# Patient Record
Sex: Male | Born: 1950 | Race: White | Hispanic: No | Marital: Married | Smoking: Never smoker
Health system: Southern US, Community
[De-identification: ages and names within clinical notes are randomized; demographics above are authoritative.]

## PROBLEM LIST (undated history)

## (undated) DIAGNOSIS — E785 Hyperlipidemia, unspecified: Secondary | ICD-10-CM

## (undated) DIAGNOSIS — I10 Essential (primary) hypertension: Secondary | ICD-10-CM

## (undated) DIAGNOSIS — C61 Malignant neoplasm of prostate: Secondary | ICD-10-CM

## (undated) HISTORY — DX: Hyperlipidemia, unspecified: E78.5

## (undated) HISTORY — DX: Malignant neoplasm of prostate: C61

## (undated) HISTORY — DX: Essential (primary) hypertension: I10

---

## 1996-09-15 HISTORY — PX: WRIST SURGERY: SHX841

## 2004-09-15 HISTORY — PX: COLONOSCOPY: SHX174

## 2005-09-15 HISTORY — PX: CERVICAL FUSION: SHX112

## 2006-04-23 ENCOUNTER — Encounter: Payer: Self-pay | Admitting: Neurosurgery

## 2006-05-08 ENCOUNTER — Ambulatory Visit (HOSPITAL_COMMUNITY): Admission: RE | Admit: 2006-05-08 | Discharge: 2006-05-09 | Payer: Self-pay | Admitting: Neurosurgery

## 2006-09-15 DIAGNOSIS — C61 Malignant neoplasm of prostate: Secondary | ICD-10-CM

## 2006-09-15 HISTORY — DX: Malignant neoplasm of prostate: C61

## 2007-06-21 ENCOUNTER — Ambulatory Visit: Payer: Self-pay | Admitting: Internal Medicine

## 2007-06-25 ENCOUNTER — Encounter: Payer: Self-pay | Admitting: Internal Medicine

## 2007-06-25 ENCOUNTER — Encounter (INDEPENDENT_AMBULATORY_CARE_PROVIDER_SITE_OTHER): Payer: Self-pay | Admitting: *Deleted

## 2007-06-25 LAB — CONVERTED CEMR LAB
Calcium: 9.6 mg/dL (ref 8.4–10.5)
Creatinine, Ser: 1 mg/dL (ref 0.4–1.5)
Potassium: 3.7 meq/L (ref 3.5–5.1)
Sodium: 141 meq/L (ref 135–145)

## 2007-06-28 ENCOUNTER — Encounter (INDEPENDENT_AMBULATORY_CARE_PROVIDER_SITE_OTHER): Payer: Self-pay | Admitting: *Deleted

## 2007-07-15 ENCOUNTER — Encounter (INDEPENDENT_AMBULATORY_CARE_PROVIDER_SITE_OTHER): Payer: Self-pay | Admitting: *Deleted

## 2007-07-16 ENCOUNTER — Encounter: Payer: Self-pay | Admitting: Internal Medicine

## 2007-07-17 ENCOUNTER — Encounter: Payer: Self-pay | Admitting: Internal Medicine

## 2007-08-02 ENCOUNTER — Ambulatory Visit: Payer: Self-pay | Admitting: Internal Medicine

## 2007-08-02 DIAGNOSIS — I152 Hypertension secondary to endocrine disorders: Secondary | ICD-10-CM | POA: Insufficient documentation

## 2007-08-02 DIAGNOSIS — I10 Essential (primary) hypertension: Secondary | ICD-10-CM | POA: Insufficient documentation

## 2007-08-02 DIAGNOSIS — R9431 Abnormal electrocardiogram [ECG] [EKG]: Secondary | ICD-10-CM | POA: Insufficient documentation

## 2007-08-09 ENCOUNTER — Encounter: Payer: Self-pay | Admitting: Internal Medicine

## 2007-08-18 ENCOUNTER — Telehealth: Payer: Self-pay | Admitting: Internal Medicine

## 2007-08-19 ENCOUNTER — Ambulatory Visit: Payer: Self-pay | Admitting: Cardiology

## 2007-08-23 ENCOUNTER — Ambulatory Visit (HOSPITAL_COMMUNITY): Admission: RE | Admit: 2007-08-23 | Discharge: 2007-08-23 | Payer: Self-pay | Admitting: Urology

## 2007-08-30 ENCOUNTER — Ambulatory Visit: Payer: Self-pay

## 2007-08-30 ENCOUNTER — Encounter: Payer: Self-pay | Admitting: Internal Medicine

## 2007-08-30 ENCOUNTER — Encounter: Payer: Self-pay | Admitting: Cardiology

## 2007-11-14 HISTORY — PX: PROSTATECTOMY: SHX69

## 2009-01-09 ENCOUNTER — Ambulatory Visit: Payer: Self-pay | Admitting: Internal Medicine

## 2009-01-09 DIAGNOSIS — Z8546 Personal history of malignant neoplasm of prostate: Secondary | ICD-10-CM

## 2009-01-09 DIAGNOSIS — E785 Hyperlipidemia, unspecified: Secondary | ICD-10-CM

## 2009-01-09 DIAGNOSIS — E1169 Type 2 diabetes mellitus with other specified complication: Secondary | ICD-10-CM | POA: Insufficient documentation

## 2009-01-09 LAB — CONVERTED CEMR LAB
ALT: 47 units/L (ref 0–53)
Basophils Absolute: 0 10*3/uL (ref 0.0–0.1)
Eosinophils Absolute: 0.1 10*3/uL (ref 0.0–0.7)
Eosinophils Relative: 2.2 % (ref 0.0–5.0)
GFR calc non Af Amer: 92.19 mL/min (ref >60)
HCT: 47.9 % (ref 39.0–52.0)
Hemoglobin: 16.6 g/dL (ref 13.0–17.0)
MCHC: 34.6 g/dL (ref 30.0–36.0)
Monocytes Absolute: 0.6 10*3/uL (ref 0.1–1.0)
Monocytes Relative: 10 % (ref 3.0–12.0)
Neutrophils Relative %: 64.9 % (ref 43.0–77.0)
Platelets: 233 10*3/uL (ref 150.0–400.0)
RBC: 5.7 M/uL (ref 4.22–5.81)
TSH: 0.81 microintl units/mL (ref 0.35–5.50)

## 2009-01-10 ENCOUNTER — Encounter (INDEPENDENT_AMBULATORY_CARE_PROVIDER_SITE_OTHER): Payer: Self-pay | Admitting: *Deleted

## 2010-01-18 ENCOUNTER — Ambulatory Visit: Payer: Self-pay | Admitting: Internal Medicine

## 2010-01-18 DIAGNOSIS — R059 Cough, unspecified: Secondary | ICD-10-CM | POA: Insufficient documentation

## 2010-01-18 DIAGNOSIS — R05 Cough: Secondary | ICD-10-CM

## 2010-01-21 ENCOUNTER — Ambulatory Visit: Payer: Self-pay | Admitting: Internal Medicine

## 2010-01-27 LAB — CONVERTED CEMR LAB
ALT: 76 units/L — ABNORMAL HIGH (ref 0–53)
Albumin: 4.1 g/dL (ref 3.5–5.2)
Bilirubin, Direct: 0.1 mg/dL (ref 0.0–0.3)
Eosinophils Relative: 4.8 % (ref 0.0–5.0)
HCT: 46.8 % (ref 39.0–52.0)
HDL: 41.6 mg/dL (ref >39.00)
Hemoglobin: 16.3 g/dL (ref 13.0–17.0)
Lymphocytes Relative: 23.7 % (ref 12.0–46.0)
Lymphs Abs: 1.4 10*3/uL (ref 0.7–4.0)
MCV: 85.2 fL (ref 78.0–100.0)
Monocytes Absolute: 0.6 10*3/uL (ref 0.1–1.0)
Monocytes Relative: 10.3 % (ref 3.0–12.0)
Neutrophils Relative %: 61 % (ref 43.0–77.0)
TSH: 0.67 microintl units/mL (ref 0.35–5.50)
Total CHOL/HDL Ratio: 5
Triglycerides: 219 mg/dL — ABNORMAL HIGH (ref 0.0–149.0)
VLDL: 43.8 mg/dL — ABNORMAL HIGH (ref 0.0–40.0)
WBC: 5.9 10*3/uL (ref 4.5–10.5)

## 2010-10-15 NOTE — Assessment & Plan Note (Signed)
Summary: CPX//PH   Vital Signs:  Patient profile:   60 year old male Height:      69 inches Weight:      234.8 pounds BMI:     34.80 Temp:     98.3 degrees F oral Pulse rate:   64 / minute Resp:     14 per minute BP sitting:   114 / 68  (left arm) Cuff size:   large  Vitals Entered By: Shonna Chock (Jan 18, 2010 2:07 PM)  Comments REVIEWED MED LIST, PATIENT AGREED DOSE AND INSTRUCTION CORRECT    History of Present Illness: Johnny [name] is here for a physical; he is asymptomatic.  Preventive Screening-Counseling & Management  Caffeine-Diet-Exercise     Does Patient Exercise: yes  Allergies (verified): No Known Drug Allergies  Past History:  Past Medical History: Hypertension Rosacea, Dr Inda Merlin Prostate cancer, hx of, Dr [facility] Abnormal EKG, negative Nuclear Stress Test , Dr Jens Som 2008 Hyperlipidemia: NMR : LDL 110(1302/727), TG 161, HDL 42. LDL goal = < 115,ideally <90.  Past Surgical History: Cervical fusion @ C 5-7 , Dr Konrad Dolores bone surgery 1998 Colonoscopy 2006 negative ,[facility] Prostatectomy 11/2007, Dr Carin Primrose, no post op treatment  Family History: Father: prostate nodule Mother: breast  cancer Siblings: none; PGM colon  cancer; PGF DM; MGM breast cancer; MGF MI @ 60  Social History: no diet  Occupation: Stage manager Married Never Smoked Alcohol use-no Regular exercise-yes: walking 3-4X/week 30 min Does Patient Exercise:  yes  Review of Systems  The patient denies anorexia, fever, weight loss, weight gain, vision loss, decreased hearing, hoarseness, chest pain, syncope, dyspnea on exertion, peripheral edema, headaches, hemoptysis, abdominal pain, melena, hematochezia, severe indigestion/heartburn, hematuria, incontinence, suspicious skin lesions, depression, unusual weight change, abnormal bleeding, enlarged lymph nodes, and angioedema.         Dry cough ; he is on an ACE-I.  Physical Exam  General:   well-nourished; alert,appropriate and cooperative throughout examination Head:  Normocephalic and atraumatic without obvious abnormalities. No apparent alopecia Eyes:  No corneal or conjunctival inflammation noted. Perrla. Funduscopic exam benign, without hemorrhages, exudates or papilledema. Ears:  External ear exam shows no significant lesions or deformities.  Otoscopic examination reveals clear canals, tympanic membranes are intact bilaterally without bulging, retraction, inflammation or discharge. Hearing is grossly normal bilaterally. Nose:  External nasal examination shows no deformity or inflammation. Nasal mucosa are pink and moist without lesions or exudates. Septum to R Mouth:  Oral mucosa and oropharynx without lesions or exudates.  Teeth in good repair. Neck:  No deformities, masses, or tenderness noted. Lungs:  Normal respiratory effort, chest expands symmetrically. Lungs are clear to auscultation, no crackles or wheezes. Heart:  Normal rate and regular rhythm. S1 and S2 normal without gallop, murmur, click, rub .S4 Abdomen:  Bowel sounds positive,abdomen soft and non-tender without masses, organomegaly or hernias noted. Genitalia:  Dr Carin Primrose (seen every 6 months, last 12/20/2009) Prostate:  Dr Carin Primrose Msk:  No deformity or scoliosis noted of thoracic or lumbar spine.   Pulses:  R and L carotid,radial,dorsalis pedis and posterior tibial pulses are full and equal bilaterally Extremities:  No clubbing, cyanosis, edema, or deformity noted with normal full range of motion of all joints.  Minor crepitus of knees  Neurologic:  alert & oriented X3 and DTRs symmetrical and 1/2+ Skin:  Intact without suspicious lesions or rashes Cervical Nodes:  No lymphadenopathy noted Axillary Nodes:  No palpable lymphadenopathy Psych:  memory intact for recent  and remote, normally interactive, and good eye contact.     Impression & Recommendations:  Problem # 1:  ROUTINE GENERAL MEDICAL EXAM@HEALTH  CARE  FACL (ICD-V70.0)  Orders: EKG w/ Interpretation (93000)  Problem # 2:  HYPERLIPIDEMIA (ICD-272.4)  Problem # 3:  ABNORMAL ELECTROCARDIOGRAM (ICD-794.31)  Improved since  01/09/2009   Orders: EKG w/ Interpretation (93000)  Problem # 4:  COUGH (ICD-786.2) Due to ACE-I  Problem # 5:  HYPERTENSION, ESSENTIAL NOS (ICD-401.9) Controlled.                                                                                                                                                                                                                   The following medications were removed from the medication list:    Benazepril Hcl 20 Mg Tabs (Benazepril hcl) .Marland Kitchen... 1 once daily His updated medication list for this problem includes:    Cardura 4 Mg Tabs (Doxazosin mesylate) .Marland Kitchen... 1/2 tab qd    Hydrochlorothiazide 12.5 Mg Caps (Hydrochlorothiazide) .Marland Kitchen... 1 qd    Losartan Potassium 100 Mg Tabs (Losartan potassium) .Marland Kitchen... 1 once daily  Problem # 6:  PROSTATE CANCER, HX OF (ICD-V10.46) as per Dr Carin Primrose  Complete Medication List: 1)  Cardura 4 Mg Tabs (Doxazosin mesylate) .... 1/2 tab qd 2)  Hydrochlorothiazide 12.5 Mg Caps (Hydrochlorothiazide) .Marland Kitchen.. 1 qd 3)  Oracea 40 Mg Cpdr (Doxycycline (rosacea)) .Marland Kitchen.. 1 by mouth once daily 4)  Losartan Potassium 100 Mg Tabs (Losartan potassium) .Marland Kitchen.. 1 once daily  Patient Instructions: 1)  Consume LESS THAN 40 grams of "sugar" from foods & drinks with High Fructose Corn Syrup as #1,2  or #3 on label. Schedule fastig labs:BMP ;Hepatic Panel ;Lipid Panel ;TSH ;CBC w/ Diff . 2)  Check your Blood Pressure regularly. Your goal = < 135/85 ON AVERAGE. Prescriptions: HYDROCHLOROTHIAZIDE 12.5 MG  CAPS (HYDROCHLOROTHIAZIDE) 1 qd  #90 Capsule x 3   Entered and Authorized by:   Marga Melnick MD   Signed by:   Marga Melnick MD on 01/18/2010   Method used:   Print then Give to Patient   RxID:   [id number] CARDURA 4 MG  TABS (DOXAZOSIN MESYLATE) 1/2 tab qd  #90 x  1   Entered and Authorized by:   Marga Melnick MD   Signed by:   Marga Melnick MD on 01/18/2010   Method used:   Print then Give to Patient   RxID:   [id number] LOSARTAN POTASSIUM 100 MG TABS (LOSARTAN POTASSIUM) 1 once daily  #30  x 5   Entered and Authorized by:   Marga Melnick MD   Signed by:   Marga Melnick MD on 01/18/2010   Method used:   Print then Give to Patient   RxID:   [id number]

## 2011-01-22 ENCOUNTER — Encounter: Payer: Self-pay | Admitting: Internal Medicine

## 2011-01-24 ENCOUNTER — Encounter: Payer: Self-pay | Admitting: Internal Medicine

## 2011-01-24 ENCOUNTER — Ambulatory Visit (INDEPENDENT_AMBULATORY_CARE_PROVIDER_SITE_OTHER): Payer: BC Managed Care – PPO | Admitting: Internal Medicine

## 2011-01-24 VITALS — BP 128/86 | HR 60 | Temp 98.5°F | Resp 12 | Ht 70.0 in | Wt 229.8 lb

## 2011-01-24 DIAGNOSIS — Z8546 Personal history of malignant neoplasm of prostate: Secondary | ICD-10-CM

## 2011-01-24 DIAGNOSIS — R7309 Other abnormal glucose: Secondary | ICD-10-CM

## 2011-01-24 DIAGNOSIS — E785 Hyperlipidemia, unspecified: Secondary | ICD-10-CM

## 2011-01-24 DIAGNOSIS — Z0000 Encounter for general adult medical examination without abnormal findings: Secondary | ICD-10-CM

## 2011-01-24 DIAGNOSIS — I10 Essential (primary) hypertension: Secondary | ICD-10-CM

## 2011-01-24 DIAGNOSIS — R7402 Elevation of levels of lactic acid dehydrogenase (LDH): Secondary | ICD-10-CM

## 2011-01-24 DIAGNOSIS — R9431 Abnormal electrocardiogram [ECG] [EKG]: Secondary | ICD-10-CM

## 2011-01-24 LAB — AST: AST: 36 U/L (ref 0–37)

## 2011-01-24 LAB — LIPID PANEL: Cholesterol: 190 mg/dL (ref 0–200)

## 2011-01-24 MED ORDER — DOXAZOSIN MESYLATE 4 MG PO TABS: 2.0000 mg | ORAL_TABLET | ORAL | Status: DC

## 2011-01-24 MED ORDER — HYDROCHLOROTHIAZIDE 12.5 MG PO CAPS: 12.5000 mg | ORAL_CAPSULE | Freq: Every day | ORAL | Status: DC

## 2011-01-24 MED ORDER — LOSARTAN POTASSIUM 100 MG PO TABS: 100.0000 mg | ORAL_TABLET | Freq: Every day | ORAL | Status: DC

## 2011-01-24 NOTE — Assessment & Plan Note (Signed)
NMR Lipoprofile 2010: LDL 110(1302/727), HDL 42, TG 161. LDL goal = <110. PGF  & MGF MI > 55.

## 2011-01-24 NOTE — Progress Notes (Signed)
  Subjective:    Patient ID: Johnny [name], male    DOB: 18-May-1951, 60 y.o.   MRN: [id number]  HPI Johnny [name] is here for a physical ; he is asymptomatic. labsdone @ [facility] 01/09/2011 : mild elevation LFTs; glucose 127(non fasting). PSA 0.3.    Review of Systems Patient reports no  vision/ hearing changes,anorexia, weight change, fever ,adenopathy, persistant / recurrent hoarseness, swallowing issues, chest pain,palpitations, edema,persistant / recurrent cough, hemoptysis, dyspnea(rest, exertional, paroxysmal nocturnal), gastrointestinal  bleeding (melena, rectal bleeding), abdominal pain, excessive heart burn, GU symptoms( dysuria, hematuria, pyuria, voiding/incontinence  issues) syncope, focal weakness, memory loss,numbness & tingling, skin/hair/nail changes,depression, anxiety, abnormal bruising/bleeding, musculoskeletal symptoms/signs.      Objective:   Physical Exam Gen.: Healthy and well-nourished in appearance. Alert, appropriate and cooperative throughout exam. Head: Normocephalic without obvious abnormalities;  no alopecia  Eyes: No corneal or conjunctival inflammation noted. Pupils equal round reactive to light and accommodation. Fundal exam is benign without hemorrhages, exudate, papilledema. Extraocular motion intact. Vision grossly normal. Ears: External  ear exam reveals no significant lesions or deformities. Canals clear .TMs normal. Hearing is grossly normal bilaterally. Nose: External nasal exam reveals no deformity or inflammation. Nasal mucosa are pink and moist. No lesions or exudates noted. Septum   Slightly deviated to R  Mouth: Oral mucosa and oropharynx reveal no lesions or exudates. Teeth in good repair. Neck: No deformities, masses, or tenderness noted. Range of motion  normal. Thyroid  normal. Lungs: Normal respiratory effort; chest expands symmetrically. Lungs are clear to auscultation without rales, wheezes, or increased work of breathing. Heart: Normal rate and rhythm.  Normal S1 and S2. No gallop, click, or rub. No  murmur. Abdomen: Bowel sounds normal; abdomen soft and nontender. No masses, organomegaly or hernias noted. Genitalia:  Dr [facility].  Musculoskeletal/extremities: No deformity or scoliosis noted of  the thoracic or lumbar spine. No clubbing, cyanosis, edema, or deformity noted. Range of motion  normal .Tone & strength  normal.Joints normal. Nail health  good. Vascular: Carotid, radial artery, dorsalis pedis and dorsalis posterior tibial pulses are full and equal. No bruits present. Neurologic: Alert and oriented x3. Deep tendon reflexes symmetrical and normal.         Skin: Intact without suspicious lesions or rashes. Lymph: No cervical, axillary, or inguinal lymphadenopathy present. Psych: Mood and affect are normal. Normally interactive                                                                                         Assessment & Plan:  #1 comprehensive physical exam; no acute issues  #2 dyslipidemia, major risk related to hypertriglyceridemia  #3 liver function test elevation, mild. This is most likely related to #2  #4 hypertension, controlled  #5 hypoglycemia, nonfasting  Plan: A1c, fasting lipids, AST/ALT.  Avoidance of sugar from high fructose corn syrup sources  was discussed.

## 2011-01-24 NOTE — Assessment & Plan Note (Signed)
Diffuse NS ST-T changes

## 2011-01-24 NOTE — Assessment & Plan Note (Signed)
Dr Eldridge Abrahams, [facility]

## 2011-01-24 NOTE — Patient Instructions (Signed)
Preventive Health Care: Exercise at least 30-45 minutes a day,  3-4 days a week.  Eat a low-fat diet with lots of fruits and vegetables, up to 7-9 servings per day. Avoid obesity; your goal is waist measurement < 40 inches. This pattern of high Triglycerides (TG)  & elevated liver function tests is almost always due to excess intake of sugar from High Fructose Corn Syrup added to foods & drinks.HFCS is converted into TG (essentially liquid fat) by the liver. As TG go up ,the HDL(GOOD cholesterol)  goes down & VLDL (BAD cholesterol particles  which  cause very unstable plaque in arteries) goes up.After all intra-abdominal binding sites are saturated ,  TG then enter the liver . TG infiltrate around the liver cells , compressing them & causing release of AST & ALT, liver enzymes. This condition is called hepatic steatosis or  "fatty liver". You should consume as little HFCS sugar as possible, but @ least < 40 grams per day from foods & drinks with HFCS as #2,3, or #4 on the label.

## 2011-01-28 NOTE — Assessment & Plan Note (Signed)
[facility]                            CARDIOLOGY OFFICE NOTE   INDIE, NICKERSON                       MRN:          [id number]  DATE:08/19/2007                            DOB:          06/16/1951    HISTORY:  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  He has  no prior cardiac history.  There is no history of dyspnea on exertion,  orthopnea, PND, pedal edema, palpitations, presyncope, syncope or chest  pain.  He was seen by Dr. Alwyn Ren on August 02, 2007.  He was noted to  have increasing ST changes.  Cardiology is therefore asked to further  evaluate.   PAST MEDICAL HISTORY:  1. Significant for hypertension, but there is no diabetes mellitus or      hyperlipidemia by his report.  2. He has had previous cervical disk surgery/cervical fusion.  3. He has also had hand surgery.  4. The patient was recently diagnosed with prostate cancer.   SOCIAL HISTORY:  He does not smoke nor does he consume alcohol.   FAMILY HISTORY:  Negative for coronary artery disease in his immediate  family.   ALLERGIES:  NO KNOWN DRUG ALLERGIES.   MEDICATIONS:  1. Doxazosin 2 mg p.o. daily.  2. Hydrochlorothiazide 12.5 mg p.o. daily.   REVIEW OF SYSTEMS:  The patient denies any headaches, fevers or chills.  There is no productive cough or hemoptysis.  There is no dysphagia,  odynophagia, melena or hematochezia.  There is no dysuria or hematuria.  There is no seizure activity.  There is no orthopnea, PND, pedal edema.  There is no claudication noted.  The remaining systems are negative.   PHYSICAL EXAMINATION:  VITAL SIGNS:  Blood pressure is mildly elevated  at 150/90, pulse 64.  He weighs 218 pounds.  GENERAL:  He is well-developed, well-nourished in no acute distress.  He  does not seem to be depressed.  SKIN:  Warm and dry.  EXTREMITIES:  There is no peripheral clubbing.  He has  2+ femoral pulses  bilaterally.  No bruits.  Extremities show no edema.  I can palpate no  cords.  He has 2+ posterior tibial pulses bilaterally.  BACK:  Normal.  HEENT:  Normal with normal eyelids.  NECK:  Supple within normal upstroke bilaterally.  No bruits noted.  There is no jugular venous distention.  No thyromegaly noted.  CHEST:  Clear to auscultation with normal expansion.  CARDIOVASCULAR:  Reveals a regular rate and rhythm, normal S1 and S2.  There are no murmurs, rubs or gallops noted.  ABDOMEN:  Nontender and nondistended.  Positive bowel sounds.  No  hepatosplenomegaly.  No mass appreciated.  There is no abdominal bruit.  NEUROLOGIC:  Grossly intact.   DIAGNOSTICS:  Electrocardiogram shows a sinus rhythm at a rate of 64.  There are nonspecific inferolateral T-wave changes.   DIAGNOSES:  1. Abnormal electrocardiogram.  He has nonspecific ST changes, but      really has no cardiac symptoms.  However, he had recently been  diagnosed with prostate cancer and is undergoing a metastatic      evaluation at this point.  He may require prostatectomy or surgical      seed implants in the future.  We will plan to proceed with a stress      echocardiogram both to quantify his LV function and to exclude      ischemia, both for his  abnormal electrocardiogram and also as a      preoperative evaluation.  If he shows no ischemia, then we will not      pursue further cardiac workup.  2. Preoperative evaluation as per number 1.  3. Hypertension.  His blood pressure is mildly elevated today.  He      will track this and we will add additional medications as needed.      He will follow up with Dr. Alwyn Ren concerning this issue.  4. Prostate cancer.  He is scheduled to see urology for further      evaluation.   FOLLOW UP:  I will see him back on as needed basis if his stress  echocardiogram is normal.     Madolyn Frieze. Jens Som, MD, [facility]  Electronically Signed    BSC/MedQ  DD:  08/19/2007  DT: 08/19/2007  Job #: [id number]   cc:   Titus Dubin. Alwyn Ren, MD,FACP,FCCP

## 2011-01-29 ENCOUNTER — Encounter: Payer: Self-pay | Admitting: Internal Medicine

## 2011-01-31 ENCOUNTER — Other Ambulatory Visit: Payer: Self-pay

## 2011-01-31 DIAGNOSIS — I10 Essential (primary) hypertension: Secondary | ICD-10-CM

## 2011-01-31 MED ORDER — LOSARTAN POTASSIUM 100 MG PO TABS: 100.0000 mg | ORAL_TABLET | Freq: Every day | ORAL | Status: DC

## 2011-01-31 NOTE — Op Note (Addendum)
NAMERILYN, UPSHAW NO.:  [id number]   MEDICAL RECORD NO.:  [id number]          PATIENT TYPE:  OIB   LOCATION:  3027                         FACILITY:  [facility]   PHYSICIAN:  Donalee Citrin, M.D.        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   PROCEDURE:  Anterior cervical diskectomy with fusion of C5-C6 to C6-7 using  8-mm Synthes allograft wedges and a 45-mm Venture plate __________ with six  30-mm screws.   SURGEON:  Donalee Citrin, M.D.   ASSISTANT:  Kritzer   ANESTHESIA:  General.   HISTORY OF PRESENT ILLNESS:  The patient is a very pleasant 60 year old  gentleman who has had neck and right arm pain with weakness in his triceps  as well as some scapular winging that has been going on for the last several  weeks and months.  The patient failed all attempts at conservative  treatment.  Imaging showed severe spondylitic compression of the C6 and C7  nerve roots on the right from cervical spondylosis with a ruptured disk.  The patient was recommended anterior cervical diskectomy with fusion.  Risks  and benefits were explained to the patient.   PROCEDURE PERFORMED:  The patient was brought to the OR and received general  anesthesia.  The patient was placed supine.  The neck was placed in  extension with 5 pounds of __________  traction.  The right side was prepped  and draped in the usual sterile fashion.  Preop x-ray localized the C6  vertebral body.  A curvilinear incision was made just off the midline at the  anterior portion of sternocleidomastoid.  The superficial layer of the  platysma was dissected out and divided longitudinally.  The ____ QA MARKER:                                                   66____ was taken through the sternocleidomastoid and strap mus             QA MARKER: 67____ fascia.  ____ QA MARKER____        QA MARKER: 68____.  Intraoperative x-ray confirmed localization of C5-6    disk space.  Annulotomy was ____ QA MARKER: 73 ____ 15-bl  mark the disk space, then the longus colli was reflected laterally, and a  self-retaining retractor was placed.  ____ QA MARKER: 78  ____ annul  incised with a 10-blade scalpel, and a high-speed drill was used to drill  down both interspaces down the posterior annulus posterior complexes.  At  this point, the operative microscope was draped and brought into the field  for microscopic illumination.  First the C6-7, the undersurface of the C6  vertebral body was underbitten, removing the posterior  annulus and posterior  longitudinal ligament.  There was noted to be a very large osteophyte coming  out the posterior aspect of C6 vertebral body displacing the thecal sac  bilaterally, both left and right.  This was all underbitten, which exposed  the thecal sac, and the proximal left C6 neuroforamen was identified.  Then  attention was  ____ QA MARKER: 107 ____to the  root was noted to be remarkably stenotic from uncinate hypertrophy and  osteophyte coming off the C6 and C7 vertebral bodies.  These were all  underbitten and decompressed.  The right C7 nerve root and the right C7  nerve were skeletonized distally at its foramen.  The right C7 pedicle was  easily palpated and ____ QA MARKER: 122 ____ appea  when I explored the ____ QA MARKER: 125 ____.  At   were was scraped, Gelfoam was placed, attention was taken to C5-6.  At C5-6,  the procedure was repeated.  Very large osteophyte coming off the C5  vertebral body and displacement in the thecal sac were seen compressing the  right C6 nerve root.  This was all underbitten with a 2 and 2.5-Kerrison  punch, and the right C6 nerve root was skeletonized significantly at its  foramen.  Explored the ____ QA MARKER: 144 ____ no fu   procedure was repeated ____ QA MARKER: 146 ____ left   proximal neuroforamen was identified and decompressed, and endplates were  scraped.  Then two 8-mm Synthes autograft wedges were inserted at C5-6 to C6-  7.  All bone grafts had excellent apposition of the endplates.  A 45-mm  ____  QA MARKER: 163 ____ Ventu       QA MARKER: 165____ screws.  Set screw mechanism was engaged.                 The wound was then copiously irrigated and meticulous hemostasis was  maintained.  The platysma was reapproximated with 3-0 interrupted Vicryl,  and the skin was closed with running 4-0 subcuticular.  Benzoin and Steri-  Strips applied.  The patient went to recovery room in stable condition.  At  the end of the case, needle count and sponge count correct.           ______________________________  Donalee Citrin, M.D.     GC/MEDQ  D:  05/08/2006  T:  05/09/2006  Job:  [id number]

## 2012-01-30 ENCOUNTER — Encounter: Payer: BC Managed Care – PPO | Admitting: Internal Medicine

## 2012-02-03 ENCOUNTER — Ambulatory Visit (INDEPENDENT_AMBULATORY_CARE_PROVIDER_SITE_OTHER): Payer: BC Managed Care – PPO | Admitting: Internal Medicine

## 2012-02-03 VITALS — BP 140/82 | HR 69 | Temp 98.2°F | Wt 237.0 lb

## 2012-02-03 DIAGNOSIS — S60459A Superficial foreign body of unspecified finger, initial encounter: Secondary | ICD-10-CM

## 2012-02-03 DIAGNOSIS — Z23 Encounter for immunization: Secondary | ICD-10-CM

## 2012-02-03 NOTE — Progress Notes (Signed)
  Subjective:    Patient ID: Johnny [name], male    DOB: 06/10/1951, 61 y.o.   MRN: [id number]  HPI Acute visit Has a splinter under the nail of the first finger on the right. Incident  happened at home 2 days ago. No bleeding.  Past Medical History  Diagnosis Date  . Hyperlipidemia     LDL goal = < 110  . Hypertension   . Cancer 2008    prostate   Past Surgical History  Procedure Date  . Colonoscopy 2006    negative  . Prostatectomy 11/2007    Dr.Moul  . Wrist surgery 1998    Hammate bone resection 2006  . Cervical fusion     C5-C7 , Dr Wynetta Emery 2007      Review of Systems     Objective:   Physical Exam Exam is confirmatory he has a 4x1 mm splinter under the nail       Assessment & Plan:  Procedure note: I advised the patient to proceed without   regional anesthesia. In a sterile fashion I cut a small piece of the nail, this allowed me to remove the splinter with forceps. Patient tolerated well,  minimal bleeding. See instructions Patient tolerated it well Time spent 15 minutes +

## 2012-02-03 NOTE — Patient Instructions (Signed)
Keep the area clean and dry, use an antibiotic ointment. Call if you see any redness, swelling or discharge , it may represent a infection and you  will need antibiotics.

## 2012-02-04 ENCOUNTER — Encounter: Payer: Self-pay | Admitting: Internal Medicine

## 2012-02-11 ENCOUNTER — Other Ambulatory Visit: Payer: Self-pay | Admitting: Internal Medicine

## 2012-03-11 ENCOUNTER — Other Ambulatory Visit: Payer: Self-pay | Admitting: Internal Medicine

## 2012-04-02 ENCOUNTER — Encounter: Payer: Self-pay | Admitting: Internal Medicine

## 2012-04-02 ENCOUNTER — Ambulatory Visit (INDEPENDENT_AMBULATORY_CARE_PROVIDER_SITE_OTHER): Payer: BC Managed Care – PPO | Admitting: Internal Medicine

## 2012-04-02 VITALS — BP 118/72 | HR 64 | Temp 98.7°F | Resp 12 | Ht 69.0 in | Wt 232.4 lb

## 2012-04-02 DIAGNOSIS — Z0000 Encounter for general adult medical examination without abnormal findings: Secondary | ICD-10-CM

## 2012-04-02 LAB — CBC WITH DIFFERENTIAL/PLATELET
Basophils Relative: 0.2 % (ref 0.0–3.0)
Eosinophils Relative: 2.2 % (ref 0.0–5.0)
Hemoglobin: 15.6 g/dL (ref 13.0–17.0)
Lymphocytes Relative: 27.9 % (ref 12.0–46.0)
Lymphs Abs: 1.8 10*3/uL (ref 0.7–4.0)
MCV: 86.4 fl (ref 78.0–100.0)
Neutro Abs: 3.8 10*3/uL (ref 1.4–7.7)
Neutrophils Relative %: 60 % (ref 43.0–77.0)
Platelets: 229 10*3/uL (ref 150.0–400.0)
RBC: 5.39 Mil/uL (ref 4.22–5.81)
WBC: 6.4 10*3/uL (ref 4.5–10.5)

## 2012-04-02 LAB — BASIC METABOLIC PANEL
CO2: 24 mEq/L (ref 19–32)
Chloride: 107 mEq/L (ref 96–112)
GFR: 97.39 mL/min (ref >60.00)
Glucose, Bld: 114 mg/dL — ABNORMAL HIGH (ref 70–99)

## 2012-04-02 LAB — LIPID PANEL
Cholesterol: 187 mg/dL (ref 0–200)
HDL: 41.8 mg/dL (ref >39.00)
Total CHOL/HDL Ratio: 4
Triglycerides: 206 mg/dL — ABNORMAL HIGH (ref 0.0–149.0)

## 2012-04-02 LAB — TSH: TSH: 0.91 u[IU]/mL (ref 0.35–5.50)

## 2012-04-02 LAB — HEPATIC FUNCTION PANEL
ALT: 116 U/L — ABNORMAL HIGH (ref 0–53)
Total Protein: 7 g/dL (ref 6.0–8.3)

## 2012-04-02 NOTE — Patient Instructions (Addendum)
Preventive Health Care: Exercise at least 30-45 minutes a day,  3-4 days a week.  Eat a low-fat diet with lots of fruits and vegetables, up to 7-9 servings per day.  Consume less than 40 grams of sugar per day from foods & drinks with High Fructose Corn Sugar as #1,2,3 or # 4 on label. Monitor Blood Pressure  OFF Cardura;Goal  Ideally is an AVERAGE < 135/85. This AVERAGE should be calculated from @ least 5-7 BP readings taken @ different times of day on different days of week. You should not respond to isolated BP readings , but rather the AVERAGE for that week  Please try to go on My Chart within the next 24 hours to allow me to release the results directly to you.

## 2012-04-02 NOTE — Progress Notes (Signed)
Subjective:    Patient ID: Johnny [name], male    DOB: 1951/08/26, 61 y.o.   MRN: [id number]  HPI  Johnny [name] is here for a physical; he denies active or acute symptoms.      Review of Systems Patient reports no  vision/ hearing changes,anorexia, weight change, fever ,adenopathy, persistant / recurrent hoarseness, swallowing issues, chest pain,palpitations, edema,persistant / recurrent cough, hemoptysis, dyspnea(rest, exertional, paroxysmal nocturnal), gastrointestinal  bleeding (melena, rectal bleeding), abdominal pain, excessive heart burn, GU symptoms( dysuria, hematuria, pyuria, voiding/incontinence  issues) syncope, focal weakness, memory loss, skin/hair/nail changes,depression, anxiety, abnormal bruising/bleeding,or  musculoskeletal symptoms/signs.   He has residual numbness in the right index finger since his cervical fusion. When playing golf he'll notice localized pain in the right calf when he squats to line up his putt. When he rolls onto the right lateral decubitus position at night he will have some reflux symptoms. He is being followed at [facility] every 6 months for PSA monitor     Objective:   Physical Exam Gen.: Healthy and well-nourished in appearance. Alert, appropriate and cooperative throughout exam. Head: Normocephalic without obvious abnormalities;  no alopecia  Eyes: No corneal or conjunctival inflammation noted. Pupils equal round reactive to light and accommodation. Fundal exam is benign without hemorrhages, exudate, papilledema. Extraocular motion intact. Vision grossly normal. Pterygia present OS Ears: External  ear exam reveals no significant lesions or deformities. Canals clear .TMs normal. Hearing is grossly normal bilaterally. Nose: External nasal exam reveals no deformity or inflammation. Nasal mucosa are pink and moist. No lesions or exudates noted. Septum deviated slightly to the right  Mouth: Oral mucosa and oropharynx reveal no lesions or exudates. He has mild  erythema of the uvula and posterior oropharynx. Teeth in good repair. Neck: No deformities, masses, or tenderness noted. Range of motion & Thyroid normal. Lungs: Normal respiratory effort; chest expands symmetrically. Lungs are clear to auscultation without rales, wheezes, or increased work of breathing. Heart: Normal rate and rhythm. Normal S1 and S2. No gallop, click, or rub. S4 w/o murmur. Abdomen: Bowel sounds normal; abdomen soft and nontender. No masses, organomegaly or hernias noted. Genitalia: Dr Carin Primrose                                                                      Musculoskeletal/extremities: No deformity or scoliosis noted of  the thoracic or lumbar spine. No clubbing, cyanosis, edema, or deformity noted. Range of motion  normal .Tone & strength  normal.Joints normal. Nail health  good. Vascular: Carotid, radial artery, dorsalis pedis and  posterior tibial pulses are full and equal. No bruits present. Venous spiders present over the lower extremities. Neurologic: Alert and oriented x3. Deep tendon reflexes symmetrical and 1/2+ @ knees.          Skin: Intact without suspicious lesions or rashes. He has an verrucoid lesion of the mid back with central plugging Lymph: No cervical, axillary lymphadenopathy present. Psych: Mood and affect are normal. Normally interactive  Assessment & Plan:  #1 comprehensive physical exam; no acute findings #2 see Problem List with Assessments & Recommendations Plan: see Orders

## 2012-05-12 ENCOUNTER — Other Ambulatory Visit: Payer: Self-pay | Admitting: Internal Medicine

## 2012-06-13 ENCOUNTER — Other Ambulatory Visit: Payer: Self-pay | Admitting: Internal Medicine

## 2012-06-14 NOTE — Telephone Encounter (Signed)
Rx sent.    MW 

## 2012-09-14 ENCOUNTER — Other Ambulatory Visit: Payer: Self-pay | Admitting: Internal Medicine

## 2012-09-16 ENCOUNTER — Other Ambulatory Visit: Payer: Self-pay | Admitting: Internal Medicine

## 2012-10-30 ENCOUNTER — Other Ambulatory Visit: Payer: Self-pay

## 2013-03-15 ENCOUNTER — Other Ambulatory Visit: Payer: Self-pay | Admitting: Internal Medicine

## 2013-03-15 NOTE — Telephone Encounter (Signed)
APPOINTMENT DUE

## 2013-03-17 ENCOUNTER — Other Ambulatory Visit: Payer: Self-pay | Admitting: Internal Medicine

## 2013-04-12 ENCOUNTER — Ambulatory Visit: Payer: BC Managed Care – PPO

## 2013-04-12 ENCOUNTER — Encounter: Payer: Self-pay | Admitting: Internal Medicine

## 2013-04-12 ENCOUNTER — Other Ambulatory Visit: Payer: BC Managed Care – PPO

## 2013-04-12 ENCOUNTER — Ambulatory Visit (INDEPENDENT_AMBULATORY_CARE_PROVIDER_SITE_OTHER): Payer: BC Managed Care – PPO | Admitting: Internal Medicine

## 2013-04-12 VITALS — BP 124/80 | HR 69 | Temp 98.3°F | Resp 12 | Ht 69.03 in | Wt 235.0 lb

## 2013-04-12 DIAGNOSIS — Z0000 Encounter for general adult medical examination without abnormal findings: Secondary | ICD-10-CM

## 2013-04-12 DIAGNOSIS — I10 Essential (primary) hypertension: Secondary | ICD-10-CM

## 2013-04-12 DIAGNOSIS — R9431 Abnormal electrocardiogram [ECG] [EKG]: Secondary | ICD-10-CM

## 2013-04-12 LAB — CBC WITH DIFFERENTIAL/PLATELET
Basophils Relative: 0.2 % (ref 0.0–3.0)
Eosinophils Absolute: 0.2 10*3/uL (ref 0.0–0.7)
Eosinophils Relative: 2.5 % (ref 0.0–5.0)
HCT: 44.3 % (ref 39.0–52.0)
Hemoglobin: 15 g/dL (ref 13.0–17.0)
MCHC: 33.8 g/dL (ref 30.0–36.0)
MCV: 87 fl (ref 78.0–100.0)
Monocytes Absolute: 0.8 10*3/uL (ref 0.1–1.0)
Neutro Abs: 4.7 10*3/uL (ref 1.4–7.7)
Neutrophils Relative %: 65.1 % (ref 43.0–77.0)
RBC: 5.09 Mil/uL (ref 4.22–5.81)
WBC: 7.3 10*3/uL (ref 4.5–10.5)

## 2013-04-12 LAB — LIPID PANEL
Total CHOL/HDL Ratio: 5
Triglycerides: 177 mg/dL — ABNORMAL HIGH (ref 0.0–149.0)

## 2013-04-12 LAB — BASIC METABOLIC PANEL
CO2: 27 mEq/L (ref 19–32)
Chloride: 103 mEq/L (ref 96–112)
Creatinine, Ser: 0.9 mg/dL (ref 0.4–1.5)
Potassium: 3.4 mEq/L — ABNORMAL LOW (ref 3.5–5.1)
Sodium: 137 mEq/L (ref 135–145)

## 2013-04-12 LAB — TSH: TSH: 1.19 u[IU]/mL (ref 0.35–5.50)

## 2013-04-12 LAB — HEPATIC FUNCTION PANEL
ALT: 139 U/L — ABNORMAL HIGH (ref 0–53)
Albumin: 3.9 g/dL (ref 3.5–5.2)
Bilirubin, Direct: 0 mg/dL (ref 0.0–0.3)
Total Protein: 7 g/dL (ref 6.0–8.3)

## 2013-04-12 MED ORDER — LOSARTAN POTASSIUM 100 MG PO TABS: ORAL_TABLET | ORAL | Status: DC

## 2013-04-12 MED ORDER — HYDROCHLOROTHIAZIDE 12.5 MG PO CAPS: ORAL_CAPSULE | ORAL | Status: DC

## 2013-04-12 NOTE — Patient Instructions (Addendum)
Preventive Health Care: Exercise at least 30-45 minutes a day,  3-4 days a week once cleared by Dr Jens Som.  Eat a low-fat diet with lots of fruits and vegetables, up to 7-9 servings per day. This would eliminate the need for vitamin supplements. Consume less than 40 grams of sugar (preferably ZERO) per day from foods & drinks with High Fructose Corn Sugar as #1,2,3 or # 4 on label. Take the EKG to any emergency room or preop visits. There are nonspecific changes; as long as there is no new change these are not clinically significant . If the old EKG is not available for comparison; it may result in unnecessary hospitalization for observation with significant unnecessary expense.

## 2013-04-12 NOTE — Progress Notes (Signed)
  Subjective:    Patient ID: Johnny [name], male    DOB: Jun 30, 1951, 62 y.o.   MRN: [id number]  HPI  Johnny [name] is here for a physical;acute issues denied     Review of Systems He is on a modified heart healthy diet; he exercises as walking golf course  3 times per week without symptoms. Specifically he denies chest pain, palpitations, dyspnea, or claudication. Family history is negative for premature coronary disease. Advanced cholesterol testing reveals his LDL goal was less than 110. BP @ home 120- 130/75-85 . No med adverse effects.     Objective:   Physical Exam  Gen.: Healthy and well-nourished in appearance. Alert, appropriate and cooperative throughout exam.Appears younger than stated age  Head: Normocephalic without obvious abnormalities; no alopecia  Eyes: No corneal or conjunctival inflammation noted.  Extraocular motion intact. Vision grossly normal without lenses Ears: External  ear exam reveals no significant lesions or deformities. Canals clear .TMs normal. Hearing is grossly normal bilaterally. Nose: External nasal exam reveals no deformity or inflammation. Nasal mucosa are pink and moist. No lesions or exudates noted. Septum slightly deviated to R Mouth: Oral mucosa and oropharynx reveal no lesions or exudates. Teeth in good repair. Neck: No deformities, masses, or tenderness noted. Range of motion & Thyroid normal. Lungs: Normal respiratory effort; chest expands symmetrically. Lungs are clear to auscultation without rales, wheezes, or increased work of breathing. Heart: Normal rate and rhythm. Normal S1 and S2. No gallop, click, or rub. S4 w/o murmur. Abdomen: Bowel sounds normal; abdomen soft and nontender. No masses, organomegaly or hernias noted. Genitalia:As per Dr Carin Primrose                                  Musculoskeletal/extremities: No deformity or scoliosis noted of  the thoracic or lumbar spine.  No clubbing, cyanosis, edema, or significant extremity  deformity noted.  Range of motion normal .Tone & strength  Normal. Joints normal . Nail health good. Able to lie down & sit up w/o help. Negative SLR bilaterally Vascular: Carotid, radial artery, dorsalis pedis and  posterior tibial pulses are full and equal. No bruits present. Neurologic: Alert and oriented x3. Deep tendon reflexes symmetrical and normal.         Skin: Intact without suspicious lesions or rashes. Lymph: No cervical, axillary lymphadenopathy present. Psych: Mood and affect are normal. Normally interactive                                                                                        Assessment & Plan:  #1 comprehensive physical exam; no acute findings #2 EKG with progressive ST-T changes  Plan: see Orders  & Recommendations#1 comprehensive physical exam; no acute findings  Plan: see Orders  & Recommendations

## 2013-05-17 ENCOUNTER — Institutional Professional Consult (permissible substitution): Payer: BC Managed Care – PPO | Admitting: Cardiology

## 2013-06-17 ENCOUNTER — Encounter: Payer: Self-pay | Admitting: Cardiology

## 2013-06-17 ENCOUNTER — Ambulatory Visit (INDEPENDENT_AMBULATORY_CARE_PROVIDER_SITE_OTHER): Payer: BC Managed Care – PPO | Admitting: Cardiology

## 2013-06-17 VITALS — BP 130/80 | HR 78 | Ht 69.0 in | Wt 235.0 lb

## 2013-06-17 DIAGNOSIS — R9431 Abnormal electrocardiogram [ECG] [EKG]: Secondary | ICD-10-CM

## 2013-06-17 NOTE — Progress Notes (Signed)
     HPI: 62 year-old male for evaluation of abnormal electrocardiogram. Echocardiogram in December 2008 showed normal LV function.Stress echocardiogram in December of 2008 was normal. Patient had an electrocardiogram in July 2014 that showed inferior lateral T-wave changes. Cardiology asked to evaluate. Patient has dyspnea with more extreme activities but not routine activities. No orthopnea, PND, pedal edema, palpitations, syncope or exertional chest pain.  Current Outpatient Prescriptions  Medication Sig Dispense Refill  . b complex vitamins tablet Take 1 tablet by mouth daily.      . hydrochlorothiazide (MICROZIDE) 12.5 MG capsule TAKE 1 CAPSULE EVERY DAY  90 capsule  3  . losartan (COZAAR) 100 MG tablet TAKE 1 TABLET EVERY DAY  90 tablet  3  . Omega-3 Fatty Acids (OMEGA 3 PO) Take by mouth daily.       No current facility-administered medications for this visit.    No Known Allergies  Past Medical History  Diagnosis Date  . Hyperlipidemia     LDL goal = < 110  . Hypertension   . Prostate cancer 2008    Past Surgical History  Procedure Laterality Date  . Colonoscopy  2006    negative; [place], [place]  . Prostatectomy  11/2007    Dr.Moul, [facility]  . Wrist surgery  1998    Hammate bone resection 2006  . Cervical fusion  2007    C5-C7 , Dr Wynetta Emery     History   Social History  . Marital Status: Married    Spouse Name: N/A    Number of Children: 3  . Years of Education: N/A   Occupational History  . Teacher/Coach    Social History Main Topics  . Smoking status: Never Smoker   . Smokeless tobacco: Not on file  . Alcohol Use: No  . Drug Use: No  . Sexual Activity: Not on file   Other Topics Concern  . Not on file   Social History Narrative  . No narrative on file    Family History  Problem Relation Age of Onset  . Breast cancer Mother   . Colon cancer Paternal Grandmother   . Diabetes Paternal Grandfather   . Heart attack Paternal Grandfather 55  . Breast  cancer Maternal Grandmother   . Heart attack Maternal Grandfather 72  . Stroke Neg Hx   . Alzheimer's disease Mother   . Heart disease Father     Atrial fibrillation    ROS: no fevers or chills, productive cough, hemoptysis, dysphasia, odynophagia, melena, hematochezia, dysuria, hematuria, rash, seizure activity, orthopnea, PND, pedal edema, claudication. Remaining systems are negative.  Physical Exam:   Blood pressure 130/80, pulse 78, height 5\' 9"  (1.753 m), weight 235 lb (106.595 kg), SpO2 98.00%.  General:  Well developed/well nourished in NAD Skin warm/dry Patient not depressed No peripheral clubbing Back-normal HEENT-normal/normal eyelids Neck supple/normal carotid upstroke bilaterally; no bruits; no JVD; no thyromegaly chest - CTA/ normal expansion CV - RRR/normal S1 and S2; no murmurs, rubs or gallops;  PMI nondisplaced Abdomen -NT/ND, no HSM, no mass, + bowel sounds, no bruit 2+ femoral pulses, no bruits Ext-no edema, chords, 2+ DP Neuro-grossly nonfocal

## 2013-06-17 NOTE — Assessment & Plan Note (Signed)
Blood pressure controlled. Continue present medications. 

## 2013-06-17 NOTE — Assessment & Plan Note (Signed)
Patient has mild dyspnea with more extreme activities. Electrocardiogram shows new inferior lateral T-wave inversion. Plan stress echocardiogram for risk stratification.

## 2013-06-17 NOTE — Patient Instructions (Addendum)
Your physician recommends that you schedule a follow-up appointment in: AS NEEDED PENDING TEST RESULTS  Your physician has requested that you have a stress echocardiogram. For further information please visit [email or web address]. Please follow instruction sheet as given.    

## 2013-07-15 ENCOUNTER — Ambulatory Visit (HOSPITAL_COMMUNITY): Payer: BC Managed Care – PPO | Attending: Cardiology

## 2013-07-15 ENCOUNTER — Ambulatory Visit (HOSPITAL_BASED_OUTPATIENT_CLINIC_OR_DEPARTMENT_OTHER): Payer: BC Managed Care – PPO

## 2013-07-15 DIAGNOSIS — R9431 Abnormal electrocardiogram [ECG] [EKG]: Secondary | ICD-10-CM | POA: Insufficient documentation

## 2013-07-15 DIAGNOSIS — R0989 Other specified symptoms and signs involving the circulatory and respiratory systems: Secondary | ICD-10-CM | POA: Insufficient documentation

## 2013-07-15 DIAGNOSIS — Z8546 Personal history of malignant neoplasm of prostate: Secondary | ICD-10-CM | POA: Insufficient documentation

## 2013-07-15 DIAGNOSIS — I10 Essential (primary) hypertension: Secondary | ICD-10-CM | POA: Insufficient documentation

## 2013-07-15 DIAGNOSIS — E785 Hyperlipidemia, unspecified: Secondary | ICD-10-CM | POA: Insufficient documentation

## 2013-07-15 DIAGNOSIS — R0609 Other forms of dyspnea: Secondary | ICD-10-CM | POA: Insufficient documentation

## 2013-07-15 NOTE — Progress Notes (Signed)
Stress echocardiogram performed. 

## 2013-07-21 ENCOUNTER — Other Ambulatory Visit: Payer: Self-pay

## 2013-09-17 ENCOUNTER — Other Ambulatory Visit: Payer: Self-pay | Admitting: Internal Medicine

## 2013-09-19 NOTE — Telephone Encounter (Signed)
HCTZ refilled per protocol. JG//CMA 

## 2014-01-20 ENCOUNTER — Telehealth: Payer: Self-pay | Admitting: Internal Medicine

## 2014-01-20 NOTE — Telephone Encounter (Signed)
Flow sheet of blood pressure readings have been faxed to [phone]

## 2014-01-20 NOTE — Telephone Encounter (Signed)
Patient came in person to request a letter with Blood Pressure reading to be sent to [facility] [employer]. Fax#: [phone].  Pt was last seen on 04/12/2013. Does he need OV for this?

## 2014-01-20 NOTE — Telephone Encounter (Signed)
See Flow Sheet 

## 2014-03-14 ENCOUNTER — Encounter: Payer: Self-pay | Admitting: *Deleted

## 2014-03-14 ENCOUNTER — Ambulatory Visit (INDEPENDENT_AMBULATORY_CARE_PROVIDER_SITE_OTHER): Payer: BC Managed Care – PPO | Admitting: *Deleted

## 2014-03-14 VITALS — BP 138/86

## 2014-03-14 DIAGNOSIS — I10 Essential (primary) hypertension: Secondary | ICD-10-CM

## 2014-03-15 ENCOUNTER — Other Ambulatory Visit: Payer: Self-pay

## 2014-03-15 MED ORDER — HYDROCHLOROTHIAZIDE 12.5 MG PO CAPS: ORAL_CAPSULE | ORAL | Status: DC

## 2014-04-14 ENCOUNTER — Ambulatory Visit (INDEPENDENT_AMBULATORY_CARE_PROVIDER_SITE_OTHER): Payer: BC Managed Care – PPO | Admitting: Internal Medicine

## 2014-04-14 ENCOUNTER — Encounter: Payer: Self-pay | Admitting: Internal Medicine

## 2014-04-14 VITALS — BP 118/82 | HR 57 | Temp 98.2°F | Ht 69.0 in | Wt 235.0 lb

## 2014-04-14 DIAGNOSIS — I10 Essential (primary) hypertension: Secondary | ICD-10-CM

## 2014-04-14 DIAGNOSIS — E785 Hyperlipidemia, unspecified: Secondary | ICD-10-CM

## 2014-04-14 DIAGNOSIS — Z8546 Personal history of malignant neoplasm of prostate: Secondary | ICD-10-CM

## 2014-04-14 DIAGNOSIS — R9431 Abnormal electrocardiogram [ECG] [EKG]: Secondary | ICD-10-CM

## 2014-04-14 DIAGNOSIS — Z0000 Encounter for general adult medical examination without abnormal findings: Secondary | ICD-10-CM

## 2014-04-14 MED ORDER — HYDROCHLOROTHIAZIDE 12.5 MG PO CAPS: ORAL_CAPSULE | ORAL | Status: DC

## 2014-04-14 MED ORDER — LOSARTAN POTASSIUM 100 MG PO TABS: ORAL_TABLET | ORAL | Status: DC

## 2014-04-14 NOTE — Progress Notes (Signed)
   Subjective:    Patient ID: Johnny [name], male    DOB: May 17, 1951, 63 y.o.   MRN: [id number]  HPI  He is here for a physical;acute issues denied.  Blood pressure range / average : 125/78-140/85 Compliant with anti hypertemsive medication. No lightheadedness or other adverse medication effect described.  A heart healthy /low salt diet is followed. Physically active w/o regular exercise program..  Family history is negative for CVA  Labs done @ [facility] in 01/2014; these will be reviewed before additional labs drawn.     Review of Systems  Significant headaches, epistaxis, chest pain, palpitations, exertional dyspnea, claudication, paroxysmal nocturnal dyspnea, or edema absent.          Objective:   Physical Exam  Gen.: Healthy and well-nourished in appearance. Alert, appropriate and cooperative throughout exam. Appears younger than stated age  Head: Normocephalic without obvious abnormalities. No alopecia  Eyes: No corneal or conjunctival inflammation noted. Pupils equal round reactive to light and accommodation. Extraocular motion intact.  Ears: External  ear exam reveals no significant lesions or deformities. Canals clear .TMs normal. Hearing is grossly normal bilaterally. Nose: External nasal exam reveals no deformity or inflammation. Nasal mucosa are pink and moist. No lesions or exudates noted. Septum to R Mouth: Oral mucosa and oropharynx reveal no lesions or exudates. Teeth in good repair. Neck: No deformities, masses, or tenderness noted. Range of motion & Thyroid normal Lungs: Normal respiratory effort; chest expands symmetrically. Lungs are clear to auscultation without rales, wheezes, or increased work of breathing. Heart: Normal rate and rhythm. Normal S1 and S2. No gallop, click, or rub. No murmur. Abdomen: Protuberant.Bowel sounds normal; abdomen soft and nontender. No masses, organomegaly or hernias noted. Genitalia: as per Dr Darcus Austin                                    Musculoskeletal/extremities: No deformity or scoliosis noted of  the thoracic or lumbar spine.  No clubbing, cyanosis, edema, or significant extremity  deformity noted. Range of motion normal .Tone & strength normal. Hand joints normal Fingernail health good. Able to lie down & sit up w/o help. Negative SLR bilaterally Vascular: Carotid, radial artery, dorsalis pedis and  posterior tibial pulses are full and equal. No bruits present. Neurologic: Alert and oriented x3. Deep tendon reflexes symmetrical and normal.  Gait normal .     Skin: Intact without suspicious lesions or rashes. Lymph: No cervical, axillary lymphadenopathy present. Psych: Mood and affect are normal. Normally interactive                                                                                        Assessment & Plan:  #1 comprehensive physical exam; no acute findings  Plan: see Orders  & Recommendations

## 2014-04-14 NOTE — Patient Instructions (Signed)
Your next office appointment will be determined based upon review of your [facility] 5/15 labs  Cardiovascular exercise, this can be as simple a program as walking, is recommended 30-45 minutes 3-4 times per week. If you're not exercising you should take 6-8 weeks to build up to this level.  Minimal Blood Pressure Goal= AVERAGE < 140/90;  Ideal is an AVERAGE < 135/85. This AVERAGE should be calculated from @ least 5-7 BP readings taken @ different times of day on different days of week. You should not respond to isolated BP readings , but rather the AVERAGE for that week .Please bring your  blood pressure cuff to office visits to verify that it is reliable.It  can also be checked against the blood pressure device at the pharmacy. Finger or wrist cuffs are not dependable; an arm cuff is.  Take the EKG to any emergency room or preop visits. There are nonspecific changes; as long as there is no new change these are not clinically significant . If the old EKG is not available for comparison; it may result in unnecessary hospitalization for observation with significant unnecessary expense.

## 2014-04-14 NOTE — Progress Notes (Signed)
Pre visit review using our clinic review tool, if applicable. No additional management support is needed unless otherwise documented below in the visit note. 

## 2014-04-17 ENCOUNTER — Telehealth: Payer: Self-pay | Admitting: Internal Medicine

## 2014-04-17 NOTE — Telephone Encounter (Signed)
Relevant patient education assigned to patient using Emmi. ° °

## 2014-04-18 ENCOUNTER — Other Ambulatory Visit: Payer: Self-pay | Admitting: Internal Medicine

## 2014-04-18 DIAGNOSIS — Z0000 Encounter for general adult medical examination without abnormal findings: Secondary | ICD-10-CM

## 2014-04-18 DIAGNOSIS — R7309 Other abnormal glucose: Secondary | ICD-10-CM | POA: Insufficient documentation

## 2014-06-02 ENCOUNTER — Other Ambulatory Visit: Payer: Self-pay

## 2014-06-02 DIAGNOSIS — I10 Essential (primary) hypertension: Secondary | ICD-10-CM

## 2014-06-02 MED ORDER — LOSARTAN POTASSIUM 100 MG PO TABS: ORAL_TABLET | ORAL | Status: DC

## 2014-12-06 ENCOUNTER — Other Ambulatory Visit: Payer: Self-pay

## 2014-12-06 DIAGNOSIS — I10 Essential (primary) hypertension: Secondary | ICD-10-CM

## 2014-12-06 MED ORDER — LOSARTAN POTASSIUM 100 MG PO TABS: ORAL_TABLET | ORAL | Status: DC

## 2015-03-05 ENCOUNTER — Other Ambulatory Visit: Payer: Self-pay | Admitting: Emergency Medicine

## 2015-03-05 DIAGNOSIS — I10 Essential (primary) hypertension: Secondary | ICD-10-CM

## 2015-03-05 MED ORDER — HYDROCHLOROTHIAZIDE 12.5 MG PO CAPS: ORAL_CAPSULE | ORAL | Status: DC

## 2015-04-03 ENCOUNTER — Other Ambulatory Visit: Payer: Self-pay | Admitting: Emergency Medicine

## 2015-04-03 DIAGNOSIS — I10 Essential (primary) hypertension: Secondary | ICD-10-CM

## 2015-04-03 MED ORDER — HYDROCHLOROTHIAZIDE 12.5 MG PO CAPS: ORAL_CAPSULE | ORAL | Status: DC

## 2015-04-16 ENCOUNTER — Encounter: Payer: Self-pay | Admitting: Internal Medicine

## 2015-04-16 ENCOUNTER — Other Ambulatory Visit (INDEPENDENT_AMBULATORY_CARE_PROVIDER_SITE_OTHER): Payer: BC Managed Care – PPO

## 2015-04-16 ENCOUNTER — Ambulatory Visit (INDEPENDENT_AMBULATORY_CARE_PROVIDER_SITE_OTHER): Payer: BC Managed Care – PPO | Admitting: Internal Medicine

## 2015-04-16 VITALS — BP 132/90 | HR 61 | Temp 98.7°F | Resp 16 | Ht 69.0 in | Wt 230.0 lb

## 2015-04-16 DIAGNOSIS — Z0000 Encounter for general adult medical examination without abnormal findings: Secondary | ICD-10-CM | POA: Diagnosis not present

## 2015-04-16 DIAGNOSIS — L719 Rosacea, unspecified: Secondary | ICD-10-CM | POA: Insufficient documentation

## 2015-04-16 DIAGNOSIS — Z0189 Encounter for other specified special examinations: Secondary | ICD-10-CM | POA: Diagnosis not present

## 2015-04-16 LAB — HEPATIC FUNCTION PANEL
ALBUMIN: 4.2 g/dL (ref 3.5–5.2)
ALT: 70 U/L — ABNORMAL HIGH (ref 0–53)
AST: 40 U/L — AB (ref 0–37)
Alkaline Phosphatase: 74 U/L (ref 39–117)
Bilirubin, Direct: 0.1 mg/dL (ref 0.0–0.3)
Total Bilirubin: 0.7 mg/dL (ref 0.2–1.2)
Total Protein: 7.3 g/dL (ref 6.0–8.3)

## 2015-04-16 LAB — LIPID PANEL
CHOL/HDL RATIO: 5
Cholesterol: 182 mg/dL (ref 0–200)
HDL: 35.7 mg/dL — AB (ref >39.00)
LDL Cholesterol: 111 mg/dL — ABNORMAL HIGH (ref 0–99)
NonHDL: 146.46
TRIGLYCERIDES: 178 mg/dL — AB (ref 0.0–149.0)
VLDL: 35.6 mg/dL (ref 0.0–40.0)

## 2015-04-16 LAB — CBC WITH DIFFERENTIAL/PLATELET
Basophils Absolute: 0 10*3/uL (ref 0.0–0.1)
Basophils Relative: 0.2 % (ref 0.0–3.0)
Eosinophils Absolute: 0.4 10*3/uL (ref 0.0–0.7)
Eosinophils Relative: 3.6 % (ref 0.0–5.0)
HCT: 48.8 % (ref 39.0–52.0)
HEMOGLOBIN: 16.3 g/dL (ref 13.0–17.0)
LYMPHS ABS: 2 10*3/uL (ref 0.7–4.0)
Lymphocytes Relative: 20 % (ref 12.0–46.0)
MCHC: 33.5 g/dL (ref 30.0–36.0)
MCV: 85.2 fl (ref 78.0–100.0)
Monocytes Absolute: 0.9 10*3/uL (ref 0.1–1.0)
Monocytes Relative: 9.5 % (ref 3.0–12.0)
NEUTROS ABS: 6.6 10*3/uL (ref 1.4–7.7)
NEUTROS PCT: 66.7 % (ref 43.0–77.0)
Platelets: 283 10*3/uL (ref 150.0–400.0)
RBC: 5.72 Mil/uL (ref 4.22–5.81)
RDW: 14.2 % (ref 11.5–15.5)
WBC: 9.9 10*3/uL (ref 4.0–10.5)

## 2015-04-16 LAB — BASIC METABOLIC PANEL
BUN: 11 mg/dL (ref 6–23)
CALCIUM: 9.8 mg/dL (ref 8.4–10.5)
CO2: 28 mEq/L (ref 19–32)
Chloride: 101 mEq/L (ref 96–112)
Creatinine, Ser: 0.94 mg/dL (ref 0.40–1.50)
GFR: 85.86 mL/min (ref >60.00)
GLUCOSE: 99 mg/dL (ref 70–99)
Potassium: 4.3 mEq/L (ref 3.5–5.1)
SODIUM: 137 meq/L (ref 135–145)

## 2015-04-16 LAB — TSH: TSH: 0.95 u[IU]/mL (ref 0.35–4.50)

## 2015-04-16 NOTE — Patient Instructions (Signed)
Minimal Blood Pressure Goal= AVERAGE < 140/90;  Ideal is an AVERAGE < 135/85. This AVERAGE should be calculated from @ least 5-7 BP readings taken @ different times of day on different days of week. You should not respond to isolated BP readings , but rather the AVERAGE for that week .Please bring your  blood pressure cuff to office visits to verify that it is reliable.It  can also be checked against the blood pressure device at the pharmacy. Finger or wrist cuffs are not dependable; an arm cuff is.   Immodium AD as needed for frank diarrhea.Please take a probiotic , Florastor OR Align, every day if the bowels are loose. This will replace the normal bacteria which  are necessary for formation of normal stool and processing of food.  Colonoscopy due this year.

## 2015-04-16 NOTE — Progress Notes (Signed)
Pre visit review using our clinic review tool, if applicable. No additional management support is needed unless otherwise documented below in the visit note. 

## 2015-04-16 NOTE — Progress Notes (Signed)
   Subjective:    Patient ID: Johnny [name], male    DOB: April 09, 1951, 64 y.o.   MRN: [id number]  HPI He is here for a physical;acute issues include recent bowel changes.  He describes watery stools 3-5 times on 7/29. This began early in the morning. On 7/30 he had similar bowel changes 3. On 7/31 there only two watery stools. Today he has had 1 loose stool. He denies exposure to ill individuals, travel, or suspicious foods or drinks.  He has been compliant with his medicines without adverse effects. He does restrict fried foods, red meat, and sodium. He walks on average twice a week without cardiopulmonary symptoms.  Blood pressure averages 130-140/70-82 at home. Colonoscopy is due this year. This was done in [place]. He has no active GI symptoms other than the diarrhea above.  He has seen his urologist in follow-up of the prostate cancer. PSA was 0.03 in June at [facility]. Testosterone level was 300. He has nocturia 2; he denies other genitourinary symptoms.   Review of Systems  Chest pain, palpitations, tachycardia, exertional dyspnea, paroxysmal nocturnal dyspnea, claudication or edema are absent. No unexplained weight loss, abdominal pain, significant dyspepsia, dysphagia, melena, rectal bleeding, or persistently small caliber stools. Dysuria, pyuria, hematuria, frequency, or polyuria are denied. Change in hair, skin, nails denied.  No intolerance to heat or cold.      Objective:   Physical Exam  Pertinent or positive findings include: He has nodular rosacea type changes over the cheeks, greater on the right than the left. Abdomen is protuberant . Deep tendon reflexes are 0+ at the knees. Genitourinary exam was deferred to [facility] urology.  General appearance :adequately nourished; in no distress.  Eyes: No conjunctival inflammation or scleral icterus is present.  Oral exam:  Lips and gums are healthy appearing.There is no oropharyngeal erythema or exudate noted. Dental hygiene is  good.  Heart:  Normal rate and regular rhythm. S1 and S2 normal without gallop, murmur, click, rub or other extra sounds    Lungs:Chest clear to auscultation; no wheezes, rhonchi,rales ,or rubs present.No increased work of breathing.   Abdomen: bowel sounds normal, soft and non-tender without masses, organomegaly or hernias noted.  No guarding or rebound.   Vascular : all pulses equal ; no bruits present.  Skin:Warm & dry.  Intact without suspicious lesions or rashes ; no tenting or jaundice   Lymphatic: No lymphadenopathy is noted about the head, neck, axilla  Neuro: Strength, tone  normal.         Assessment & Plan:  #1 comprehensive physical exam; no acute findings  Plan: see Orders  & Recommendations

## 2015-04-17 ENCOUNTER — Telehealth: Payer: Self-pay | Admitting: Emergency Medicine

## 2015-04-17 NOTE — Telephone Encounter (Signed)
Copy of [place] [employer] Medical paper work mailed to pt, and original copy mailed to [facility]

## 2015-05-04 ENCOUNTER — Other Ambulatory Visit: Payer: Self-pay | Admitting: Emergency Medicine

## 2015-05-04 DIAGNOSIS — I10 Essential (primary) hypertension: Secondary | ICD-10-CM

## 2015-05-04 MED ORDER — HYDROCHLOROTHIAZIDE 12.5 MG PO CAPS: ORAL_CAPSULE | ORAL | Status: DC

## 2015-06-05 ENCOUNTER — Other Ambulatory Visit: Payer: Self-pay | Admitting: Emergency Medicine

## 2015-06-05 DIAGNOSIS — I10 Essential (primary) hypertension: Secondary | ICD-10-CM

## 2015-06-05 MED ORDER — LOSARTAN POTASSIUM 100 MG PO TABS: ORAL_TABLET | ORAL | Status: DC

## 2015-09-05 ENCOUNTER — Telehealth: Payer: Self-pay

## 2015-09-05 NOTE — Telephone Encounter (Signed)
Left message advising patient to call back to schedule nurse visit for flu vaccine or can give flu vaccine info if already recd

## 2015-10-28 ENCOUNTER — Emergency Department (HOSPITAL_COMMUNITY): Payer: BC Managed Care – PPO

## 2015-10-28 ENCOUNTER — Inpatient Hospital Stay (HOSPITAL_COMMUNITY)
Admission: EM | Admit: 2015-10-28 | Discharge: 2015-10-31 | DRG: 380 | Disposition: A | Discharge status: Home / Self Care | Payer: BC Managed Care – PPO | Attending: Internal Medicine | Admitting: Internal Medicine

## 2015-10-28 ENCOUNTER — Encounter (HOSPITAL_COMMUNITY)
Admission: EM | Disposition: A | Discharge status: Home / Self Care | Payer: Self-pay | Source: Home / Self Care | Attending: Internal Medicine

## 2015-10-28 ENCOUNTER — Encounter (HOSPITAL_COMMUNITY): Payer: Self-pay | Admitting: Emergency Medicine

## 2015-10-28 DIAGNOSIS — Z79899 Other long term (current) drug therapy: Secondary | ICD-10-CM

## 2015-10-28 DIAGNOSIS — R579 Shock, unspecified: Secondary | ICD-10-CM | POA: Diagnosis not present

## 2015-10-28 DIAGNOSIS — Z981 Arthrodesis status: Secondary | ICD-10-CM | POA: Diagnosis not present

## 2015-10-28 DIAGNOSIS — K922 Gastrointestinal hemorrhage, unspecified: Secondary | ICD-10-CM

## 2015-10-28 DIAGNOSIS — R571 Hypovolemic shock: Secondary | ICD-10-CM | POA: Diagnosis present

## 2015-10-28 DIAGNOSIS — Z8249 Family history of ischemic heart disease and other diseases of the circulatory system: Secondary | ICD-10-CM | POA: Diagnosis not present

## 2015-10-28 DIAGNOSIS — K2210 Ulcer of esophagus without bleeding: Secondary | ICD-10-CM | POA: Diagnosis not present

## 2015-10-28 DIAGNOSIS — Z8546 Personal history of malignant neoplasm of prostate: Secondary | ICD-10-CM | POA: Diagnosis not present

## 2015-10-28 DIAGNOSIS — D72829 Elevated white blood cell count, unspecified: Secondary | ICD-10-CM | POA: Diagnosis present

## 2015-10-28 DIAGNOSIS — E669 Obesity, unspecified: Secondary | ICD-10-CM | POA: Diagnosis present

## 2015-10-28 DIAGNOSIS — Z803 Family history of malignant neoplasm of breast: Secondary | ICD-10-CM

## 2015-10-28 DIAGNOSIS — D62 Acute posthemorrhagic anemia: Secondary | ICD-10-CM | POA: Diagnosis present

## 2015-10-28 DIAGNOSIS — K22719 Barrett's esophagus with dysplasia, unspecified: Secondary | ICD-10-CM | POA: Diagnosis not present

## 2015-10-28 DIAGNOSIS — R7303 Prediabetes: Secondary | ICD-10-CM | POA: Diagnosis present

## 2015-10-28 DIAGNOSIS — Z6833 Body mass index (BMI) 33.0-33.9, adult: Secondary | ICD-10-CM | POA: Diagnosis not present

## 2015-10-28 DIAGNOSIS — K920 Hematemesis: Secondary | ICD-10-CM | POA: Diagnosis present

## 2015-10-28 DIAGNOSIS — K449 Diaphragmatic hernia without obstruction or gangrene: Secondary | ICD-10-CM | POA: Diagnosis present

## 2015-10-28 DIAGNOSIS — F439 Reaction to severe stress, unspecified: Secondary | ICD-10-CM | POA: Diagnosis present

## 2015-10-28 DIAGNOSIS — K2211 Ulcer of esophagus with bleeding: Secondary | ICD-10-CM | POA: Diagnosis present

## 2015-10-28 DIAGNOSIS — Z833 Family history of diabetes mellitus: Secondary | ICD-10-CM | POA: Diagnosis not present

## 2015-10-28 DIAGNOSIS — K210 Gastro-esophageal reflux disease with esophagitis: Secondary | ICD-10-CM | POA: Diagnosis present

## 2015-10-28 DIAGNOSIS — K921 Melena: Secondary | ICD-10-CM | POA: Diagnosis present

## 2015-10-28 DIAGNOSIS — E785 Hyperlipidemia, unspecified: Secondary | ICD-10-CM | POA: Diagnosis present

## 2015-10-28 DIAGNOSIS — Z8719 Personal history of other diseases of the digestive system: Secondary | ICD-10-CM | POA: Diagnosis not present

## 2015-10-28 DIAGNOSIS — Z800 Family history of malignant neoplasm of digestive organs: Secondary | ICD-10-CM | POA: Diagnosis not present

## 2015-10-28 DIAGNOSIS — N179 Acute kidney failure, unspecified: Secondary | ICD-10-CM | POA: Diagnosis not present

## 2015-10-28 DIAGNOSIS — I10 Essential (primary) hypertension: Secondary | ICD-10-CM | POA: Diagnosis present

## 2015-10-28 DIAGNOSIS — Z9079 Acquired absence of other genital organ(s): Secondary | ICD-10-CM | POA: Diagnosis not present

## 2015-10-28 DIAGNOSIS — Z820 Family history of epilepsy and other diseases of the nervous system: Secondary | ICD-10-CM | POA: Diagnosis not present

## 2015-10-28 DIAGNOSIS — R739 Hyperglycemia, unspecified: Secondary | ICD-10-CM | POA: Diagnosis present

## 2015-10-28 HISTORY — PX: ESOPHAGOGASTRODUODENOSCOPY: SHX5428

## 2015-10-28 LAB — BLOOD GAS, ARTERIAL
Acid-base deficit: 4.8 mmol/L — ABNORMAL HIGH (ref 0.0–2.0)
Acid-base deficit: 4.8 mmol/L — ABNORMAL HIGH (ref 0.0–2.0)
BICARBONATE: 20.1 meq/L (ref 20.0–24.0)
BICARBONATE: 20 meq/L (ref 20.0–24.0)
Drawn by: 11249
Drawn by: 11249
FIO2: 1
FIO2: 0.4
O2 Saturation: 99.5 %
O2 Saturation: 98.2 %
PATIENT TEMPERATURE: 98.7
PCO2 ART: 39 mmHg (ref 35.0–45.0)
PCO2 ART: 38.6 mmHg (ref 35.0–45.0)
PEEP: 5 cmH2O
PEEP: 5 cmH2O
PH ART: 7.333 — AB (ref 7.350–7.450)
Patient temperature: 98.7
RATE: 14 resp/min
RATE: 14 resp/min
TCO2: 18.4 mmol/L (ref 0–100)
TCO2: 18.5 mmol/L (ref 0–100)
VT: 680 mL
VT: 590 mL
pH, Arterial: 7.336 — ABNORMAL LOW (ref 7.350–7.450)
pO2, Arterial: 290 mmHg — ABNORMAL HIGH (ref 80.0–100.0)
pO2, Arterial: 118 mmHg — ABNORMAL HIGH (ref 80.0–100.0)

## 2015-10-28 LAB — CBC WITH DIFFERENTIAL/PLATELET
BASOS ABS: 0 10*3/uL (ref 0.0–0.1)
BASOS ABS: 0 10*3/uL (ref 0.0–0.1)
BASOS PCT: 0 %
BASOS PCT: 0 %
Eosinophils Absolute: 0.1 10*3/uL (ref 0.0–0.7)
Eosinophils Absolute: 0 10*3/uL (ref 0.0–0.7)
Eosinophils Relative: 0 %
Eosinophils Relative: 0 %
HEMATOCRIT: 37.8 % — AB (ref 39.0–52.0)
HEMATOCRIT: 38.1 % — AB (ref 39.0–52.0)
HEMOGLOBIN: 12.3 g/dL — AB (ref 13.0–17.0)
HEMOGLOBIN: 12.4 g/dL — AB (ref 13.0–17.0)
Lymphocytes Relative: 14 %
Lymphocytes Relative: 9 %
Lymphs Abs: 2.6 10*3/uL (ref 0.7–4.0)
Lymphs Abs: 1.2 10*3/uL (ref 0.7–4.0)
MCH: 28.3 pg (ref 26.0–34.0)
MCH: 28.8 pg (ref 26.0–34.0)
MCHC: 32.5 g/dL (ref 30.0–36.0)
MCHC: 32.5 g/dL (ref 30.0–36.0)
MCV: 86.9 fL (ref 78.0–100.0)
MCV: 88.6 fL (ref 78.0–100.0)
MONOS PCT: 9 %
Monocytes Absolute: 1.2 10*3/uL — ABNORMAL HIGH (ref 0.1–1.0)
Monocytes Absolute: 1.3 10*3/uL — ABNORMAL HIGH (ref 0.1–1.0)
Monocytes Relative: 7 %
NEUTROS ABS: 14.1 10*3/uL — AB (ref 1.7–7.7)
NEUTROS ABS: 11.4 10*3/uL — AB (ref 1.7–7.7)
NEUTROS PCT: 79 %
NEUTROS PCT: 82 %
Platelets: 389 10*3/uL (ref 150–400)
Platelets: 273 10*3/uL (ref 150–400)
RBC: 4.35 MIL/uL (ref 4.22–5.81)
RBC: 4.3 MIL/uL (ref 4.22–5.81)
RDW: 14 % (ref 11.5–15.5)
RDW: 14.1 % (ref 11.5–15.5)
WBC: 18 10*3/uL — AB (ref 4.0–10.5)
WBC: 13.8 10*3/uL — AB (ref 4.0–10.5)

## 2015-10-28 LAB — I-STAT TROPONIN, ED: Troponin i, poc: 0.02 ng/mL (ref 0.00–0.08)

## 2015-10-28 LAB — I-STAT CHEM 8, ED
BUN: 68 mg/dL — AB (ref 6–20)
CALCIUM ION: 1.08 mmol/L — AB (ref 1.13–1.30)
Chloride: 103 mmol/L (ref 101–111)
Creatinine, Ser: 1.2 mg/dL (ref 0.61–1.24)
GLUCOSE: 261 mg/dL — AB (ref 65–99)
HCT: 38 % — ABNORMAL LOW (ref 39.0–52.0)
Hemoglobin: 12.9 g/dL — ABNORMAL LOW (ref 13.0–17.0)
Potassium: 5.4 mmol/L — ABNORMAL HIGH (ref 3.5–5.1)
SODIUM: 138 mmol/L (ref 135–145)
TCO2: 21 mmol/L (ref 0–100)

## 2015-10-28 LAB — COMPREHENSIVE METABOLIC PANEL
ALBUMIN: 3.3 g/dL — AB (ref 3.5–5.0)
ALK PHOS: 49 U/L (ref 38–126)
ALT: 43 U/L (ref 17–63)
AST: 51 U/L — AB (ref 15–41)
Anion gap: 16 — ABNORMAL HIGH (ref 5–15)
BILIRUBIN TOTAL: 1.6 mg/dL — AB (ref 0.3–1.2)
BUN: 46 mg/dL — AB (ref 6–20)
CO2: 19 mmol/L — ABNORMAL LOW (ref 22–32)
Calcium: 8.7 mg/dL — ABNORMAL LOW (ref 8.9–10.3)
Chloride: 104 mmol/L (ref 101–111)
Creatinine, Ser: 1.52 mg/dL — ABNORMAL HIGH (ref 0.61–1.24)
GFR calc Af Amer: 54 mL/min — ABNORMAL LOW (ref >60)
GFR calc non Af Amer: 47 mL/min — ABNORMAL LOW (ref >60)
GLUCOSE: 274 mg/dL — AB (ref 65–99)
POTASSIUM: 5.3 mmol/L — AB (ref 3.5–5.1)
Sodium: 139 mmol/L (ref 135–145)
TOTAL PROTEIN: 6 g/dL — AB (ref 6.5–8.1)

## 2015-10-28 LAB — BASIC METABOLIC PANEL
ANION GAP: 5 (ref 5–15)
BUN: 44 mg/dL — ABNORMAL HIGH (ref 6–20)
CHLORIDE: 115 mmol/L — AB (ref 101–111)
CO2: 20 mmol/L — AB (ref 22–32)
Calcium: 7.4 mg/dL — ABNORMAL LOW (ref 8.9–10.3)
Creatinine, Ser: 1.02 mg/dL (ref 0.61–1.24)
GFR calc non Af Amer: >60 mL/min (ref >60)
Glucose, Bld: 188 mg/dL — ABNORMAL HIGH (ref 65–99)
POTASSIUM: 5.1 mmol/L (ref 3.5–5.1)
Sodium: 140 mmol/L (ref 135–145)

## 2015-10-28 LAB — PROTIME-INR
INR: 1.16 (ref 0.00–1.49)
Prothrombin Time: 15 seconds (ref 11.6–15.2)

## 2015-10-28 LAB — TROPONIN I

## 2015-10-28 LAB — GLUCOSE, CAPILLARY: Glucose-Capillary: 149 mg/dL — ABNORMAL HIGH (ref 65–99)

## 2015-10-28 LAB — APTT: aPTT: 25 seconds (ref 24–37)

## 2015-10-28 LAB — LIPASE, BLOOD: Lipase: 23 U/L (ref 11–51)

## 2015-10-28 LAB — PREPARE RBC (CROSSMATCH)

## 2015-10-28 SURGERY — EGD (ESOPHAGOGASTRODUODENOSCOPY)
Anesthesia: Moderate Sedation | Laterality: Left

## 2015-10-28 MED ORDER — PROPOFOL 1000 MG/100ML IV EMUL
0.0000 ug/kg/min | INTRAVENOUS | Status: DC
  Administered 2015-10-28 – 2015-10-29 (×2): 30 ug/kg/min via INTRAVENOUS
  Administered 2015-10-29: 20 ug/kg/min via INTRAVENOUS
  Filled 2015-10-28 (×2): qty 100

## 2015-10-28 MED ORDER — SODIUM CHLORIDE 0.9 % IV SOLN
8.0000 mg/h | INTRAVENOUS | Status: DC
  Administered 2015-10-28 – 2015-10-31 (×6): 8 mg/h via INTRAVENOUS
  Filled 2015-10-28 (×14): qty 80

## 2015-10-28 MED ORDER — FENTANYL CITRATE (PF) 100 MCG/2ML IJ SOLN
INTRAMUSCULAR | Status: AC
  Filled 2015-10-28: qty 4

## 2015-10-28 MED ORDER — SODIUM CHLORIDE 0.9 % IV SOLN
50.0000 ug/h | INTRAVENOUS | Status: DC
  Administered 2015-10-28: 50 ug/h via INTRAVENOUS
  Filled 2015-10-28 (×2): qty 1

## 2015-10-28 MED ORDER — MIDAZOLAM HCL 10 MG/2ML IJ SOLN
INTRAMUSCULAR | Status: DC | PRN
  Administered 2015-10-28: 2 mg via INTRAVENOUS
  Administered 2015-10-28: 1 mg via INTRAVENOUS
  Administered 2015-10-28: 4 mg via INTRAVENOUS
  Administered 2015-10-28: 2 mg via INTRAVENOUS
  Administered 2015-10-28: 1 mg via INTRAVENOUS
  Administered 2015-10-28: 5 mg via INTRAVENOUS

## 2015-10-28 MED ORDER — DIPHENHYDRAMINE HCL 50 MG/ML IJ SOLN
INTRAMUSCULAR | Status: AC
  Filled 2015-10-28: qty 1

## 2015-10-28 MED ORDER — SODIUM CHLORIDE 0.9 % IV BOLUS (SEPSIS)
2000.0000 mL | Freq: Once | INTRAVENOUS | Status: AC
  Administered 2015-10-28: 2000 mL via INTRAVENOUS

## 2015-10-28 MED ORDER — ONDANSETRON HCL 4 MG/2ML IJ SOLN
4.0000 mg | Freq: Once | INTRAMUSCULAR | Status: AC
  Administered 2015-10-28: 4 mg via INTRAVENOUS
  Filled 2015-10-28: qty 2

## 2015-10-28 MED ORDER — PROPOFOL 1000 MG/100ML IV EMUL
INTRAVENOUS | Status: AC
  Administered 2015-10-28: 1000 mg
  Filled 2015-10-28: qty 100

## 2015-10-28 MED ORDER — FENTANYL CITRATE (PF) 100 MCG/2ML IJ SOLN
100.0000 ug | INTRAMUSCULAR | Status: AC | PRN
  Administered 2015-10-29 (×3): 100 ug via INTRAVENOUS
  Filled 2015-10-28 (×3): qty 2

## 2015-10-28 MED ORDER — SUCCINYLCHOLINE CHLORIDE 20 MG/ML IJ SOLN
100.0000 mg | Freq: Once | INTRAMUSCULAR | Status: AC
  Administered 2015-10-28: 100 mg via INTRAVENOUS

## 2015-10-28 MED ORDER — SODIUM CHLORIDE 0.9 % IV SOLN
80.0000 mg | Freq: Once | INTRAVENOUS | Status: AC
  Administered 2015-10-28: 80 mg via INTRAVENOUS
  Filled 2015-10-28: qty 80

## 2015-10-28 MED ORDER — SODIUM CHLORIDE 0.9 % IV SOLN: 10.0000 mL/h | Freq: Once | INTRAVENOUS | Status: DC

## 2015-10-28 MED ORDER — SODIUM CHLORIDE 0.9 % IV SOLN: INTRAVENOUS | Status: DC

## 2015-10-28 MED ORDER — OCTREOTIDE LOAD VIA INFUSION
50.0000 ug | Freq: Once | INTRAVENOUS | Status: AC
  Administered 2015-10-28: 50 ug via INTRAVENOUS
  Filled 2015-10-28: qty 25

## 2015-10-28 MED ORDER — SODIUM CHLORIDE 0.9 % IV SOLN: 250.0000 mL | INTRAVENOUS | Status: DC | PRN

## 2015-10-28 MED ORDER — MIDAZOLAM HCL 2 MG/2ML IJ SOLN
5.0000 mg | Freq: Once | INTRAMUSCULAR | Status: AC
  Administered 2015-10-28: 5 mg via INTRAVENOUS
  Filled 2015-10-28: qty 6

## 2015-10-28 MED ORDER — MIDAZOLAM HCL 5 MG/ML IJ SOLN
INTRAMUSCULAR | Status: AC
  Filled 2015-10-28: qty 3

## 2015-10-28 MED ORDER — PROPOFOL 1000 MG/100ML IV EMUL
0.0000 ug/kg/min | INTRAVENOUS | Status: DC
  Administered 2015-10-28: 30 ug/kg/min via INTRAVENOUS

## 2015-10-28 MED ORDER — INSULIN ASPART 100 UNIT/ML ~~LOC~~ SOLN
2.0000 [IU] | SUBCUTANEOUS | Status: DC
  Administered 2015-10-28 – 2015-10-29 (×4): 2 [IU] via SUBCUTANEOUS

## 2015-10-28 MED ORDER — SODIUM CHLORIDE 0.9 % IV SOLN
2.0000 mg/h | INTRAVENOUS | Status: DC
  Administered 2015-10-28: 2 mg/h via INTRAVENOUS
  Filled 2015-10-28: qty 10

## 2015-10-28 MED ORDER — FENTANYL CITRATE (PF) 100 MCG/2ML IJ SOLN: 100.0000 ug | INTRAMUSCULAR | Status: DC | PRN

## 2015-10-28 MED ORDER — ETOMIDATE 2 MG/ML IV SOLN
0.3000 mg/kg | Freq: Once | INTRAVENOUS | Status: AC
  Administered 2015-10-28: 19:00:00 via INTRAVENOUS

## 2015-10-28 MED ORDER — FENTANYL CITRATE (PF) 100 MCG/2ML IJ SOLN
INTRAMUSCULAR | Status: DC | PRN
  Administered 2015-10-28: 25 ug via INTRAVENOUS
  Administered 2015-10-28 (×2): 50 ug via INTRAVENOUS
  Administered 2015-10-28: 25 ug via INTRAVENOUS

## 2015-10-28 NOTE — ED Notes (Signed)
Bed: WA05 Expected date:  Expected time:  Means of arrival:  Comments: Triage  

## 2015-10-28 NOTE — Op Note (Signed)
[facility] [place], [address]   ENDOSCOPY PROCEDURE REPORT  PATIENT: Johnny, [name]  MR#: [id number] BIRTHDATE: 04/30/1951 , 48  yrs. old GENDER: male ENDOSCOPIST: Teena Irani, MD REFERRED BY: PROCEDURE DATE:  October 29, 2015 PROCEDURE: ASA CLASS: INDICATIONS:   hematemesis MEDICATIONS: 150 minus grams fentanyl, 15 mg IV Versed and additional per emergency room physician after intubation. TOPICAL ANESTHETIC:  DESCRIPTION OF PROCEDURE: After the risks benefits and alternatives of the procedure were thoroughly explained, informed consent was obtained.  The Pentax Gastroscope [id number] endoscope was introduced through the mouth and advanced to the second portion of the duodenum , Without limitations.  The instrument was slowly withdrawn as the mucosa was fully examined. Estimated blood loss is zero unless otherwise noted in this procedure report.    Barrett's esophagus from 34-37 cm 8 mm distal esophageal ulcer with protruding vessel, injected with 1 mL of 1-10,000 epinephrine and Endo Clip placed. Very patulous GE junction with 3 cm hiatal hernia. Large amount of old blood and coffee grounds in the stomach mostly lavaged.Body and antrum pylorus and duodenum all appeared normal some dependent portions of the fundus were not completely seen. Initial intubation was difficult and resulted in vomiting. He was thus decided tointubate the patient and emergency room physician was called to perform this and the procedure was thus carried out with findings as above.              The scope was then withdrawn from the patient and the procedure completed.  COMPLICATIONS: There were no immediate complications.  ENDOSCOPIC IMPRESSION: esophageal ulcer Hiatal hernia esophagus   RECOMMENDATIONS: IV PPI Monitor stools and hemoglobin Due for colonoscopy and will repeat follow-up EGDfor healing of ulcer and biopsy of Barrett's esophagus.  REPEAT  EXAM:  eSigned:  Teena Irani, MD 10/29/15 7:43 PM    CC:  CPT CODES: ICD CODES:  The ICD and CPT codes recommended by this software are interpretations from the data that the clinical staff has captured with the software.  The verification of the translation of this report to the ICD and CPT codes and modifiers is the sole responsibility of the health care institution and practicing physician where this report was generated.  [facility]. will not be held responsible for the validity of the ICD and CPT codes included on this report.  AMA assumes no liability for data contained or not contained herein. CPT is a Designer, television/film set of the [employer].  PATIENT NAME:  Johnny [name], Johnny [name] MR#: [id number]

## 2015-10-28 NOTE — Progress Notes (Signed)
eLink Physician-Brief Progress Note Patient Name: Johnny [name] DOB: 1951-08-01 MRN: [id number]   Date of Service  10/28/2015  HPI/Events of Note  Patient intubated for airway protection in the setting of GIB.  Was placed on propofol and versed gtts in ED.  Now off propofol and on low dose versed gtt.  Inadequate sedation per bedside nurse.  eICU Interventions  Plan: Continue with propofol D/C versed PRN fentanyl     Intervention Category Major Interventions: Other:  DETERDING,ELIZABETH 10/28/2015, 11:29 PM

## 2015-10-28 NOTE — ED Notes (Addendum)
Patient presents for hematemesis with clots x2 starting today. Emesis in lobby approximately 200-300 cc. Patient c/o generalized weakness, pale during triage.

## 2015-10-28 NOTE — ED Notes (Signed)
Dr. Amedeo Plenty is here and endoscopy is begun at this time.  Pt. Remains in  No distress.  I have showed his wife to our waiting room and inform her I will come to let her know when procedure is finished,m for which she thanks me.

## 2015-10-28 NOTE — Progress Notes (Signed)
RT had to change Height in computer due to wrong measurements. Johnny [name] is 5'10" per family. Patient is now on 74 ,which is his 8 cc. RT will do another ABG in 1 hour.

## 2015-10-28 NOTE — Consult Note (Signed)
[facility] Consult Note  Referring Provider: No ref. provider found Primary Care Physician:  [name], MD Primary Gastroenterologist:  Dr.  Laurel Dimmer Complaint: Hematemesis HPI: Johnny [name] is an 65 y.o. white male  with no significant past medical history other than prostate cancer to presented with weakness near syncope and hematemesis followed by melena. No hematemesis was witnessed in the emergency room. He denies any history of liver disease, history of alcohol use or NSAID use and has no history of peptic ulcer disease. He's had a colonoscopy about 10 years ago but has never had an upper endoscopy.  Past Medical History  Diagnosis Date  . Hyperlipidemia     LDL goal = < 110  . Hypertension   . Prostate cancer [facility]) 2008    Dr Darcus Austin, [facility]    Past Surgical History  Procedure Laterality Date  . Colonoscopy  2006    negative; [place], [place]  . Prostatectomy  11/2007    Dr.Moul, [place]  . Wrist surgery  1998    Hammate bone resection 2006  . Cervical fusion  2007    C5-C7 , Dr Saintclair Halsted      (Not in a hospital admission)  Allergies: No Known Allergies  Family History  Problem Relation Age of Onset  . Breast cancer Mother   . Colon cancer Paternal Grandmother   . Diabetes Paternal Grandfather   . Heart attack Paternal Grandfather 68  . Breast cancer Maternal Grandmother   . Heart attack Maternal Grandfather 72  . Stroke Neg Hx   . Alzheimer's disease Mother   . Heart disease Father     Atrial fibrillation    Social History:  reports that he has never smoked. He does not have any smokeless tobacco history on file. He reports that he does not drink alcohol or use illicit drugs.  Review of Systems: negative except as above   Blood pressure 124/72, pulse 96, temperature 98.5 F (36.9 C), temperature source Oral, resp. rate 11, SpO2 100 %. Head: Normocephalic, without obvious abnormality, atraumatic Neck: no adenopathy, no carotid bruit, no JVD, supple,  symmetrical, trachea midline and thyroid not enlarged, symmetric, no tenderness/mass/nodules Resp: clear to auscultation bilaterally Cardio: regular rate and rhythm, S1, S2 normal, no murmur, click, rub or gallop GI: Abdomen soft distended with normoactive bowel sounds. No hepatomegaly masses or guarding. Extremities: extremities normal, atraumatic, no cyanosis or edema  Results for orders placed or performed during the hospital encounter of 10/28/15 (from the past 48 hour(s))  CBC with Differential/Platelet     Status: Abnormal   Collection Time: 10/28/15  4:20 PM  Result Value Ref Range   WBC 18.0 (H) 4.0 - 10.5 K/uL   RBC 4.35 4.22 - 5.81 MIL/uL   Hemoglobin 12.3 (L) 13.0 - 17.0 g/dL   HCT 37.8 (L) 39.0 - 52.0 %   MCV 86.9 78.0 - 100.0 fL   MCH 28.3 26.0 - 34.0 pg   MCHC 32.5 30.0 - 36.0 g/dL   RDW 14.0 11.5 - 15.5 %   Platelets 389 150 - 400 K/uL   Neutrophils Relative % 79 %   Neutro Abs 14.1 (H) 1.7 - 7.7 K/uL   Lymphocytes Relative 14 %   Lymphs Abs 2.6 0.7 - 4.0 K/uL   Monocytes Relative 7 %   Monocytes Absolute 1.2 (H) 0.1 - 1.0 K/uL   Eosinophils Relative 0 %   Eosinophils Absolute 0.1 0.0 - 0.7 K/uL   Basophils Relative 0 %   Basophils Absolute  0.0 0.0 - 0.1 K/uL  Protime-INR     Status: None   Collection Time: 10/28/15  4:20 PM  Result Value Ref Range   Prothrombin Time 15.0 11.6 - 15.2 seconds   INR 1.16 0.00 - 1.49  Type and screen     Status: None (Preliminary result)   Collection Time: 10/28/15  4:20 PM  Result Value Ref Range   ABO/RH(D) A POS    Antibody Screen PENDING    Sample Expiration 10/31/2015    Unit Number [id number]    Blood Component Type RED CELLS,LR    Unit division 00    Status of Unit ISSUED    Unit tag comment VERBAL ORDERS PER DR YELVERTON    Transfusion Status OK TO TRANSFUSE    Crossmatch Result PENDING    Unit Number [id number]    Blood Component Type RED CELLS,LR    Unit division 00    Status of Unit ISSUED    Unit tag  comment VERBAL ORDERS PER DR Lita Mains    Transfusion Status OK TO TRANSFUSE    Crossmatch Result PENDING   APTT     Status: None   Collection Time: 10/28/15  4:20 PM  Result Value Ref Range   aPTT 25 24 - 37 seconds  Prepare RBC     Status: None   Collection Time: 10/28/15  4:22 PM  Result Value Ref Range   Order Confirmation ORDER PROCESSED BY BLOOD BANK   I-stat troponin, ED     Status: None   Collection Time: 10/28/15  4:41 PM  Result Value Ref Range   Troponin i, poc 0.02 0.00 - 0.08 ng/mL   Comment 3            Comment: Due to the release kinetics of cTnI, a negative result within the first hours of the onset of symptoms does not rule out myocardial infarction with certainty. If myocardial infarction is still suspected, repeat the test at appropriate intervals.   I-stat chem 8, ed     Status: Abnormal   Collection Time: 10/28/15  4:43 PM  Result Value Ref Range   Sodium 138 135 - 145 mmol/L   Potassium 5.4 (H) 3.5 - 5.1 mmol/L   Chloride 103 101 - 111 mmol/L   BUN 68 (H) 6 - 20 mg/dL   Creatinine, Ser 1.20 0.61 - 1.24 mg/dL   Glucose, Bld 261 (H) 65 - 99 mg/dL   Calcium, Ion 1.08 (L) 1.13 - 1.30 mmol/L   TCO2 21 0 - 100 mmol/L   Hemoglobin 12.9 (L) 13.0 - 17.0 g/dL   HCT 38.0 (L) 39.0 - 52.0 %   Dg Chest Port 1 View  10/28/2015  CLINICAL DATA:  Hematemesis today with generalized weakness. History of hypertension and prostate cancer. Nonsmoker. EXAM: PORTABLE CHEST 1 VIEW COMPARISON:  Chest radiographs 04/23/2006. FINDINGS: 1627 hours. The heart size and mediastinal contours are normal. The lungs are clear. There is no pleural effusion or pneumothorax. No acute osseous findings are identified. There are postsurgical changes status post lower cervical fusion. Telemetry leads overlie the chest. IMPRESSION: No active cardiopulmonary process. Electronically Signed   By: [name] M.D.   On: 10/28/2015 17:07    Assessment: Upper GI bleed probable peptic ulcer disease  versus Mallory-Weiss tear versus esophageal varices Plan:  IV proton pump inhibitor. Urgent endoscopy. Transfuse as necessary. Alissa Pharr C 10/28/2015, 5:34 PM  Pager [phone] If no answer or after 5 PM call [phone]

## 2015-10-28 NOTE — ED Provider Notes (Signed)
CSN: [id number]     Arrival date & time 10/28/15  1607 History   First MD Initiated Contact with Patient 10/28/15 1617     Chief Complaint  Patient presents with  . Hematemesis     (Consider location/radiation/quality/duration/timing/severity/associated sxs/prior Treatment) HPI Patient presents with 2 episodes of gross hematemesis. Described as red blood with clots. First episode with this morning. Had associated lightheadedness and near syncope. Had several episodes of loose stool which she described as black. Wife states she noticed the patient was "yellow and pale". Was advised by the nurse to come to the emergency department. Patient vomited red blood with clots in the triage area. RN estimates roughly 200-300 mL. Patient states he's had episodic upper abdominal pain for the last week. He denies recent heavy NSAID use. No history of hepatitis or chronic alcoholism. Currently has no pain but is lightheaded and nauseated. No recent febrile illness.  Past Medical History  Diagnosis Date  . Hyperlipidemia     LDL goal = < 110  . Hypertension   . Prostate cancer [facility]) 2008    Dr Darcus Austin, [facility]   Past Surgical History  Procedure Laterality Date  . Colonoscopy  2006    negative; [place], [place]  . Prostatectomy  11/2007    Dr[place]Moul, [place]  . Wrist surgery  1998    Hammate bone resection 2006  . Cervical fusion  2007    C5-C7 , Dr Saintclair Halsted    Family History  Problem Relation Age of Onset  . Breast cancer Mother   . Colon cancer Paternal Grandmother   . Diabetes Paternal Grandfather   . Heart attack Paternal Grandfather 45  . Breast cancer Maternal Grandmother   . Heart attack Maternal Grandfather 72  . Stroke Neg Hx   . Alzheimer's disease Mother   . Heart disease Father     Atrial fibrillation   Social History  Substance Use Topics  . Smoking status: Never Smoker   . Smokeless tobacco: None  . Alcohol Use: No    Review of Systems  Constitutional: Positive for diaphoresis and  fatigue. Negative for fever and chills.  Respiratory: Negative for shortness of breath.   Cardiovascular: Negative for chest pain.  Gastrointestinal: Positive for nausea, vomiting, abdominal pain and blood in stool.  Genitourinary: Negative for hematuria.  Musculoskeletal: Negative for back pain, neck pain and neck stiffness.  Neurological: Positive for dizziness, weakness (generalized) and light-headedness. Negative for numbness and headaches.  All other systems reviewed and are negative.     Allergies  Review of patient's allergies indicates no known allergies.  Home Medications   Prior to Admission medications   Medication Sig Start Date End Date Taking? Authorizing Provider  Albuterol Sulfate (PROAIR RESPICLICK) [id number] (90 Base) MCG/ACT AEPB Inhale 2 puffs into the lungs every 6 (six) hours as needed (for shortness of breath).   Yes Historical Provider, MD  b complex vitamins tablet Take 1 tablet by mouth daily.   Yes Historical Provider, MD  hydrochlorothiazide (MICROZIDE) 12.5 MG capsule TAKE 1 CAPSULE EVERY DAY 05/04/15  Yes Hendricks Limes, MD  losartan (COZAAR) 100 MG tablet TAKE 1 TABLET EVERY DAY 06/05/15  Yes Hendricks Limes, MD  Omega-3 Fatty Acids (OMEGA 3 PO) Take 1 tablet by mouth daily.    Yes Historical Provider, MD  Polyethyl Glycol-Propyl Glycol (SYSTANE) 0.4-0.3 % SOLN Place 1 drop into both eyes daily.   Yes Historical Provider, MD   BP 117/62 mmHg  Pulse 91  Temp(Src) 97.7  F (36.5 C) (Axillary)  Resp 16  Ht 5\' 10"  (1.778 m)  SpO2 100% Physical Exam  Constitutional: He is oriented to person, place, and time. He appears well-developed and well-nourished. He appears distressed.  Pale and diaphoretic.  HENT:  Head: Normocephalic and atraumatic.  Mouth/Throat: Oropharynx is clear and moist. No oropharyngeal exudate.  No nasal or pharyngeal bleeding visualized  Eyes: EOM are normal. Pupils are equal, round, and reactive to light.  Neck: Normal range of motion.  Neck supple. No JVD present.  Cardiovascular: Regular rhythm.   Tachycardia  Pulmonary/Chest: Effort normal and breath sounds normal. No respiratory distress. He has no wheezes. He has no rales. He exhibits no tenderness.  Gynecomastia  Abdominal: Soft. Bowel sounds are normal. He exhibits distension. He exhibits no mass. There is no tenderness. There is no rebound and no guarding.  Musculoskeletal: Normal range of motion. He exhibits no edema or tenderness.  Neurological: He is alert and oriented to person, place, and time.  Moving all extremities without focal deficit. Sensation is intact.  Skin: Skin is warm. No rash noted. He is diaphoretic. No erythema. There is pallor.  Spider nevi noted to bilateral cheeks  Nursing note and vitals reviewed.   ED Course  .Intubation Date/Time: 10/28/2015 6:47 PM Performed by: Julianne Rice Authorized by: Lita Mains, Cataleya Cristina Consent: The procedure was performed in an emergent situation. Indications: airway protection Intubation method: video-assisted Preoxygenation: nonrebreather mask Sedatives: etomidate Paralytic: succinylcholine Laryngoscope size: Mac 3 Tube size: 7.5 mm Tube type: cuffed Number of attempts: 3 Ventilation between attempts: BVM Cricoid pressure: yes Cords visualized: yes Post-procedure assessment: chest rise and CO2 detector Breath sounds: equal Cuff inflated: yes ETT to lip: 25 cm Tube secured with: ETT holder Chest x-ray interpreted by me. Chest x-ray findings: endotracheal tube too high Tube repositioned: tube repositioned successfully Patient tolerance: Patient tolerated the procedure well with no immediate complications   (including critical care time) Labs Review Labs Reviewed  CBC WITH DIFFERENTIAL/PLATELET - Abnormal; Notable for the following:    WBC 18.0 (*)    Hemoglobin 12.3 (*)    HCT 37.8 (*)    Neutro Abs 14.1 (*)    Monocytes Absolute 1.2 (*)    All other components within normal limits   COMPREHENSIVE METABOLIC PANEL - Abnormal; Notable for the following:    Potassium 5.3 (*)    CO2 19 (*)    Glucose, Bld 274 (*)    BUN 46 (*)    Creatinine, Ser 1.52 (*)    Calcium 8.7 (*)    Total Protein 6.0 (*)    Albumin 3.3 (*)    AST 51 (*)    Total Bilirubin 1.6 (*)    GFR calc non Af Amer 47 (*)    GFR calc Af Amer 54 (*)    Anion gap 16 (*)    All other components within normal limits  BLOOD GAS, ARTERIAL - Abnormal; Notable for the following:    pH, Arterial 7.333 (*)    pO2, Arterial 290 (*)    Acid-base deficit 4.8 (*)    All other components within normal limits  I-STAT CHEM 8, ED - Abnormal; Notable for the following:    Potassium 5.4 (*)    BUN 68 (*)    Glucose, Bld 261 (*)    Calcium, Ion 1.08 (*)    Hemoglobin 12.9 (*)    HCT 38.0 (*)    All other components within normal limits  H.PYLORI ANTIGEN, STOOL  PROTIME-INR  LIPASE, BLOOD  TROPONIN I  APTT  BLOOD GAS, ARTERIAL  CBC WITH DIFFERENTIAL/PLATELET  BASIC METABOLIC PANEL  CBC  BASIC METABOLIC PANEL  MAGNESIUM  PHOSPHORUS  HEMOGLOBIN AND HEMATOCRIT, BLOOD  HEMOGLOBIN AND HEMATOCRIT, BLOOD  HEMOGLOBIN AND HEMATOCRIT, BLOOD  I-STAT CG4 LACTIC ACID, ED  I-STAT TROPOININ, ED  TYPE AND SCREEN  PREPARE RBC (CROSSMATCH)  ABO/RH    Imaging Review Dg Chest 1 View  10/28/2015  CLINICAL DATA:  Status post intubation. History of hypertension and prostate cancer. EXAM: CHEST 1 VIEW COMPARISON:  Earlier the same date.  04/23/2006. FINDINGS: 1849 hours. Interval intubation. The endotracheal tube is in the midtrachea. There are lower lung volumes with increased left-greater-than-right basilar airspace opacities. The heart size and mediastinal contours are stable. There is no pleural effusion or pneumothorax. IMPRESSION: Satisfactorily positioned endotracheal tube. Left-greater-than-right basilar airspace opacities suspicious for aspiration. Electronically Signed   By: [name] M.D.   On: 10/28/2015  19:31   Dg Chest Port 1 View  10/28/2015  CLINICAL DATA:  Hematemesis today with generalized weakness. History of hypertension and prostate cancer. Nonsmoker. EXAM: PORTABLE CHEST 1 VIEW COMPARISON:  Chest radiographs 04/23/2006. FINDINGS: 1627 hours. The heart size and mediastinal contours are normal. The lungs are clear. There is no pleural effusion or pneumothorax. No acute osseous findings are identified. There are postsurgical changes status post lower cervical fusion. Telemetry leads overlie the chest. IMPRESSION: No active cardiopulmonary process. Electronically Signed   By: [name] M.D.   On: 10/28/2015 17:07   I have personally reviewed and evaluated these images and lab results as part of my medical decision-making.   EKG Interpretation   Date/Time:  Sunday October 28 2015 16:26:20 EST Ventricular Rate:  107 PR Interval:  177 QRS Duration: 93 QT Interval:  360 QTC Calculation: 480 R Axis:   -15 Text Interpretation:  Sinus tachycardia Borderline left axis deviation  Borderline prolonged QT interval Confirmed by Lita Mains  MD, Carsyn Boster ([id number])  on 10/28/2015 4:47:08 PM     CRITICAL CARE Performed by: Lita Mains, Keatyn Luck Total critical care time: 60 minutes Critical care time was exclusive of separately billable procedures and treating other patients. Critical care was necessary to treat or prevent imminent or life-threatening deterioration. Critical care was time spent personally by me on the following activities: development of treatment plan with patient and/or surrogate as well as nursing, discussions with consultants, evaluation of patient's response to treatment, examination of patient, obtaining history from patient or surrogate, ordering and performing treatments and interventions, ordering and review of laboratory studies, ordering and review of radiographic studies, pulse oximetry and re-evaluation of patient's condition.  MDM   Final diagnoses:  Acute upper GI  bleeding    Patient with acute upper GI bleed. Hypertensive and tachycardic. 2 large-bore IVs were placed. She was started on normal saline and emergent release blood was obtained. Patient also initiated on Protonix and octreotide. The patient has no known liver disease he has several stigmata of cirrhosis. Consult placed to gastroenterology and waiting return of call. Patient states he is feeling improved after IV fluids. Blood pressure is now systolic of 87.  Discussed with Dr Amedeo Plenty. Will see pt in ED and take to endoscopy suite.   Discussed with critical care and will see patient in the emergency department. Next  Patient with another episode of bloody emesis. Roughly 200 mL. Appears more coffee ground in nature. Blood pressure improving and last blood pressure is now 124/72.  Dr. Amedeo Plenty attempted endoscopy in  the emergency department. He was able to proceed due to massive amount of blood clot burden in the esophagus. Concern for risk of aspiration should he proceed. Decision was made to intubate the patient in the emergency department for airway protection. Given aspiration risk attempted intubation with chest sedation with etomidate. Unable to pass the bougie due to no spasm. Patient was then given succinylcholine with relaxation of cords and passage of ET tube. There was a lot of blood around the vocal cords. Patient had one episode of desaturation into the high 80s during repeated attempt. Chest x-ray with good position of ET tube. Infiltrate identified and radiologist questions possible aspiration.  Dr. Amedeo Plenty successfully scoped patient. Identified large distal esophagus ulcer. Small amount of bleeding notified. The ulcer was clipped.    Julianne Rice, MD 10/28/15 2208

## 2015-10-28 NOTE — ED Notes (Signed)
He has just spoken with the endoscopy nurse.  Prior to her visit he had vomited ~256ml of very dark (nearly black) bloody emesis.  As I write this he is feeling "much better" and he continues to visit with his wife.  Consent obtained for E.G.D.

## 2015-10-28 NOTE — ED Notes (Signed)
Time of intubation: 1847. See EMAR for meds administered.  As I write this; cxr has been done and per Dr. Edwena Felty order the ETT was advanced 1 cm; after which endoscopy resumed.

## 2015-10-28 NOTE — ED Notes (Signed)
He remains in no distress and is receiving the first of two units of emergency uncrossmatched blood.  He continues to visit lucidly with his wife.

## 2015-10-28 NOTE — ED Notes (Signed)
He has just arrived in my room.  He is awake, and alert and is conversing with his wife.  I have just added a 2nd liter of IVF to his left fa IV at wide open.

## 2015-10-28 NOTE — ED Notes (Signed)
He has just been intubated with 7.5 tube with capnographic color change confirmation. Pt. Tol. Procedure well.

## 2015-10-28 NOTE — H&P (Signed)
PULMONARY / CRITICAL CARE MEDICINE   Name: Johnny [name] MRN: [id number] DOB: 03-04-1951    ADMISSION DATE:  10/28/2015 CONSULTATION DATE:  10/28/15  REFERRING MD:  EDP  CHIEF COMPLAINT:  Vomiting blood  HISTORY OF PRESENT ILLNESS:   [name] is a 65yo WM who presented to the ED the afternoon of 2/12 with complaints of bloody vomit. He had vomited twice with large amounts of red vomitus with clots. He was noted to be hypotensive. Hgb was ~12 down from 16 in 04/2015. He was given IVF and transfused 2u pRBCs. GI was consulted and emergently performed EGD. Octreotide and protonix gtts were started. Family provides the history. Rare use of NSAIDs, rare ASA, no EtOH, long history of severe GERD, sub-optimally treated with prn omeprazole. PMH HTN, HLD and prostate ca s/p radical prostatectomy in 2010.  He has a living will, but the wife is unsure of the contents. For now they would like to remain Full Code.   PAST MEDICAL HISTORY :  He  has a past medical history of Hyperlipidemia; Hypertension; and Prostate cancer ([place]) (2008).  PAST SURGICAL HISTORY: He  has past surgical history that includes Colonoscopy (2006); Prostatectomy (11/2007); Wrist surgery (1998); and Cervical fusion (2007).  No Known Allergies  No current facility-administered medications on file prior to encounter.   Current Outpatient Prescriptions on File Prior to Encounter  Medication Sig  . b complex vitamins tablet Take 1 tablet by mouth daily.  . hydrochlorothiazide (MICROZIDE) 12.5 MG capsule TAKE 1 CAPSULE EVERY DAY  . losartan (COZAAR) 100 MG tablet TAKE 1 TABLET EVERY DAY  . Omega-3 Fatty Acids (OMEGA 3 PO) Take 1 tablet by mouth daily.     FAMILY HISTORY:  His has no family status information on file.   Married. Wife and daughter at bedside.  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 He does not have any smokeless tobacco history on file. He  reports that he does not drink alcohol or use illicit drugs.  REVIEW OF SYSTEMS:   Unable to obtain 2/2 intubated status  VITAL SIGNS: BP 117/62 mmHg  Pulse 91  Temp(Src) 97.7 F (36.5 C) (Axillary)  Resp 16  Ht 5\' 10"  (1.778 m)  SpO2 100%  HEMODYNAMICS:    VENTILATOR SETTINGS: Vent Mode:  [-] PRVC FiO2 (%):  [40 %-100 %] 40 % Set Rate:  [14 bmp] 14 bmp Vt Set:  [590 mL-680 mL] 590 mL PEEP:  [5 cmH20] 5 cmH20 Plateau Pressure:  [11 cmH20-14 cmH20] 14 cmH20  INTAKE / OUTPUT: I/O last 3 completed shifts: In: 3190 [I.V.:2500; Blood:690] Out: -   PHYSICAL EXAMINATION: General:  WNWD obese WM intubated, sedated Neuro:  Arouses to voice, sedated but moving all extremities, PERRL HEENT:  No gross abnormalities. Bloody secretions in oropharynx  Cardiovascular:  RRR s1, s2, no m/r/g. Distal pulses 2+ bilaterally Lungs:  Clear to auscultation bilaterally on anterior exam. No wheezes, rales, ronchi. Vent-assisted effort. Symmetrical expansion Abdomen: soft, tender to palpation in the epigastric region, mild distension, +bs, no masses or organomegaly Musculoskeletal:  Moves all extremities, no gross deformities Skin:  Venous stasis changes bilateral lower extremities with trace edema.   LABS:  BMET  Recent Labs Lab 10/28/15 1620 10/28/15 1643  NA 139 138  K 5.3* 5.4*  CL 104 103  CO2 19*  --   BUN 46* 68*  CREATININE 1.52* 1.20  GLUCOSE 274* 261*  Electrolytes  Recent Labs Lab 10/28/15 1620  CALCIUM 8.7*    CBC  Recent Labs Lab 10/28/15 1620 10/28/15 1643  WBC 18.0*  --   HGB 12.3* 12.9*  HCT 37.8* 38.0*  PLT 389  --     Coag's  Recent Labs Lab 10/28/15 1620  APTT 25  INR 1.16    Sepsis Markers No results for input(s): LATICACIDVEN, PROCALCITON, O2SATVEN in the last 168 hours.  ABG  Recent Labs Lab 10/28/15 2030  PHART 7.333*  PCO2ART 39.0  PO2ART 290*    Liver Enzymes  Recent Labs Lab 10/28/15 1620  AST 51*  ALT 43   ALKPHOS 49  BILITOT 1.6*  ALBUMIN 3.3*    Cardiac Enzymes  Recent Labs Lab 10/28/15 1620  TROPONINI <0.03    Glucose No results for input(s): GLUCAP in the last 168 hours.  Imaging Dg Chest 1 View  10/28/2015  CLINICAL DATA:  Status post intubation. History of hypertension and prostate cancer. EXAM: CHEST 1 VIEW COMPARISON:  Earlier the same date.  04/23/2006. FINDINGS: 1849 hours. Interval intubation. The endotracheal tube is in the midtrachea. There are lower lung volumes with increased left-greater-than-right basilar airspace opacities. The heart size and mediastinal contours are stable. There is no pleural effusion or pneumothorax. IMPRESSION: Satisfactorily positioned endotracheal tube. Left-greater-than-right basilar airspace opacities suspicious for aspiration. Electronically Signed   By: [name] M.D.   On: 10/28/2015 19:31   Dg Chest Port 1 View  10/28/2015  CLINICAL DATA:  Hematemesis today with generalized weakness. History of hypertension and prostate cancer. Nonsmoker. EXAM: PORTABLE CHEST 1 VIEW COMPARISON:  Chest radiographs 04/23/2006. FINDINGS: 1627 hours. The heart size and mediastinal contours are normal. The lungs are clear. There is no pleural effusion or pneumothorax. No acute osseous findings are identified. There are postsurgical changes status post lower cervical fusion. Telemetry leads overlie the chest. IMPRESSION: No active cardiopulmonary process. Electronically Signed   By: [name] M.D.   On: 10/28/2015 17:07    STUDIES:  EGD as below  CULTURES: None H pylori Ag pending  ANTIBIOTICS: None  SIGNIFICANT EVENTS: EGD 10/28/15 >> large distal esophageal ulcer s/p injection of epi and clipping  LINES/TUBES: ETT 10/28/15 OGT 10/28/15 Foley 10/28/15  DISCUSSION: 77M w/ acute upper gi bleeding associated with acute blood loss anemia, hypotension responsive to fluids / transfusion and s/p upper endoscopy which revealed a large distal  esophageal ulcer which was clipped. PMH significant only for hypertension.   ASSESSMENT / PLAN:  PULMONARY A: Intubated for airway protection P:   Keep intubated tonight SBT in AM  CARDIOVASCULAR A:  Symptomatic hypotension 2/2 acute blood loss anemia; responsive to fluids and transfusion Troponin okay, BP improved Hx hypertension  P:  Hold antihypertensives while BP borderline  RENAL A:   Acute kidney injury Cr 1.52 (baseline 0.94)  P:   Fluid resuscitate Trend Cr  GASTROINTESTINAL A:   Acute upper GI bleeding 2/2 large distal esophageal ulcer s/p clipping Hx GERD  P:   Continue octreotide and protonix gtt per GI recs Appreciate GI Await H pylori stool Ag NPO Supportive care w/ IVF, transfusions as needed   HEMATOLOGIC A:   Acute blood loss anemia 2/2 GI bleeding  P:  Trend Hgb q6h Monitor for bleeding Transfuse for Hgb <7 or to maintain Hgb 8 if active bleeding Maintain active type and cross Maintain large bore iv access x 2  INFECTIOUS A:   Leukocytosis likely reflects stress response No fever / chills / infectious  symptoms  P:   No antibiotics at this time Trend WBC  ENDOCRINE A:   Hyperglycemia - likely stress related. No hx of DMII    P:   Check Hgb A1c SSI  NEUROLOGIC A:   No acute issues  P:   RASS goal: -1 to 0   FAMILY  - Updates: Wife and daughter at bedside  - Inter-disciplinary family meet or Palliative Care meeting due by:  day 7  The patient is critically ill with multiple organ system failure and requires high complexity decision making for assessment and support, frequent evaluation and titration of therapies, advanced monitoring, review of radiographic studies and interpretation of complex data.   Critical Care Time devoted to patient care services, exclusive of separately billable procedures, described in this note is 55 minutes.   Dannielle Burn, MD [facility] [place] Pager: [phone]  10/28/2015, 10:02 PM

## 2015-10-29 ENCOUNTER — Encounter (HOSPITAL_COMMUNITY): Payer: Self-pay | Admitting: Gastroenterology

## 2015-10-29 DIAGNOSIS — K922 Gastrointestinal hemorrhage, unspecified: Secondary | ICD-10-CM

## 2015-10-29 DIAGNOSIS — N179 Acute kidney failure, unspecified: Secondary | ICD-10-CM

## 2015-10-29 DIAGNOSIS — R739 Hyperglycemia, unspecified: Secondary | ICD-10-CM

## 2015-10-29 DIAGNOSIS — R571 Hypovolemic shock: Secondary | ICD-10-CM

## 2015-10-29 LAB — MAGNESIUM: MAGNESIUM: 1.5 mg/dL — AB (ref 1.7–2.4)

## 2015-10-29 LAB — TYPE AND SCREEN
ABO/RH(D): A POS
Antibody Screen: NEGATIVE
UNIT DIVISION: 0
Unit division: 0

## 2015-10-29 LAB — BASIC METABOLIC PANEL
Anion gap: 8 (ref 5–15)
BUN: 38 mg/dL — AB (ref 6–20)
CO2: 22 mmol/L (ref 22–32)
CREATININE: 1 mg/dL (ref 0.61–1.24)
Calcium: 7.4 mg/dL — ABNORMAL LOW (ref 8.9–10.3)
Chloride: 112 mmol/L — ABNORMAL HIGH (ref 101–111)
GFR calc Af Amer: >60 mL/min (ref >60)
Glucose, Bld: 162 mg/dL — ABNORMAL HIGH (ref 65–99)
Potassium: 4.1 mmol/L (ref 3.5–5.1)
SODIUM: 142 mmol/L (ref 135–145)

## 2015-10-29 LAB — HEMOGLOBIN AND HEMATOCRIT, BLOOD
HCT: 32.8 % — ABNORMAL LOW (ref 39.0–52.0)
HEMATOCRIT: 32.5 % — AB (ref 39.0–52.0)
Hemoglobin: 10.8 g/dL — ABNORMAL LOW (ref 13.0–17.0)
Hemoglobin: 10.9 g/dL — ABNORMAL LOW (ref 13.0–17.0)

## 2015-10-29 LAB — GLUCOSE, CAPILLARY
GLUCOSE-CAPILLARY: 136 mg/dL — AB (ref 65–99)
GLUCOSE-CAPILLARY: 141 mg/dL — AB (ref 65–99)
GLUCOSE-CAPILLARY: 136 mg/dL — AB (ref 65–99)
GLUCOSE-CAPILLARY: 111 mg/dL — AB (ref 65–99)
Glucose-Capillary: 107 mg/dL — ABNORMAL HIGH (ref 65–99)

## 2015-10-29 LAB — CBC
HCT: 34.2 % — ABNORMAL LOW (ref 39.0–52.0)
HEMOGLOBIN: 11.1 g/dL — AB (ref 13.0–17.0)
MCH: 28.8 pg (ref 26.0–34.0)
MCHC: 32.5 g/dL (ref 30.0–36.0)
MCV: 88.6 fL (ref 78.0–100.0)
Platelets: 262 10*3/uL (ref 150–400)
RBC: 3.86 MIL/uL — AB (ref 4.22–5.81)
RDW: 14.5 % (ref 11.5–15.5)
WBC: 10.9 10*3/uL — AB (ref 4.0–10.5)

## 2015-10-29 LAB — TRIGLYCERIDES: Triglycerides: 202 mg/dL — ABNORMAL HIGH (ref <150)

## 2015-10-29 LAB — MRSA PCR SCREENING: MRSA by PCR: NEGATIVE

## 2015-10-29 LAB — PHOSPHORUS: Phosphorus: 2.6 mg/dL (ref 2.5–4.6)

## 2015-10-29 LAB — ABO/RH: ABO/RH(D): A POS

## 2015-10-29 MED ORDER — INSULIN ASPART 100 UNIT/ML ~~LOC~~ SOLN: 0.0000 [IU] | Freq: Every day | SUBCUTANEOUS | Status: DC

## 2015-10-29 MED ORDER — SODIUM CHLORIDE 0.9 % IV SOLN
INTRAVENOUS | Status: DC
  Administered 2015-10-30: 03:00:00 via INTRAVENOUS

## 2015-10-29 MED ORDER — CHLORHEXIDINE GLUCONATE 0.12% ORAL RINSE (MEDLINE KIT)
15.0000 mL | Freq: Two times a day (BID) | OROMUCOSAL | Status: DC
  Administered 2015-10-29: 15 mL via OROMUCOSAL

## 2015-10-29 MED ORDER — MAGNESIUM SULFATE 2 GM/50ML IV SOLN
2.0000 g | Freq: Once | INTRAVENOUS | Status: AC
  Administered 2015-10-29: 2 g via INTRAVENOUS
  Filled 2015-10-29: qty 50

## 2015-10-29 MED ORDER — INSULIN ASPART 100 UNIT/ML ~~LOC~~ SOLN
0.0000 [IU] | Freq: Three times a day (TID) | SUBCUTANEOUS | Status: DC
  Administered 2015-10-30 (×2): 1 [IU] via SUBCUTANEOUS
  Administered 2015-10-31: 2 [IU] via SUBCUTANEOUS

## 2015-10-29 MED ORDER — ANTISEPTIC ORAL RINSE SOLUTION (CORINZ)
7.0000 mL | Freq: Four times a day (QID) | OROMUCOSAL | Status: DC
  Administered 2015-10-29 – 2015-10-30 (×3): 7 mL via OROMUCOSAL

## 2015-10-29 NOTE — Progress Notes (Signed)
PULMONARY / CRITICAL CARE MEDICINE   Name: Johnny [name] MRN: [id number] DOB: Oct 04, 1950    ADMISSION DATE:  10/28/2015 CONSULTATION DATE:  10/28/15  REFERRING MD:  EDP  CHIEF COMPLAINT:  Vomiting blood  SUBJECTIVE:  RT reports pt weaning on 5/5, pulling ~ 1.3L Vt, no distress.   VITAL SIGNS: BP 152/84 mmHg  Pulse 99  Temp(Src) 99 F (37.2 C) (Axillary)  Resp 18  Ht 5\' 10"  (1.778 m)  Wt 232 lb 2.3 oz (105.3 kg)  BMI 33.31 kg/m2  SpO2 100%  HEMODYNAMICS:    VENTILATOR SETTINGS: Vent Mode:  [-] PSV;CPAP FiO2 (%):  [30 %-100 %] 30 % Set Rate:  [14 bmp] 14 bmp Vt Set:  [590 mL-680 mL] 590 mL PEEP:  [5 cmH20] 5 cmH20 Pressure Support:  [5 cmH20] 5 cmH20 Plateau Pressure:  [11 cmH20-15 cmH20] 13 cmH20  INTAKE / OUTPUT: I/O last 3 completed shifts: In: 3190 [I.V.:2500; Blood:690] Out: -   PHYSICAL EXAMINATION: General:  Well nourished/well developed adult male in NAD Neuro:  PERRL, follows two step commands, MAE HEENT:  No gross abnormalities. Dark brown secretions from supraglottic tube Cardiovascular:  RRR s1, s2, no m/r/g. Distal pulses 2+ bilaterally Lungs:  Clear to auscultation bilaterally on anterior exam. No wheezes, rales, ronchi. Symmetrical expansion Abdomen: soft, tender to palpation in the epigastric region, mild distension, +bs, no masses or organomegaly Musculoskeletal:  Moves all extremities, no gross deformities Skin:  Venous stasis changes bilateral lower extremities with trace edema.   LABS:  BMET  Recent Labs Lab 10/28/15 1620 10/28/15 1643 10/28/15 2154 10/29/15 0357  NA 139 138 140 142  K 5.3* 5.4* 5.1 4.1  CL 104 103 115* 112*  CO2 19*  --  20* 22  BUN 46* 68* 44* 38*  CREATININE 1.52* 1.20 1.02 1.00  GLUCOSE 274* 261* 188* 162*    Electrolytes  Recent Labs Lab 10/28/15 1620 10/28/15 2154 10/29/15 0357  CALCIUM 8.7* 7.4* 7.4*  MG  --   --  1.5*  PHOS  --   --  2.6    CBC  Recent Labs Lab 10/28/15 1620 10/28/15 1643  10/28/15 2154 10/29/15 0357  WBC 18.0*  --  13.8* 10.9*  HGB 12.3* 12.9* 12.4* 11.1*  HCT 37.8* 38.0* 38.1* 34.2*  PLT 389  --  273 262    Coag's  Recent Labs Lab 10/28/15 1620  APTT 25  INR 1.16    Sepsis Markers No results for input(s): LATICACIDVEN, PROCALCITON, O2SATVEN in the last 168 hours.  ABG  Recent Labs Lab 10/28/15 2030 10/28/15 2140  PHART 7.333* 7.336*  PCO2ART 39.0 38.6  PO2ART 290* 118*    Liver Enzymes  Recent Labs Lab 10/28/15 1620  AST 51*  ALT 43  ALKPHOS 49  BILITOT 1.6*  ALBUMIN 3.3*    Cardiac Enzymes  Recent Labs Lab 10/28/15 1620  TROPONINI <0.03    Glucose  Recent Labs Lab 10/28/15 2344 10/29/15 0338 10/29/15 0759  GLUCAP 149* 136* 141*    Imaging Dg Chest 1 View  10/28/2015  CLINICAL DATA:  Status post intubation. History of hypertension and prostate cancer. EXAM: CHEST 1 VIEW COMPARISON:  Earlier the same date.  04/23/2006. FINDINGS: 1849 hours. Interval intubation. The endotracheal tube is in the midtrachea. There are lower lung volumes with increased left-greater-than-right basilar airspace opacities. The heart size and mediastinal contours are stable. There is no pleural effusion or pneumothorax. IMPRESSION: Satisfactorily positioned endotracheal tube. Left-greater-than-right basilar airspace opacities suspicious for aspiration. Electronically  Signed   By: [name] M.D.   On: 10/28/2015 19:31   Dg Chest Port 1 View  10/28/2015  CLINICAL DATA:  Hematemesis today with generalized weakness. History of hypertension and prostate cancer. Nonsmoker. EXAM: PORTABLE CHEST 1 VIEW COMPARISON:  Chest radiographs 04/23/2006. FINDINGS: 1627 hours. The heart size and mediastinal contours are normal. The lungs are clear. There is no pleural effusion or pneumothorax. No acute osseous findings are identified. There are postsurgical changes status post lower cervical fusion. Telemetry leads overlie the chest. IMPRESSION: No active  cardiopulmonary process. Electronically Signed   By: [name] M.D.   On: 10/28/2015 17:07    STUDIES:  EGD 2/12 >>  large distal esophageal ulcer s/p injection of epi and clipping  CULTURES: H pylori Ag 2/12 >>  ANTIBIOTICS:   SIGNIFICANT EVENTS: 2/12  Admit with bloody emesis, EGD with distal esophageal ulcer s/p clipping + epi  LINES/TUBES: ETT 2/12 >> 2/13  Foley 2/12 >> 2/13   DISCUSSION: 65 y/o M w/ acute upper gi bleeding associated with acute blood loss anemia, hypotension responsive to fluids / transfusion and s/p upper endoscopy which revealed a large distal esophageal ulcer which was clipped (2/12). PMH significant only for hypertension.   ASSESSMENT / PLAN:  PULMONARY A: Intubated for airway protection P:   Meets extubation criteria am 2/13, proceed with extubation  VAP prevention measures Oral care per protocol   CARDIOVASCULAR A:  Symptomatic hypotension - 2/2 acute blood loss anemia; responsive to fluids and transfusion, resolved.  Neg troponin Hx hypertension  P:  Hold home antihypertensives Hemodynamic monitoring in ICU   RENAL A:   Acute kidney injury Cr 1.52 (baseline 0.94) Hypomag  P:   NS @ 50 ml/hr Trend BMP / UOP  Replace electrolytes as indicated  2gm Magnesium 2/13  GASTROINTESTINAL A:   Acute upper GI bleeding 2/2 large distal esophageal ulcer s/p clipping Hx GERD  P:   Continue octreotide and protonix gtt per GI recs Appreciate GI Await H pylori stool Ag NPO until cleared per GI post extubation  Supportive care w/ IVF, transfusions as needed   HEMATOLOGIC A:   Acute blood loss anemia 2/2 GI bleeding  P:  Trend Hgb q6h Monitor for bleeding Transfuse for Hgb <7 or to maintain Hgb 8 if active bleeding Maintain large bore iv access x 2  INFECTIOUS A:   Leukocytosis likely reflects stress response No fever / chills / infectious symptoms  P:   No antibiotics at this time Trend WBC / fever  curve  ENDOCRINE A:   Hyperglycemia - likely stress related. No hx of DMII    P:   Check Hgb A1c SSI  NEUROLOGIC A:   No acute issues  P:   D/C sedation protocol  Supportive care    FAMILY  - Updates: Patient and wife updated at bedside.    - Inter-disciplinary family meet or Palliative Care meeting due by:  day Linden, NP-C [place] Pgr: [phone] or if no answer [phone] 10/29/2015, 9:09 AM   PCCM Attending Note: Patient seen and examined with nurse practitioner. Please refer to her progress note which I reviewed in detail. Patient denies any abdominal pain. He denies any melena or hematochezia. Denies any nausea or hematemesis overnight. Patient was able to be successfully extubated this morning to room air.  BP 155/70 mmHg  Pulse 99  Temp(Src) 98.7 F (37.1 C) (Oral)  Resp 16  Ht 5\' 10"  (  1.778 m)  Wt 232 lb 2.3 oz (105.3 kg)  BMI 33.31 kg/m2  SpO2 100% Gen.: Sitting up in bed. No distress. Wife at bedside. Integument: Warm and dry. No rash on exposed skin. Pulmonary: Clear bilaterally to auscultation. Normal work of breathing. Abdomen: Soft. Protuberant. Nontender.  CBC Latest Ref Rng 10/29/2015 10/29/2015 10/29/2015  WBC 4.0 - 10.5 K/uL - - 10.9(H)  Hemoglobin 13.0 - 17.0 g/dL 10.9(L) 10.8(L) 11.1(L)  Hematocrit 39.0 - 52.0 % 32.5(L) 32.8(L) 34.2(L)  Platelets 150 - 400 K/uL - - 27   A/P: 65 year old male with acute blood loss from upper GI bleed. Status post distal esophageal ulcer clipping. Extubated this morning. Patient does have acute renal failure likely secondary to his attention from acute blood loss. Hypotension has now resolved. As he shows no sperm or signs of active bleeding we will transition his care to the hospitalist service.  1. Upper GI bleed: Secondary to esophageal ulcer area status post clipping. Management per GI recommendations. Trending hemoglobin daily with CBC. Currently on Protonix drip. 2. Shock:  Secondary to acute blood loss. Resolved. 3. Acute renal failure: Likely secondary to acute blood loss and shock. Continuing to trend urine output along with renal function daily. 4. Hypomagnesemia: Replacing with magnesium sulfate IV. Repeat magnesium level in AM. 5. Hyperglycemia: No history of diabetes. Checking hemoglobin A1c. Continuing Accu-Cheks with sliding scale insulin. 6. Diet: Per GI recommendations. 7. Prophylaxis: SCDs & Protonix drip.  Transition care to hospitalist service. PCCM will sign off 2/14.  Sonia Baller Ashok Cordia, M.D. [facility] Pager:  [phone] After 3pm or if no response, call [phone]

## 2015-10-29 NOTE — Progress Notes (Signed)
5ml of versed wasted in sink. Witnessed by Katherine Mantle, RN.

## 2015-10-29 NOTE — Progress Notes (Signed)
[facility] Progress Note  Subjective: Patient just extubated this morning feels tired and weak  Objective: Vital signs in last 24 hours: Temp:  [97.7 F (36.5 C)-99 F (37.2 C)] 99 F (37.2 C) (02/13 0800) Pulse Rate:  [69-119] 99 (02/13 0908) Resp:  [11-32] 18 (02/13 0908) BP: (75-190)/(44-110) 152/84 mmHg (02/13 0908) SpO2:  [94 %-100 %] 100 % (02/13 0908) FiO2 (%):  [30 %-100 %] 30 % (02/13 0838) Weight:  [105.3 kg (232 lb 2.3 oz)] 105.3 kg (232 lb 2.3 oz) (02/13 0345) Weight change:    PE: Abdomen soft  Lab Results: Results for orders placed or performed during the hospital encounter of 10/28/15 (from the past 24 hour(s))  CBC with Differential/Platelet     Status: Abnormal   Collection Time: 10/28/15  4:20 PM  Result Value Ref Range   WBC 18.0 (H) 4.0 - 10.5 K/uL   RBC 4.35 4.22 - 5.81 MIL/uL   Hemoglobin 12.3 (L) 13.0 - 17.0 g/dL   HCT 37.8 (L) 39.0 - 52.0 %   MCV 86.9 78.0 - 100.0 fL   MCH 28.3 26.0 - 34.0 pg   MCHC 32.5 30.0 - 36.0 g/dL   RDW 14.0 11.5 - 15.5 %   Platelets 389 150 - 400 K/uL   Neutrophils Relative % 79 %   Neutro Abs 14.1 (H) 1.7 - 7.7 K/uL   Lymphocytes Relative 14 %   Lymphs Abs 2.6 0.7 - 4.0 K/uL   Monocytes Relative 7 %   Monocytes Absolute 1.2 (H) 0.1 - 1.0 K/uL   Eosinophils Relative 0 %   Eosinophils Absolute 0.1 0.0 - 0.7 K/uL   Basophils Relative 0 %   Basophils Absolute 0.0 0.0 - 0.1 K/uL  Comprehensive metabolic panel     Status: Abnormal   Collection Time: 10/28/15  4:20 PM  Result Value Ref Range   Sodium 139 135 - 145 mmol/L   Potassium 5.3 (H) 3.5 - 5.1 mmol/L   Chloride 104 101 - 111 mmol/L   CO2 19 (L) 22 - 32 mmol/L   Glucose, Bld 274 (H) 65 - 99 mg/dL   BUN 46 (H) 6 - 20 mg/dL   Creatinine, Ser 1.52 (H) 0.61 - 1.24 mg/dL   Calcium 8.7 (L) 8.9 - 10.3 mg/dL   Total Protein 6.0 (L) 6.5 - 8.1 g/dL   Albumin 3.3 (L) 3.5 - 5.0 g/dL   AST 51 (H) 15 - 41 U/L   ALT 43 17 - 63 U/L   Alkaline Phosphatase 49 38 -  126 U/L   Total Bilirubin 1.6 (H) 0.3 - 1.2 mg/dL   GFR calc non Af Amer 47 (L) >60 mL/min   GFR calc Af Amer 54 (L) >60 mL/min   Anion gap 16 (H) 5 - 15  Protime-INR     Status: None   Collection Time: 10/28/15  4:20 PM  Result Value Ref Range   Prothrombin Time 15.0 11.6 - 15.2 seconds   INR 1.16 0.00 - 1.49  Lipase, blood     Status: None   Collection Time: 10/28/15  4:20 PM  Result Value Ref Range   Lipase 23 11 - 51 U/L  Type and screen     Status: None   Collection Time: 10/28/15  4:20 PM  Result Value Ref Range   ABO/RH(D) A POS    Antibody Screen NEG    Sample Expiration 10/31/2015    Unit Number [id number]    Blood Component Type RED CELLS,LR  Unit division 00    Status of Unit ISSUED,FINAL    Unit tag comment VERBAL ORDERS PER DR YELVERTON    Transfusion Status OK TO TRANSFUSE    Crossmatch Result COMPATIBLE    Unit Number [id number]    Blood Component Type RED CELLS,LR    Unit division 00    Status of Unit ISSUED,FINAL    Unit tag comment VERBAL ORDERS PER DR YELVERTON    Transfusion Status OK TO TRANSFUSE    Crossmatch Result COMPATIBLE   Troponin I     Status: None   Collection Time: 10/28/15  4:20 PM  Result Value Ref Range   Troponin I <0.03 <0.031 ng/mL  APTT     Status: None   Collection Time: 10/28/15  4:20 PM  Result Value Ref Range   aPTT 25 24 - 37 seconds  Prepare RBC     Status: None   Collection Time: 10/28/15  4:22 PM  Result Value Ref Range   Order Confirmation ORDER PROCESSED BY BLOOD BANK   ABO/Rh     Status: None   Collection Time: 10/28/15  4:22 PM  Result Value Ref Range   ABO/RH(D) A POS   I-stat troponin, ED     Status: None   Collection Time: 10/28/15  4:41 PM  Result Value Ref Range   Troponin i, poc 0.02 0.00 - 0.08 ng/mL   Comment 3          I-stat chem 8, ed     Status: Abnormal   Collection Time: 10/28/15  4:43 PM  Result Value Ref Range   Sodium 138 135 - 145 mmol/L   Potassium 5.4 (H) 3.5 - 5.1 mmol/L    Chloride 103 101 - 111 mmol/L   BUN 68 (H) 6 - 20 mg/dL   Creatinine, Ser 1.20 0.61 - 1.24 mg/dL   Glucose, Bld 261 (H) 65 - 99 mg/dL   Calcium, Ion 1.08 (L) 1.13 - 1.30 mmol/L   TCO2 21 0 - 100 mmol/L   Hemoglobin 12.9 (L) 13.0 - 17.0 g/dL   HCT 38.0 (L) 39.0 - 52.0 %  Blood gas, arterial     Status: Abnormal   Collection Time: 10/28/15  8:30 PM  Result Value Ref Range   FIO2 1.00    Delivery systems VENTILATOR    Mode PRESSURE REGULATED VOLUME CONTROL    VT 680 mL   LHR 14 resp/min   Peep/cpap 5.0 cm H20   pH, Arterial 7.333 (L) 7.350 - 7.450   pCO2 arterial 39.0 35.0 - 45.0 mmHg   pO2, Arterial 290 (H) 80.0 - 100.0 mmHg   Bicarbonate 20.1 20.0 - 24.0 mEq/L   TCO2 18.4 0 - 100 mmol/L   Acid-base deficit 4.8 (H) 0.0 - 2.0 mmol/L   O2 Saturation 99.5 %   Patient temperature 98.7    Collection site RIGHT RADIAL    Drawn by [phone]    Sample type ARTERIAL DRAW    Allens test (pass/fail) PASS PASS  Blood gas, arterial     Status: Abnormal   Collection Time: 10/28/15  9:40 PM  Result Value Ref Range   FIO2 0.40    Delivery systems VENTILATOR    Mode PRESSURE REGULATED VOLUME CONTROL    VT 590 mL   LHR 14 resp/min   Peep/cpap 5.0 cm H20   pH, Arterial 7.336 (L) 7.350 - 7.450   pCO2 arterial 38.6 35.0 - 45.0 mmHg   pO2, Arterial 118 (H) 80.0 - 100.0  mmHg   Bicarbonate 20.0 20.0 - 24.0 mEq/L   TCO2 18.5 0 - 100 mmol/L   Acid-base deficit 4.8 (H) 0.0 - 2.0 mmol/L   O2 Saturation 98.2 %   Patient temperature 98.7    Collection site RIGHT RADIAL    Drawn by [phone]    Sample type ARTERIAL DRAW    Allens test (pass/fail) PASS PASS  CBC with Differential/Platelet     Status: Abnormal   Collection Time: 10/28/15  9:54 PM  Result Value Ref Range   WBC 13.8 (H) 4.0 - 10.5 K/uL   RBC 4.30 4.22 - 5.81 MIL/uL   Hemoglobin 12.4 (L) 13.0 - 17.0 g/dL   HCT 38.1 (L) 39.0 - 52.0 %   MCV 88.6 78.0 - 100.0 fL   MCH 28.8 26.0 - 34.0 pg   MCHC 32.5 30.0 - 36.0 g/dL   RDW 14.1 11.5 - 15.5  %   Platelets 273 150 - 400 K/uL   Neutrophils Relative % 82 %   Neutro Abs 11.4 (H) 1.7 - 7.7 K/uL   Lymphocytes Relative 9 %   Lymphs Abs 1.2 0.7 - 4.0 K/uL   Monocytes Relative 9 %   Monocytes Absolute 1.3 (H) 0.1 - 1.0 K/uL   Eosinophils Relative 0 %   Eosinophils Absolute 0.0 0.0 - 0.7 K/uL   Basophils Relative 0 %   Basophils Absolute 0.0 0.0 - 0.1 K/uL  Basic metabolic panel     Status: Abnormal   Collection Time: 10/28/15  9:54 PM  Result Value Ref Range   Sodium 140 135 - 145 mmol/L   Potassium 5.1 3.5 - 5.1 mmol/L   Chloride 115 (H) 101 - 111 mmol/L   CO2 20 (L) 22 - 32 mmol/L   Glucose, Bld 188 (H) 65 - 99 mg/dL   BUN 44 (H) 6 - 20 mg/dL   Creatinine, Ser 1.02 0.61 - 1.24 mg/dL   Calcium 7.4 (L) 8.9 - 10.3 mg/dL   GFR calc non Af Amer >60 >60 mL/min   GFR calc Af Amer >60 >60 mL/min   Anion gap 5 5 - 15  MRSA PCR Screening     Status: None   Collection Time: 10/28/15 11:00 PM  Result Value Ref Range   MRSA by PCR NEGATIVE NEGATIVE  Glucose, capillary     Status: Abnormal   Collection Time: 10/28/15 11:44 PM  Result Value Ref Range   Glucose-Capillary 149 (H) 65 - 99 mg/dL  Triglycerides     Status: Abnormal   Collection Time: 10/29/15 12:13 AM  Result Value Ref Range   Triglycerides 202 (H) <150 mg/dL  Glucose, capillary     Status: Abnormal   Collection Time: 10/29/15  3:38 AM  Result Value Ref Range   Glucose-Capillary 136 (H) 65 - 99 mg/dL  CBC     Status: Abnormal   Collection Time: 10/29/15  3:57 AM  Result Value Ref Range   WBC 10.9 (H) 4.0 - 10.5 K/uL   RBC 3.86 (L) 4.22 - 5.81 MIL/uL   Hemoglobin 11.1 (L) 13.0 - 17.0 g/dL   HCT 34.2 (L) 39.0 - 52.0 %   MCV 88.6 78.0 - 100.0 fL   MCH 28.8 26.0 - 34.0 pg   MCHC 32.5 30.0 - 36.0 g/dL   RDW 14.5 11.5 - 15.5 %   Platelets 262 150 - 400 K/uL  Basic metabolic panel     Status: Abnormal   Collection Time: 10/29/15  3:57 AM  Result Value Ref  Range   Sodium 142 135 - 145 mmol/L   Potassium 4.1 3.5 -  5.1 mmol/L   Chloride 112 (H) 101 - 111 mmol/L   CO2 22 22 - 32 mmol/L   Glucose, Bld 162 (H) 65 - 99 mg/dL   BUN 38 (H) 6 - 20 mg/dL   Creatinine, Ser 1.00 0.61 - 1.24 mg/dL   Calcium 7.4 (L) 8.9 - 10.3 mg/dL   GFR calc non Af Amer >60 >60 mL/min   GFR calc Af Amer >60 >60 mL/min   Anion gap 8 5 - 15  Magnesium     Status: Abnormal   Collection Time: 10/29/15  3:57 AM  Result Value Ref Range   Magnesium 1.5 (L) 1.7 - 2.4 mg/dL  Phosphorus     Status: None   Collection Time: 10/29/15  3:57 AM  Result Value Ref Range   Phosphorus 2.6 2.5 - 4.6 mg/dL  Glucose, capillary     Status: Abnormal   Collection Time: 10/29/15  7:59 AM  Result Value Ref Range   Glucose-Capillary 141 (H) 65 - 99 mg/dL    Studies/Results: Dg Chest 1 View  10/28/2015  CLINICAL DATA:  Status post intubation. History of hypertension and prostate cancer. EXAM: CHEST 1 VIEW COMPARISON:  Earlier the same date.  04/23/2006. FINDINGS: 1849 hours. Interval intubation. The endotracheal tube is in the midtrachea. There are lower lung volumes with increased left-greater-than-right basilar airspace opacities. The heart size and mediastinal contours are stable. There is no pleural effusion or pneumothorax. IMPRESSION: Satisfactorily positioned endotracheal tube. Left-greater-than-right basilar airspace opacities suspicious for aspiration. Electronically Signed   By: [name] M.D.   On: 10/28/2015 19:31   Dg Chest Port 1 View  10/28/2015  CLINICAL DATA:  Hematemesis today with generalized weakness. History of hypertension and prostate cancer. Nonsmoker. EXAM: PORTABLE CHEST 1 VIEW COMPARISON:  Chest radiographs 04/23/2006. FINDINGS: 1627 hours. The heart size and mediastinal contours are normal. The lungs are clear. There is no pleural effusion or pneumothorax. No acute osseous findings are identified. There are postsurgical changes status post lower cervical fusion. Telemetry leads overlie the chest. IMPRESSION: No active  cardiopulmonary process. Electronically Signed   By: [name] M.D.   On: 10/28/2015 17:07      Assessment: Status post hemorrhage from esophageal ulcer Severe reflux esophagitis Barrett's Esophagus  Plan: Continue high-dose PPI probable switch to oral tomorrow Clear liquid diet and advance as tolerated Monitor stools and hemoglobin for recurrent bleeding Will need colonoscopy and repeat EGD in 2-3 months    Lendora Keys C 10/29/2015, 9:44 AM  Pager [phone] If no answer or after 5 PM call [phone]

## 2015-10-29 NOTE — Care Management Note (Signed)
Case Management Note  Patient Details  Name: Johnny [name] MRN: [id number] Date of Birth: 15-Feb-1951  Subjective/Objective:             Hypotensive state, resp distress requiring intubationx24hours. Fi02 at 30% [place]/anemia and bld product transfusion.       Action/Plan:Date: October 29, 2015 Chart reviewed for concurrent status and case management needs. Will continue to follow patient for changes and needs: Velva Harman, BSN, RN, [place]   [phone]   Expected Discharge Date:                  Expected Discharge Plan:  Home/Self Care  In-House Referral:  NA  Discharge planning Services  CM Consult  Post Acute Care Choice:  NA Choice offered to:  NA  DME Arranged:    DME Agency:     HH Arranged:    [place] Agency:     Status of Service:  Completed, signed off  Medicare Important Message Given:    Date Medicare IM Given:    Medicare IM give by:    Date Additional Medicare IM Given:    Additional Medicare Important Message give by:     If discussed at [place] of Stay Meetings, dates discussed:    Additional Comments:  Leeroy Cha, RN 10/29/2015, 10:55 AM

## 2015-10-29 NOTE — Procedures (Signed)
Extubation Procedure Note  Patient Details:   Name: Johnny [name] DOB: 12-23-1950 MRN: [id number]    Evaluation  O2 sats: stable throughout Complications: No apparent complications Patient did tolerate procedure well. Bilateral Breath Sounds: Clear, Diminished Suctioning: Airway Yes   IS = 2000  Irish Elders Royal 10/29/2015, 9:11 AM

## 2015-10-30 ENCOUNTER — Inpatient Hospital Stay (HOSPITAL_COMMUNITY): Payer: BC Managed Care – PPO

## 2015-10-30 DIAGNOSIS — R579 Shock, unspecified: Secondary | ICD-10-CM

## 2015-10-30 DIAGNOSIS — K22719 Barrett's esophagus with dysplasia, unspecified: Secondary | ICD-10-CM

## 2015-10-30 LAB — CBC
HCT: 27.7 % — ABNORMAL LOW (ref 39.0–52.0)
HEMOGLOBIN: 9.1 g/dL — AB (ref 13.0–17.0)
MCH: 29.5 pg (ref 26.0–34.0)
MCHC: 32.9 g/dL (ref 30.0–36.0)
MCV: 89.9 fL (ref 78.0–100.0)
PLATELETS: 190 10*3/uL (ref 150–400)
RBC: 3.08 MIL/uL — AB (ref 4.22–5.81)
RDW: 15 % (ref 11.5–15.5)
WBC: 6.3 10*3/uL (ref 4.0–10.5)

## 2015-10-30 LAB — GLUCOSE, CAPILLARY
GLUCOSE-CAPILLARY: 127 mg/dL — AB (ref 65–99)
GLUCOSE-CAPILLARY: 141 mg/dL — AB (ref 65–99)
GLUCOSE-CAPILLARY: 114 mg/dL — AB (ref 65–99)
Glucose-Capillary: 120 mg/dL — ABNORMAL HIGH (ref 65–99)

## 2015-10-30 LAB — BASIC METABOLIC PANEL
ANION GAP: 7 (ref 5–15)
BUN: 17 mg/dL (ref 6–20)
CALCIUM: 7.8 mg/dL — AB (ref 8.9–10.3)
CHLORIDE: 113 mmol/L — AB (ref 101–111)
CO2: 23 mmol/L (ref 22–32)
Creatinine, Ser: 0.73 mg/dL (ref 0.61–1.24)
GFR calc non Af Amer: >60 mL/min (ref >60)
Glucose, Bld: 133 mg/dL — ABNORMAL HIGH (ref 65–99)
Potassium: 3.8 mmol/L (ref 3.5–5.1)
Sodium: 143 mmol/L (ref 135–145)

## 2015-10-30 LAB — HEMOGLOBIN A1C
HEMOGLOBIN A1C: 6.4 % — AB (ref 4.8–5.6)
Mean Plasma Glucose: 137 mg/dL

## 2015-10-30 LAB — HEMOGLOBIN AND HEMATOCRIT, BLOOD
HCT: 28.8 % — ABNORMAL LOW (ref 39.0–52.0)
Hemoglobin: 9.8 g/dL — ABNORMAL LOW (ref 13.0–17.0)

## 2015-10-30 LAB — MAGNESIUM: Magnesium: 1.9 mg/dL (ref 1.7–2.4)

## 2015-10-30 MED ORDER — BOOST / RESOURCE BREEZE PO LIQD
1.0000 | Freq: Three times a day (TID) | ORAL | Status: DC
  Administered 2015-10-30 – 2015-10-31 (×3): 1 via ORAL

## 2015-10-30 NOTE — Progress Notes (Signed)
[facility] PROGRESS NOTE  Johnny [name] [id number] DOB: [birth year] DOA: 10/28/2015 PCP: [name], MD  Brief Summary  The patient is a 65 year old male who presented with hypovolemic shock secondary to hematemesis.  He was admitted to ICU and intubated for airway protection, seen quickly by gastroneurology who performed upper endoscopy. He was found to have an esophageal ulcer. He had epinephrine injection and Endo Clip placed and appeared to have hemostasis. He has been transfused a total of 2 units PRBC since admission. Gradually advancing diet. Home soon.  Assessment/Plan  Hypovolemic shock secondary to acute blood loss anemia secondary to esophageal ulceration. He is status post clipping and epinephrine injection which achieved hemostasis. Blood pressures are now stabilized. His hemoglobin has remained approximately stable for the last day however his hemoglobin this morning dropped slightly. I suspect that this is hemodilution as he has not had any obvious bleeding.   -  Repeat H&H this afternoon -   advanced to full liquid diet  -  Transfer to a medical surgical bed  -  Continue Protonix infusion  -  Follow-up H. pylori stool antigen -  Start iron supplementation at discharge  Barrett's esophagus -  F/u with GI for routine screening  Acute kidney injury secondary to hypovolemic shock, resolved with IV fluids and blood transfusion  Leukocytosis secondary to acute anemia and stress has resolved  Hyperglycemia, likely stress reaction in the setting of borderline diabetes -  Hemoglobin A1c 6.4 -  Consider initiation of metformin at discharge -  Continue sliding scale insulin  Diet: Full liquid diet Access: PIV IVF: Off Proph:  SCDs  Code Status:Full code Family Communication: patient alone Disposition Plan:  Possibly home Wednesday if tolerating a soft diet and hemoglobin is stable   Consultants: [facility], Dr. Amedeo Plenty  Produres:  Clip and  epi to bleeding esophageal ulcer  Antibiotics:  none   HPI/Subjective:  States he is breathing comfortably, denies hematemesis.  He has not had a recent bowel movement yet.   Objective: Filed Vitals:   10/30/15 1100 10/30/15 1200 10/30/15 1300 10/30/15 1430  BP: 124/65 150/73  145/71  Pulse: 78 78  82  Temp:   98.4 F (36.9 C) 98.7 F (37.1 C)  TempSrc:    Oral  Resp: 16 13  17   Height:    5\' 10"  (1.778 m)  Weight:  105.8 kg (233 lb 4 oz)    SpO2: 100% 93%  100%    Intake/Output Summary (Last 24 hours) at 10/30/15 1829 Last data filed at 10/30/15 1300  Gross per 24 hour  Intake   1315 ml  Output   1450 ml  Net   -135 ml   Filed Weights   10/28/15 2300 10/29/15 0345 10/30/15 1200  Weight: 105.3 kg (232 lb 2.3 oz) 105.3 kg (232 lb 2.3 oz) 105.8 kg (233 lb 4 oz)   Body mass index is 33.47 kg/(m^2).  Exam:   General:   obese male, No acute distress  HEENT:  NCAT, MMM  Cardiovascular:  RRR, nl S1, S2 no mrg, 2+ pulses, warm extremities  Respiratory:  CTAB, no increased WOB  Abdomen:   NABS, soft, NT/ND  MSK:   Normal tone and bulk, no LEE  Neuro:  Grossly intact  Data Reviewed: Basic Metabolic Panel:  Recent Labs Lab 10/28/15 1620 10/28/15 1643 10/28/15 2154 10/29/15 0357 10/30/15 0326  NA 139 138 140 142 143  K 5.3* 5.4* 5.1 4.1 3.8  CL 104 103 115* 112*  113*  CO2 19*  --  20* 22 23  GLUCOSE 274* 261* 188* 162* 133*  BUN 46* 68* 44* 38* 17  CREATININE 1.52* 1.20 1.02 1.00 0.73  CALCIUM 8.7*  --  7.4* 7.4* 7.8*  MG  --   --   --  1.5* 1.9  PHOS  --   --   --  2.6  --    Liver Function Tests:  Recent Labs Lab 10/28/15 1620  AST 51*  ALT 43  ALKPHOS 49  BILITOT 1.6*  PROT 6.0*  ALBUMIN 3.3*    Recent Labs Lab 10/28/15 1620  LIPASE 23   No results for input(s): AMMONIA in the last 168 hours. CBC:  Recent Labs Lab 10/28/15 1620  10/28/15 2154 10/29/15 0357 10/29/15 1014 10/29/15 1648 10/30/15 0326 10/30/15 1445  WBC  18.0*  --  13.8* 10.9*  --   --  6.3  --   NEUTROABS 14.1*  --  11.4*  --   --   --   --   --   HGB 12.3*  < > 12.4* 11.1* 10.8* 10.9* 9.1* 9.8*  HCT 37.8*  < > 38.1* 34.2* 32.8* 32.5* 27.7* 28.8*  MCV 86.9  --  88.6 88.6  --   --  89.9  --   PLT 389  --  273 262  --   --  190  --   < > = values in this interval not displayed.  Recent Results (from the past 240 hour(s))  MRSA PCR Screening     Status: None   Collection Time: 10/28/15 11:00 PM  Result Value Ref Range Status   MRSA by PCR NEGATIVE NEGATIVE Final    Comment:        The GeneXpert MRSA Assay (FDA approved for NASAL specimens only), is one component of a comprehensive MRSA colonization surveillance program. It is not intended to diagnose MRSA infection nor to guide or monitor treatment for MRSA infections.      Studies: Dg Chest 1 View  10/28/2015  CLINICAL DATA:  Status post intubation. History of hypertension and prostate cancer. EXAM: CHEST 1 VIEW COMPARISON:  Earlier the same date.  04/23/2006. FINDINGS: 1849 hours. Interval intubation. The endotracheal tube is in the midtrachea. There are lower lung volumes with increased left-greater-than-right basilar airspace opacities. The heart size and mediastinal contours are stable. There is no pleural effusion or pneumothorax. IMPRESSION: Satisfactorily positioned endotracheal tube. Left-greater-than-right basilar airspace opacities suspicious for aspiration. Electronically Signed   By: [name] M.D.   On: 10/28/2015 19:31   Dg Chest Port 1 View  10/30/2015  CLINICAL DATA:  GI bleed in weakness. EXAM: PORTABLE CHEST 1 VIEW COMPARISON:  10/28/2015 FINDINGS: Anterior cervical plate and screw fixator.  Interval extubation. Linear opacities at both lung bases favoring subsegmental atelectasis. Thoracic spondylosis. The patient is rotated to the bright on today's radiograph, reducing diagnostic sensitivity and specificity. Heart size within normal limits. IMPRESSION: 1.  Bibasilar subsegmental atelectasis. Much of the opacity at the left lung base has improved. 2. Thoracic spondylosis. Electronically Signed   By: Van Clines M.D.   On: 10/30/2015 07:19    Scheduled Meds: . feeding supplement  1 Container Oral TID BM  . insulin aspart  0-5 Units Subcutaneous QHS  . insulin aspart  0-9 Units Subcutaneous TID WC   Continuous Infusions: . pantoprozole (PROTONIX) infusion 8 mg/hr (10/30/15 0238)    Active Problems:   Upper GI bleeding    Time spent: 30  min    Johnny [name], [facility] Pager [phone]. If 7PM-7AM, please contact night-coverage at [email or web address], password [facility] 10/30/2015, 6:29 PM  LOS: 2 days

## 2015-10-31 DIAGNOSIS — K2210 Ulcer of esophagus without bleeding: Secondary | ICD-10-CM

## 2015-10-31 DIAGNOSIS — D62 Acute posthemorrhagic anemia: Secondary | ICD-10-CM

## 2015-10-31 LAB — CBC
HCT: 28.2 % — ABNORMAL LOW (ref 39.0–52.0)
HEMOGLOBIN: 9.1 g/dL — AB (ref 13.0–17.0)
MCH: 28.8 pg (ref 26.0–34.0)
MCHC: 32.3 g/dL (ref 30.0–36.0)
MCV: 89.2 fL (ref 78.0–100.0)
PLATELETS: 185 10*3/uL (ref 150–400)
RBC: 3.16 MIL/uL — AB (ref 4.22–5.81)
RDW: 14.8 % (ref 11.5–15.5)
WBC: 5.9 10*3/uL (ref 4.0–10.5)

## 2015-10-31 LAB — GLUCOSE, CAPILLARY
GLUCOSE-CAPILLARY: 145 mg/dL — AB (ref 65–99)
Glucose-Capillary: 146 mg/dL — ABNORMAL HIGH (ref 65–99)

## 2015-10-31 MED ORDER — FERROUS SULFATE 325 (65 FE) MG PO TABS: 325.0000 mg | ORAL_TABLET | Freq: Every day | ORAL | Status: DC

## 2015-10-31 MED ORDER — PANTOPRAZOLE SODIUM 40 MG PO TBEC: 40.0000 mg | DELAYED_RELEASE_TABLET | Freq: Two times a day (BID) | ORAL | Status: DC

## 2015-10-31 NOTE — Progress Notes (Signed)
NUTRITION NOTE  Pt seen for consult for borderline DM. CBGs: 107-145 mg/dL. Pt on Soft, low Na diet and eating well, no intakes documented. Pt reports that he feels much better since admission; pt to d/c this afternoon. Provided with pt of handout of My Plate visual and discussed this with him. Focused on each food group and its importance as well as items to limit from each food group when possible. Talked with pt about the importance of overall healthy eating with treats at times, but not on a daily basis. Pt reports plan to begin eating healthier PTA and that this admission has made him grasp the need to make these adjustments in his daily life.  Pt asks questions concerning V8 juice and Boost nutrition supplements. All questions and concerns were addressed and pt denies any further nutrition-related needs at this time. Medications and labs reviewed.    Johnny [name], RD, LDN Inpatient Clinical Dietitian Pager # [phone] After hours/weekend pager # [phone]

## 2015-10-31 NOTE — Discharge Instructions (Signed)

## 2015-10-31 NOTE — Progress Notes (Signed)
[facility] Progress Note  Subjective: Patient doing well, tolerating diet.  Objective: Vital signs in last 24 hours: Temp:  [98.4 F (36.9 C)-99 F (37.2 C)] 98.6 F (37 C) (02/15 0528) Pulse Rate:  [78-83] 81 (02/15 0528) Resp:  [13-20] 20 (02/15 0528) BP: (132-150)/(62-74) 132/74 mmHg (02/15 0528) SpO2:  [93 %-100 %] 97 % (02/15 0528) Weight:  [105.8 kg (233 lb 4 oz)] 105.8 kg (233 lb 4 oz) (02/14 1200) Weight change:    PE: Alert oriented, color good.  Lab Results: Results for orders placed or performed during the hospital encounter of 10/28/15 (from the past 24 hour(s))  Glucose, capillary     Status: Abnormal   Collection Time: 10/30/15 11:49 AM  Result Value Ref Range   Glucose-Capillary 120 (H) 65 - 99 mg/dL  Hemoglobin and hematocrit, blood     Status: Abnormal   Collection Time: 10/30/15  2:45 PM  Result Value Ref Range   Hemoglobin 9.8 (L) 13.0 - 17.0 g/dL   HCT 28.8 (L) 39.0 - 52.0 %  Glucose, capillary     Status: Abnormal   Collection Time: 10/30/15  6:04 PM  Result Value Ref Range   Glucose-Capillary 141 (H) 65 - 99 mg/dL  Glucose, capillary     Status: Abnormal   Collection Time: 10/30/15 10:27 PM  Result Value Ref Range   Glucose-Capillary 114 (H) 65 - 99 mg/dL  CBC     Status: Abnormal   Collection Time: 10/31/15  4:49 AM  Result Value Ref Range   WBC 5.9 4.0 - 10.5 K/uL   RBC 3.16 (L) 4.22 - 5.81 MIL/uL   Hemoglobin 9.1 (L) 13.0 - 17.0 g/dL   HCT 28.2 (L) 39.0 - 52.0 %   MCV 89.2 78.0 - 100.0 fL   MCH 28.8 26.0 - 34.0 pg   MCHC 32.3 30.0 - 36.0 g/dL   RDW 14.8 11.5 - 15.5 %   Platelets 185 150 - 400 K/uL  Glucose, capillary     Status: Abnormal   Collection Time: 10/31/15  7:43 AM  Result Value Ref Range   Glucose-Capillary 145 (H) 65 - 99 mg/dL    Studies/Results: Dg Chest Port 1 View  10/30/2015  CLINICAL DATA:  GI bleed in weakness. EXAM: PORTABLE CHEST 1 VIEW COMPARISON:  10/28/2015 FINDINGS: Anterior cervical plate and  screw fixator.  Interval extubation. Linear opacities at both lung bases favoring subsegmental atelectasis. Thoracic spondylosis. The patient is rotated to the bright on today's radiograph, reducing diagnostic sensitivity and specificity. Heart size within normal limits. IMPRESSION: 1. Bibasilar subsegmental atelectasis. Much of the opacity at the left lung base has improved. 2. Thoracic spondylosis. Electronically Signed   By: Van Clines M.D.   On: 10/30/2015 07:19      Assessment: Esophageal ulcer status post hemorrhage Barrett's esophagus Severe gastroesophageal reflux with hiatal hernia  Plan: Okay to discharge from GI standpoint on double dose PPI indefinitely We'll follow-up in 3 or 4 weeks in my office. Will plan combined repeat EGD and colonoscopy in 2 months.    Elvin Mccartin C 10/31/2015, 11:05 AM  Pager [phone] If no answer or after 5 PM call [phone]

## 2015-10-31 NOTE — Discharge Summary (Addendum)
Physician Discharge Summary  Johnny [name] [id number] DOB: Feb 01, 1951 DOA: 10/28/2015  PCP: [name], MD  Admit date: 10/28/2015 Discharge date: 10/31/2015  Recommendations for Outpatient Follow-up:  1. Patient instructed to take Protonix 40 mg twice daily for at least first month. Then he can continue once a day regimen.  Discharge Diagnoses:  Active Problems:   Upper GI bleeding    Discharge Condition: stable   Diet recommendation: as tolerated   History of present illness:  65 year old male who presented with hypovolemic shock secondary to hematemesis. He was admitted to ICU and intubated for airway protection, seen quickly by gastroneurology who performed upper endoscopy. He was found to have an esophageal ulcer. He had epinephrine injection and Endo Clip placed and appeared to have hemostasis. He has been transfused a total of 2 units PRBC since admission.   Hospital Course:   Assessment/Plan  Hypovolemic shock secondary to acute blood loss anemia secondary to esophageal ulceration - status post clipping and epinephrine injection which achieved hemostasis.  - Has received 2 units of PRBC transfusion since this admission - Transition to PPI therapy by mouth - Hemoglobin stable at 9.1 - Prescription provided for once a day iron supplementation - Patient will follow with GI on outpatient basis in 2-3 months for repeat colonoscopy and endoscopy  Barrett's esophagus - F/u with GI for routine screening  Acute kidney injury  - Prerenal, related to GI losses, acute blood loss anemia - Creatinine now within normal limits  Leukocytosis  - Likely reactive. No evidence of acute infectious process  Hyperglycemia -Hemoglobin A1c 6.4 - Patient is not diabetic - He will follow-up with primary care physician who will repeat A1c. We talked about dietary regimen.   Code Status:Full code Family Communication: patient alone  Consultants: [facility], Dr.  Amedeo Plenty  Produres:  Clip and epi to bleeding esophageal ulcer  Antibiotics:  none    Signed:  [name], MD  [facility] 10/31/2015, 11:50 AM  Pager #: [phone]  Time spent in minutes: more than 30 minutes   Discharge Exam: Filed Vitals:   10/30/15 2220 10/31/15 0528  BP: 136/62 132/74  Pulse: 83 81  Temp: 99 F (37.2 C) 98.6 F (37 C)  Resp: 18 20   Filed Vitals:   10/30/15 1300 10/30/15 1430 10/30/15 2220 10/31/15 0528  BP:  145/71 136/62 132/74  Pulse:  82 83 81  Temp: 98.4 F (36.9 C) 98.7 F (37.1 C) 99 F (37.2 C) 98.6 F (37 C)  TempSrc:  Oral Oral Oral  Resp:  17 18 20   Height:  5\' 10"  (1.778 m)    Weight:      SpO2:  100% 98% 97%    General: Pt is alert, follows commands appropriately, not in acute distress Cardiovascular: Regular rate and rhythm, S1/S2 +, no murmurs Respiratory: Clear to auscultation bilaterally, no wheezing, no crackles, no rhonchi Abdominal: Soft, non tender, non distended, bowel sounds +, no guarding Extremities: no edema, no cyanosis, pulses palpable bilaterally DP and PT Neuro: Grossly nonfocal  Discharge Instructions  Discharge Instructions    Call MD for:  hives    Complete by:  As directed      Call MD for:  persistant dizziness or light-headedness    Complete by:  As directed      Call MD for:  persistant nausea and vomiting    Complete by:  As directed      Call MD for:  severe uncontrolled pain  Complete by:  As directed      Diet - low sodium heart healthy    Complete by:  As directed      Increase activity slowly    Complete by:  As directed             Medication List    STOP taking these medications        SYSTANE 0.4-0.3 % Soln  Generic drug:  Polyethyl Glycol-Propyl Glycol      TAKE these medications        b complex vitamins tablet  Take 1 tablet by mouth daily.     ferrous sulfate 325 (65 FE) MG tablet  Commonly known as:  FERROUSUL  Take 1 tablet (325 mg total) by  mouth daily with breakfast.     hydrochlorothiazide 12.5 MG capsule  Commonly known as:  MICROZIDE  TAKE 1 CAPSULE EVERY DAY     losartan 100 MG tablet  Commonly known as:  COZAAR  TAKE 1 TABLET EVERY DAY     OMEGA 3 PO  Take 1 tablet by mouth daily.     pantoprazole 40 MG tablet  Commonly known as:  PROTONIX  Take 1 tablet (40 mg total) by mouth 2 (two) times daily.     PROAIR RESPICLICK 123XX123 (90 Base) MCG/ACT Aepb  Generic drug:  Albuterol Sulfate  Inhale 2 puffs into the lungs every 6 (six) hours as needed (for shortness of breath).            Follow-up Information    Follow up with HAYES,JOHN C, MD In 4 weeks.   Specialty:  Gastroenterology   Contact information:   [id number] [place] [address] [phone]       Follow up with [name], MD. Schedule an appointment as soon as possible for a visit in 2 weeks.   Specialty:  Internal Medicine   Why:  Follow up appt after recent hospitalization   Contact information:   [address] [address] [phone]        The results of significant diagnostics from this hospitalization (including imaging, microbiology, ancillary and laboratory) are listed below for reference.    Significant Diagnostic Studies: Dg Chest 1 View  10/28/2015  CLINICAL DATA:  Status post intubation. History of hypertension and prostate cancer. EXAM: CHEST 1 VIEW COMPARISON:  Earlier the same date.  04/23/2006. FINDINGS: 1849 hours. Interval intubation. The endotracheal tube is in the midtrachea. There are lower lung volumes with increased left-greater-than-right basilar airspace opacities. The heart size and mediastinal contours are stable. There is no pleural effusion or pneumothorax. IMPRESSION: Satisfactorily positioned endotracheal tube. Left-greater-than-right basilar airspace opacities suspicious for aspiration. Electronically Signed   By: [name] M.D.   On: 10/28/2015 19:31   Dg Chest Port 1  View  10/30/2015  CLINICAL DATA:  GI bleed in weakness. EXAM: PORTABLE CHEST 1 VIEW COMPARISON:  10/28/2015 FINDINGS: Anterior cervical plate and screw fixator.  Interval extubation. Linear opacities at both lung bases favoring subsegmental atelectasis. Thoracic spondylosis. The patient is rotated to the bright on today's radiograph, reducing diagnostic sensitivity and specificity. Heart size within normal limits. IMPRESSION: 1. Bibasilar subsegmental atelectasis. Much of the opacity at the left lung base has improved. 2. Thoracic spondylosis. Electronically Signed   By: Van Clines M.D.   On: 10/30/2015 07:19   Dg Chest Port 1 View  10/28/2015  CLINICAL DATA:  Hematemesis today with generalized weakness. History of hypertension and  prostate cancer. Nonsmoker. EXAM: PORTABLE CHEST 1 VIEW COMPARISON:  Chest radiographs 04/23/2006. FINDINGS: 1627 hours. The heart size and mediastinal contours are normal. The lungs are clear. There is no pleural effusion or pneumothorax. No acute osseous findings are identified. There are postsurgical changes status post lower cervical fusion. Telemetry leads overlie the chest. IMPRESSION: No active cardiopulmonary process. Electronically Signed   By: [name] M.D.   On: 10/28/2015 17:07    Microbiology: Recent Results (from the past 240 hour(s))  MRSA PCR Screening     Status: None   Collection Time: 10/28/15 11:00 PM  Result Value Ref Range Status   MRSA by PCR NEGATIVE NEGATIVE Final    Comment:        The GeneXpert MRSA Assay (FDA approved for NASAL specimens only), is one component of a comprehensive MRSA colonization surveillance program. It is not intended to diagnose MRSA infection nor to guide or monitor treatment for MRSA infections.      Labs: Basic Metabolic Panel:  Recent Labs Lab 10/28/15 1620 10/28/15 1643 10/28/15 2154 10/29/15 0357 10/30/15 0326  NA 139 138 140 142 143  K 5.3* 5.4* 5.1 4.1 3.8  CL 104 103 115* 112*  113*  CO2 19*  --  20* 22 23  GLUCOSE 274* 261* 188* 162* 133*  BUN 46* 68* 44* 38* 17  CREATININE 1.52* 1.20 1.02 1.00 0.73  CALCIUM 8.7*  --  7.4* 7.4* 7.8*  MG  --   --   --  1.5* 1.9  PHOS  --   --   --  2.6  --    Liver Function Tests:  Recent Labs Lab 10/28/15 1620  AST 51*  ALT 43  ALKPHOS 49  BILITOT 1.6*  PROT 6.0*  ALBUMIN 3.3*    Recent Labs Lab 10/28/15 1620  LIPASE 23   No results for input(s): AMMONIA in the last 168 hours. CBC:  Recent Labs Lab 10/28/15 1620  10/28/15 2154 10/29/15 0357 10/29/15 1014 10/29/15 1648 10/30/15 0326 10/30/15 1445 10/31/15 0449  WBC 18.0*  --  13.8* 10.9*  --   --  6.3  --  5.9  NEUTROABS 14.1*  --  11.4*  --   --   --   --   --   --   HGB 12.3*  < > 12.4* 11.1* 10.8* 10.9* 9.1* 9.8* 9.1*  HCT 37.8*  < > 38.1* 34.2* 32.8* 32.5* 27.7* 28.8* 28.2*  MCV 86.9  --  88.6 88.6  --   --  89.9  --  89.2  PLT 389  --  273 262  --   --  190  --  185  < > = values in this interval not displayed. Cardiac Enzymes:  Recent Labs Lab 10/28/15 1620  TROPONINI <0.03   BNP: BNP (last 3 results) No results for input(s): BNP in the last 8760 hours.  ProBNP (last 3 results) No results for input(s): PROBNP in the last 8760 hours.  CBG:  Recent Labs Lab 10/30/15 0746 10/30/15 1149 10/30/15 1804 10/30/15 2227 10/31/15 0743  GLUCAP 127* 120* 141* 114* 145*

## 2015-10-31 NOTE — Progress Notes (Signed)
Pt given discharge, follow up and medication instructions, verbalized understanding, 2 prescriptions given, IV and telemetry box removed, family to transport home.

## 2015-11-13 ENCOUNTER — Telehealth: Payer: Self-pay

## 2015-11-13 NOTE — Telephone Encounter (Signed)
LVM for pt to call back as soon as possible.   RE: Flu Vaccine 

## 2015-11-14 ENCOUNTER — Ambulatory Visit (INDEPENDENT_AMBULATORY_CARE_PROVIDER_SITE_OTHER): Payer: BC Managed Care – PPO | Admitting: Family

## 2015-11-14 ENCOUNTER — Encounter: Payer: Self-pay | Admitting: Family

## 2015-11-14 ENCOUNTER — Other Ambulatory Visit (INDEPENDENT_AMBULATORY_CARE_PROVIDER_SITE_OTHER): Payer: BC Managed Care – PPO

## 2015-11-14 VITALS — BP 120/74 | HR 80 | Temp 98.6°F | Resp 16 | Ht 69.0 in | Wt 229.8 lb

## 2015-11-14 DIAGNOSIS — J019 Acute sinusitis, unspecified: Secondary | ICD-10-CM

## 2015-11-14 DIAGNOSIS — K922 Gastrointestinal hemorrhage, unspecified: Secondary | ICD-10-CM | POA: Diagnosis not present

## 2015-11-14 LAB — CBC
HCT: 35.8 % — ABNORMAL LOW (ref 39.0–52.0)
Hemoglobin: 11.9 g/dL — ABNORMAL LOW (ref 13.0–17.0)
MCHC: 33.3 g/dL (ref 30.0–36.0)
MCV: 84.1 fl (ref 78.0–100.0)
Platelets: 408 10*3/uL — ABNORMAL HIGH (ref 150.0–400.0)
RBC: 4.26 Mil/uL (ref 4.22–5.81)
RDW: 14.7 % (ref 11.5–15.5)
WBC: 9.6 10*3/uL (ref 4.0–10.5)

## 2015-11-14 MED ORDER — PANTOPRAZOLE SODIUM 40 MG PO TBEC: 40.0000 mg | DELAYED_RELEASE_TABLET | Freq: Two times a day (BID) | ORAL | Status: DC

## 2015-11-14 MED ORDER — AMOXICILLIN-POT CLAVULANATE 875-125 MG PO TABS: 1.0000 | ORAL_TABLET | Freq: Two times a day (BID) | ORAL | Status: DC

## 2015-11-14 NOTE — Progress Notes (Signed)
Pre visit review using our clinic review tool, if applicable. No additional management support is needed unless otherwise documented below in the visit note. 

## 2015-11-14 NOTE — Progress Notes (Signed)
Subjective:    Patient ID: Johnny [name], male    DOB: 05-08-1951, 65 y.o.   MRN: [id number]  Chief Complaint  Patient presents with  . Hospitalization Follow-up    pt states he feels better since leaving the hospital, no more GI issues, would like to talk about his congestion he has had for a week, productive cough, sinus congestion    HPI:  Johnny [name] is a 65 y.o. male who  has a past medical history of Hyperlipidemia; Hypertension; and Prostate cancer ([place]) (2008). and presents today for an office follow up.   1.) Hematemesis - Recently evaluated in the emergency room and admitted to the hospital for 2 episodes of gross hematemesis. During the first episode there is associated lightheadedness and near syncope. Also noted to have several loose stools described as black. Wife indicated that he appeared yellow and pale. In the triage area he was noted to vomit red blood with clots with an estimated loss of 2-300 mL. Physical exam showed he is pale and diaphoretic and spider nevi noted to bilateral cheeks. He was found to have an acute upper GI bleed with hypertension and tachycardia. He is given to large pores IVs with normal saline. He was started on Protonix and octreotide. Endoscopy was attempted and discontinued secondary to the amount of blood in his esophagus and he was intubated for airway protection with concern for aspiration. He was noted to have a significant amount of blood or other vocal cords. Following intubation he was successfully able to have the endoscopy and noted to have a large distal esophageal ulcer. Previous ulcer was clipped. This achieved hemostasis and he received 2 units of pack red blood cells during his treatment. Proton pump inhibitor was transitioned to oral. With instructions to follow up with GI in 2-3 months. He is also noted to have Barrett's esophagus. His hemoglobin A1c was noted to be 6.4 and instructed to follow-up with primary care. All hospital records,  labs, procedures were reviewed in detail.  Since leaving the hospital he reports that he feels better and has had no additional episodes of hematemsis or abdominal pain. Reports taking the protonix as prescribed without adverse side effects. Notes that he is able to return to his normal activities of daily living.  2.) Sinus congestion - This is a new problem. Associated symptom of productive cough, congestion, and sinus pressure that has been going on for about 1 week. No fevers.  Denies any modifying factors or treatments in attempt to improve his symptoms.   No Known Allergies   Current Outpatient Prescriptions on File Prior to Visit  Medication Sig Dispense Refill  . Albuterol Sulfate (PROAIR RESPICLICK) 123XX123 (90 Base) MCG/ACT AEPB Inhale 2 puffs into the lungs every 6 (six) hours as needed (for shortness of breath).    Marland Kitchen b complex vitamins tablet Take 1 tablet by mouth daily.    . ferrous sulfate (FERROUSUL) 325 (65 FE) MG tablet Take 1 tablet (325 mg total) by mouth daily with breakfast. 30 tablet 0  . hydrochlorothiazide (MICROZIDE) 12.5 MG capsule TAKE 1 CAPSULE EVERY DAY 90 capsule 3  . losartan (COZAAR) 100 MG tablet TAKE 1 TABLET EVERY DAY 90 tablet 2  . Omega-3 Fatty Acids (OMEGA 3 PO) Take 1 tablet by mouth daily.      No current facility-administered medications on file prior to visit.     Past Surgical History  Procedure Laterality Date  . Colonoscopy  2006  negative; [place], [place]  . Prostatectomy  11/2007    Dr.Moul, [place]  . Wrist surgery  1998    [place] bone resection 2006  . Cervical fusion  2007    C5-C7 , Dr Saintclair Halsted   . Esophagogastroduodenoscopy Left 10/28/2015    Procedure: ESOPHAGOGASTRODUODENOSCOPY (EGD);  Surgeon: Teena Irani, MD;  Location: [facility];  Service: Endoscopy;  Laterality: Left;    Past Medical History  Diagnosis Date  . Hyperlipidemia     LDL goal = < 110  . Hypertension   . Prostate cancer [facility]) 2008    Dr Darcus Austin, [facility]    Review of  Systems  Constitutional: Negative for fever and chills.  HENT: Positive for congestion, sinus pressure and sore throat.   Respiratory: Negative for chest tightness and shortness of breath.   Gastrointestinal: Negative for nausea, vomiting, abdominal pain, diarrhea, constipation, blood in stool and abdominal distention.      Objective:    BP 120/74 mmHg  Pulse 80  Temp(Src) 98.6 F (37 C) (Oral)  Resp 16  Ht 5\' 9"  (1.753 m)  Wt 229 lb 12.8 oz (104.237 kg)  BMI 33.92 kg/m2  SpO2 94% Nursing note and vital signs reviewed.  Physical Exam  Constitutional: He is oriented to person, place, and time. He appears well-developed and well-nourished. No distress.  HENT:  Right Ear: Hearing, tympanic membrane, external ear and ear canal normal.  Left Ear: Hearing, tympanic membrane, external ear and ear canal normal.  Nose: Nose normal. Right sinus exhibits no maxillary sinus tenderness and no frontal sinus tenderness. Left sinus exhibits no maxillary sinus tenderness and no frontal sinus tenderness.  Mouth/Throat: Uvula is midline, oropharynx is clear and moist and mucous membranes are normal.  Cardiovascular: Normal rate, regular rhythm, normal heart sounds and intact distal pulses.   Pulmonary/Chest: Effort normal and breath sounds normal.  Neurological: He is alert and oriented to person, place, and time.  Skin: Skin is warm and dry.  Psychiatric: He has a normal mood and affect. His behavior is normal. Judgment and thought content normal.       Assessment & Plan:   Problem List Items Addressed This Visit      Respiratory   Sinusitis, acute    Symptoms and exam consistent with sinusitis. Given recent improvements may potentially be viral. Written prescription with instructions for watchful waiting provided to patient. Start Augmentin as needed. Continue over-the-counter medications as needed for symptom relief and supportive care. Follow-up if symptoms worsen or fail to improve.        Relevant Medications   amoxicillin-clavulanate (AUGMENTIN) 875-125 MG tablet     Digestive   Upper GI bleeding - Primary    Symptoms of upper GI bleeding have resolved with no residual GI symptoms. Obtain CBC to check hemoglobin. Continue current dosage of Protonix pending GI follow up.       Relevant Medications   pantoprazole (PROTONIX) 40 MG tablet   Other Relevant Orders   CBC

## 2015-11-14 NOTE — Patient Instructions (Addendum)
Thank you for choosing [facility].  Summary/Instructions:  Your prescription(s) have been submitted to your pharmacy or been printed and provided for you. Please take as directed and contact our office if you believe you are having problem(s) with the medication(s) or have any questions.   Please stop by the lab on the basement level of the building for your blood work. Your results will be released to [facility] [place] (or called to you) after review, usually within 72 hours after test completion. If any changes need to be made, you will be notified at that same time.  If your symptoms worsen or fail to improve, please contact our office for further instruction, or in case of emergency go directly to the emergency room at the closest medical facility.    General Recommendations:    Please drink plenty of fluids.  Get plenty of rest   Sleep in humidified air  Use saline nasal sprays  Netti pot   OTC Medications:  Decongestants - helps relieve congestion   Flonase (generic fluticasone) or Nasacort (generic triamcinolone) - please make sure to use the "cross-over" technique at a 45 degree angle towards the opposite eye as opposed to straight up the nasal passageway.   Sudafed (generic pseudoephedrine - Note this is the one that is available behind the pharmacy counter); Products with phenylephrine (-PE) may also be used but is often not as effective as pseudoephedrine.   If you have HIGH BLOOD PRESSURE - Coricidin HBP; AVOID any product that is -D as this contains pseudoephedrine which may increase your blood pressure.  Afrin (oxymetazoline) every 6-8 hours for up to 3 days.   Allergies - helps relieve runny nose, itchy eyes and sneezing   Claritin (generic loratidine), Allegra (fexofenidine), or Zyrtec (generic cyrterizine) for runny nose. These medications should not cause drowsiness.  Note - Benadryl (generic diphenhydramine) may be used however may cause drowsiness  Cough  -   Delsym or Robitussin (generic dextromethorphan)  Expectorants - helps loosen mucus to ease removal   Mucinex (generic guaifenesin) as directed on the package.  Headaches / General Aches   Tylenol (generic acetaminophen) - DO NOT EXCEED 3 grams (3,000 mg) in a 24 hour time period  Advil/Motrin (generic ibuprofen)   Sore Throat -   Salt water gargle   Chloraseptic (generic benzocaine) spray or lozenges / Sucrets (generic dyclonine)    Sinusitis Sinusitis is redness, soreness, and inflammation of the paranasal sinuses. Paranasal sinuses are air pockets within the bones of your face (beneath the eyes, the middle of the forehead, or above the eyes). In healthy paranasal sinuses, mucus is able to drain out, and air is able to circulate through them by way of your nose. However, when your paranasal sinuses are inflamed, mucus and air can become trapped. This can allow bacteria and other germs to grow and cause infection. Sinusitis can develop quickly and last only a short time (acute) or continue over a long period (chronic). Sinusitis that lasts for more than 12 weeks is considered chronic.  CAUSES  Causes of sinusitis include:  Allergies.  Structural abnormalities, such as displacement of the cartilage that separates your nostrils (deviated septum), which can decrease the air flow through your nose and sinuses and affect sinus drainage.  Functional abnormalities, such as when the small hairs (cilia) that line your sinuses and help remove mucus do not work properly or are not present. SIGNS AND SYMPTOMS  Symptoms of acute and chronic sinusitis are the same. The primary symptoms  are pain and pressure around the affected sinuses. Other symptoms include:  Upper toothache.  Earache.  Headache.  Bad breath.  Decreased sense of smell and taste.  A cough, which worsens when you are lying flat.  Fatigue.  Fever.  Thick drainage from your nose, which often is green and may  contain pus (purulent).  Swelling and warmth over the affected sinuses. DIAGNOSIS  Your health care provider will perform a physical exam. During the exam, your health care provider may:  Look in your nose for signs of abnormal growths in your nostrils (nasal polyps).  Tap over the affected sinus to check for signs of infection.  View the inside of your sinuses (endoscopy) using an imaging device that has a light attached (endoscope). If your health care provider suspects that you have chronic sinusitis, one or more of the following tests may be recommended:  Allergy tests.  Nasal culture. A sample of mucus is taken from your nose, sent to a lab, and screened for bacteria.  Nasal cytology. A sample of mucus is taken from your nose and examined by your health care provider to determine if your sinusitis is related to an allergy. TREATMENT  Most cases of acute sinusitis are related to a viral infection and will resolve on their own within 10 days. Sometimes medicines are prescribed to help relieve symptoms (pain medicine, decongestants, nasal steroid sprays, or saline sprays).  However, for sinusitis related to a bacterial infection, your health care provider will prescribe antibiotic medicines. These are medicines that will help kill the bacteria causing the infection.  Rarely, sinusitis is caused by a fungal infection. In theses cases, your health care provider will prescribe antifungal medicine. For some cases of chronic sinusitis, surgery is needed. Generally, these are cases in which sinusitis recurs more than 3 times per year, despite other treatments. HOME CARE INSTRUCTIONS   Drink plenty of water. Water helps thin the mucus so your sinuses can drain more easily.  Use a humidifier.  Inhale steam 3 to 4 times a day (for example, sit in the bathroom with the shower running).  Apply a warm, moist washcloth to your face 3 to 4 times a day, or as directed by your health care  provider.  Use saline nasal sprays to help moisten and clean your sinuses.  Take medicines only as directed by your health care provider.  If you were prescribed either an antibiotic or antifungal medicine, finish it all even if you start to feel better. SEEK IMMEDIATE MEDICAL CARE IF:  You have increasing pain or severe headaches.  You have nausea, vomiting, or drowsiness.  You have swelling around your face.  You have vision problems.  You have a stiff neck.  You have difficulty breathing. MAKE SURE YOU:   Understand these instructions.  Will watch your condition.  Will get help right away if you are not doing well or get worse. Document Released: 09/01/2005 Document Revised: 01/16/2014 Document Reviewed: 09/16/2011 [facility] Patient Information 2015 [place], [place]. This information is not intended to replace advice given to you by your health care provider. Make sure you discuss any questions you have with your health care provider.

## 2015-11-14 NOTE — Assessment & Plan Note (Signed)
Symptoms and exam consistent with sinusitis. Given recent improvements may potentially be viral. Written prescription with instructions for watchful waiting provided to patient. Start Augmentin as needed. Continue over-the-counter medications as needed for symptom relief and supportive care. Follow-up if symptoms worsen or fail to improve.

## 2015-11-14 NOTE — Assessment & Plan Note (Signed)
Symptoms of upper GI bleeding have resolved with no residual GI symptoms. Obtain CBC to check hemoglobin. Continue current dosage of Protonix pending GI follow up.

## 2016-02-15 ENCOUNTER — Encounter: Payer: Self-pay | Admitting: Family

## 2016-03-13 ENCOUNTER — Telehealth: Payer: Self-pay | Admitting: Internal Medicine

## 2016-03-13 DIAGNOSIS — I10 Essential (primary) hypertension: Secondary | ICD-10-CM

## 2016-03-13 DIAGNOSIS — K922 Gastrointestinal hemorrhage, unspecified: Secondary | ICD-10-CM

## 2016-03-13 MED ORDER — LOSARTAN POTASSIUM 100 MG PO TABS: ORAL_TABLET | ORAL | Status: DC

## 2016-03-13 NOTE — Telephone Encounter (Signed)
Patient called and wants a refill of losartan (COZAAR) 100 MG tablet [id number] . Has a transfer appointment made. Pharmacy is [facility] in [place] [address].

## 2016-03-14 MED ORDER — PANTOPRAZOLE SODIUM 40 MG PO TBEC: 40.0000 mg | DELAYED_RELEASE_TABLET | Freq: Two times a day (BID) | ORAL | Status: DC

## 2016-03-14 NOTE — Addendum Note (Signed)
Addended by: Earnstine Regal on: 03/14/2016 10:59 AM   Modules accepted: Orders

## 2016-04-03 DIAGNOSIS — N5231 Erectile dysfunction following radical prostatectomy: Secondary | ICD-10-CM | POA: Diagnosis not present

## 2016-04-03 DIAGNOSIS — C61 Malignant neoplasm of prostate: Secondary | ICD-10-CM | POA: Diagnosis not present

## 2016-04-16 ENCOUNTER — Encounter: Payer: BC Managed Care – PPO | Admitting: Internal Medicine

## 2016-05-03 ENCOUNTER — Other Ambulatory Visit: Payer: Self-pay | Admitting: Internal Medicine

## 2016-05-03 DIAGNOSIS — I10 Essential (primary) hypertension: Secondary | ICD-10-CM

## 2016-06-06 ENCOUNTER — Other Ambulatory Visit: Payer: Self-pay | Admitting: Internal Medicine

## 2016-06-06 DIAGNOSIS — I10 Essential (primary) hypertension: Secondary | ICD-10-CM

## 2016-06-19 DIAGNOSIS — R7303 Prediabetes: Secondary | ICD-10-CM | POA: Insufficient documentation

## 2016-06-19 NOTE — Progress Notes (Signed)
Subjective:    Patient ID: Johnny [name], male    DOB: [birth year], 65 y.o.   MRN: [id number]  HPI He is here to establish with a new pcp.     Here for welcome to medicare wellness exam and an annual physical exam.   I have personally reviewed and have noted 1.The patient's medical and social history 2.Their use of alcohol, tobacco or illicit drugs 3.Their current medications and supplements 4.The patient's functional ability including ADL's, fall risks, home safety risks and hearing or visual impairment. 5.Diet and physical activities 6.Evidence for depression or mood disorders 7.Care team reviewed - urology at [facility] every 6 months   Are there smokers in your home (other than you)? No  Risk Factors Exercise: goes to gym regularly - 4-5 times a week Dietary issues discussed: well balanced  Cardiac risk factors: advanced age, hypertension, obesity (BMI >= 30 kg/m2).  Depression Screen  Have you felt down, depressed or hopeless? No  Have you felt little interest or pleasure in doing things?  No  Activities of Daily Living In your present state of health, do you have any difficulty performing the following activities?:  Driving? No Managing money?  No Feeding yourself? No Getting from bed to chair? No Climbing a flight of stairs? No Preparing food and eating?: No Bathing or showering? No Getting dressed: No Getting to/using the toilet? No Moving around from place to place: No In the past year have you fallen or had a near fall?: No   Are you sexually active?  No  Do you have more than one partner?  N/A  Hearing Difficulties: No Do you often ask people to speak up or repeat themselves? No Do you experience ringing or noises in your ears? No Do you have difficulty understanding soft or whispered voices? No Vision:              Any change in vision:  No             Up to date with eye exam:   yes Memory:  Do you feel that you have a problem with memory? No  Do you often misplace items? No  Do you feel safe at home?  Yes  Cognitive Testing  Alert, Orientated? Yes  Normal Appearance? Yes  Recall of three objects?  Yes  Can perform simple calculations? Yes  Displays appropriate judgment? Yes  Can read the correct time from a watch face? Yes   Advanced Directives have been discussed with the patient? Yes     Medications and allergies reviewed with patient and updated if appropriate.  Patient Active Problem List   Diagnosis Date Noted  . Prediabetes 06/19/2016  . Upper GI bleeding 10/28/2015  . Rosacea 04/16/2015  . HYPERLIPIDEMIA 01/09/2009  . PROSTATE CANCER, HX OF 01/09/2009  . Essential hypertension 08/02/2007  . ABNORMAL ELECTROCARDIOGRAM 08/02/2007    Current Outpatient Prescriptions on File Prior to Visit  Medication Sig Dispense Refill  . b complex vitamins tablet Take 1 tablet by mouth daily.    . hydrochlorothiazide (MICROZIDE) 12.5 MG capsule TAKE 1 CAPSULE EVERY DAY 90 capsule 0  . losartan (COZAAR) 100 MG tablet TAKE 1 TABLET EVERY DAY 90 tablet 0  . Omega-3 Fatty Acids (OMEGA 3 PO) Take 1 tablet by mouth daily.     . pantoprazole (PROTONIX) 40 MG tablet Take 1 tablet (40 mg total) by mouth 2 (two) times daily. Keep Oct appt w/new PCP for refills 180 tablet 0  No current facility-administered medications on file prior to visit.     Past Medical History:  Diagnosis Date  . Hyperlipidemia    LDL goal = < 110  . Hypertension   . Prostate cancer [facility]) 2008   Dr Darcus Austin, [facility]    Past Surgical History:  Procedure Laterality Date  . CERVICAL FUSION  2007   C5-C7 , Dr Saintclair Halsted   . COLONOSCOPY  2006   negative; [place],   . ESOPHAGOGASTRODUODENOSCOPY Left 10/28/2015   Procedure: ESOPHAGOGASTRODUODENOSCOPY (EGD);  Surgeon: Teena Irani, MD;  Location: [facility];  Service: Endoscopy;  Laterality: Left;  . PROSTATECTOMY  11/2007   Dr.Moul, [facility]  .  WRIST SURGERY  1998   Hammate bone resection 2006    Social History   Social History  . Marital status: Married    Spouse name: N/A  . Number of children: 3  . Years of education: N/A   Occupational History  . Teacher/Coach    Social History Main Topics  . Smoking status: Never Smoker  . Smokeless tobacco: Not on file  . Alcohol use No  . Drug use: No  . Sexual activity: Not on file   Other Topics Concern  . Not on file   Social History Narrative  . No narrative on file    Family History  Problem Relation Age of Onset  . Breast cancer Mother   . Colon cancer Paternal Grandmother   . Diabetes Paternal Grandfather   . Heart attack Paternal Grandfather 53  . Breast cancer Maternal Grandmother   . Heart attack Maternal Grandfather 72  . Stroke Neg Hx   . Alzheimer's disease Mother   . Heart disease Father     Atrial fibrillation    Review of Systems  Constitutional: Negative for appetite change, chills, fatigue, fever and unexpected weight change.  HENT: Negative for hearing loss and tinnitus.   Eyes: Negative for visual disturbance.  Respiratory: Negative for cough, shortness of breath and wheezing.   Cardiovascular: Positive for palpitations (rare). Negative for chest pain and leg swelling.  Gastrointestinal: Negative for abdominal pain, blood in stool, constipation, diarrhea and nausea.       No gerd - controlled  Genitourinary: Negative for dysuria and hematuria.  Musculoskeletal: Negative for arthralgias and back pain.  Skin: Negative for color change and rash.  Neurological: Negative for dizziness, light-headedness, numbness and headaches.  Psychiatric/Behavioral: Negative for dysphoric mood. The patient is not nervous/anxious.        Objective:   Vitals:   06/20/16 0802  BP: 140/84  Pulse: 67  Resp: 16  Temp: 98.4 F (36.9 C)   Filed Weights   06/20/16 0802  Weight: 234 lb (106.1 kg)   Body mass index is 33.58 kg/m.   Physical  Exam Constitutional: He appears well-developed and well-nourished. No distress.  HENT:  Head: Normocephalic and atraumatic.  Right Ear: External ear normal.  Left Ear: External ear normal.  Mouth/Throat: Oropharynx is clear and moist.  Normal ear canals and TM b/l  Eyes: Conjunctivae and EOM are normal.  Neck: Neck supple. No tracheal deviation present. No thyromegaly present.  No carotid bruit  Cardiovascular: Normal rate, regular rhythm, normal heart sounds and intact distal pulses.   No murmur heard.  He exhibits no edema.  mild varicosities b/l legs Pulmonary/Chest: Effort normal and breath sounds normal. No respiratory distress. He has no wheezes. He has no rales.  Abdominal: Soft. Bowel sounds are normal. He exhibits no distension. There is no  tenderness.  Genitourinary: deferred  to urology Lymphadenopathy:   He has no cervical adenopathy.  Skin: Skin is warm and dry. He is not diaphoretic.  Psychiatric: He has a normal mood and affect. His behavior is normal.       Assessment & Plan:   Wellness Exam: Immunizations  Flu vaccine today, discussed shingles and prevnar vaccines Colonoscopy  Up to date  Eye exam  Up to date  Hearing loss  none Memory concerns/difficulties   none Independent of ADLs  fully Stressed the importance of regular exercise   Patient received copy of preventative screening tests/immunizations recommended for the next 5-10 years.  Physical exam: Screening blood work Immunizations   Flu vaccine today, discussed shingles and prevnar vaccines Colonoscopy  Up to date  Eye exams  Up to date  EKG done    10/2015 Exercise  regular Weight - obese -- advised to work on weight loss Skin  - no concerns Substance abuse  -  none  See Problem List for Assessment and Plan of chronic medical problems.

## 2016-06-19 NOTE — Patient Instructions (Addendum)
Johnny [name] , Thank you for taking time to come for your Medicare Wellness Visit. I appreciate your ongoing commitment to your health goals. Please review the following plan we discussed and let me know if I can assist you in the future.   These are the goals we discussed: Goals    Work on weight loss      This is a list of the screening recommended for you and due dates:  Health Maintenance  Topic Date Due  .  Hepatitis C: One time screening is recommended by Center for Disease Control  (CDC) for  adults born from [birth year] through 1965.   Ordered today  . Shingles Vaccine  03/29/2011  . Pneumonia vaccines (1 of 2 - PCV13) 03/28/2016  . Tetanus Vaccine  02/02/2022  . Colon Cancer Screening  01/10/2026  . Flu Shot  Completed  . HIV Screening  Completed     Test(s) ordered today. Your results will be released to [place] (or called to you) after review, usually within 72hours after test completion. If any changes need to be made, you will be notified at that same time.  All other Health Maintenance issues reviewed.   All recommended immunizations and age-appropriate screenings are up-to-date or discussed.  Flu vaccine administered today.   Medications reviewed and updated.  No changes recommended at this time.  Your prescription(s) have been submitted to your pharmacy. Please take as directed and contact our office if you believe you are having problem(s) with the medication(s).   Please followup in one year   Health Maintenance, Male A healthy lifestyle and preventative care can promote health and wellness.  Maintain regular health, dental, and eye exams.  Eat a healthy diet. Foods like vegetables, fruits, whole grains, low-fat dairy products, and lean protein foods contain the nutrients you need and are low in calories. Decrease your intake of foods high in solid fats, added sugars, and salt. Get information about a proper diet from your health care provider, if  necessary.  Regular physical exercise is one of the most important things you can do for your health. Most adults should get at least 150 minutes of moderate-intensity exercise (any activity that increases your heart rate and causes you to sweat) each week. In addition, most adults need muscle-strengthening exercises on 2 or more days a week.   Maintain a healthy weight. The body mass index (BMI) is a screening tool to identify possible weight problems. It provides an estimate of body fat based on height and weight. Your health care provider can find your BMI and can help you achieve or maintain a healthy weight. For males 20 years and older:  A BMI below 18.5 is considered underweight.  A BMI of 18.5 to 24.9 is normal.  A BMI of 25 to 29.9 is considered overweight.  A BMI of 30 and above is considered obese.  Maintain normal blood lipids and cholesterol by exercising and minimizing your intake of saturated fat. Eat a balanced diet with plenty of fruits and vegetables. Blood tests for lipids and cholesterol should begin at age 35 and be repeated every 5 years. If your lipid or cholesterol levels are high, you are over age 80, or you are at high risk for heart disease, you may need your cholesterol levels checked more frequently.Ongoing high lipid and cholesterol levels should be treated with medicines if diet and exercise are not working.  If you smoke, find out from your health care provider how to quit. If  you do not use tobacco, do not start.  Lung cancer screening is recommended for adults aged 38-80 years who are at high risk for developing lung cancer because of a history of smoking. A yearly low-dose CT scan of the lungs is recommended for people who have at least a 30-pack-year history of smoking and are current smokers or have quit within the past 15 years. A pack year of smoking is smoking an average of 1 pack of cigarettes a day for 1 year (for example, a 30-pack-year history of  smoking could mean smoking 1 pack a day for 30 years or 2 packs a day for 15 years). Yearly screening should continue until the smoker has stopped smoking for at least 15 years. Yearly screening should be stopped for people who develop a health problem that would prevent them from having lung cancer treatment.  If you choose to drink alcohol, do not have more than 2 drinks per day. One drink is considered to be 12 oz (360 mL) of beer, 5 oz (150 mL) of wine, or 1.5 oz (45 mL) of liquor.  Avoid the use of street drugs. Do not share needles with anyone. Ask for help if you need support or instructions about stopping the use of drugs.  High blood pressure causes heart disease and increases the risk of stroke. High blood pressure is more likely to develop in:  People who have blood pressure in the end of the normal range (100-139/85-89 mm Hg).  People who are overweight or obese.  People who are African American.  If you are 46-42 years of age, have your blood pressure checked every 3-5 years. If you are 42 years of age or older, have your blood pressure checked every year. You should have your blood pressure measured twice--once when you are at a hospital or clinic, and once when you are not at a hospital or clinic. Record the average of the two measurements. To check your blood pressure when you are not at a hospital or clinic, you can use:  An automated blood pressure machine at a pharmacy.  A home blood pressure monitor.  If you are 66-8 years old, ask your health care provider if you should take aspirin to prevent heart disease.  Diabetes screening involves taking a blood sample to check your fasting blood sugar level. This should be done once every 3 years after age 28 if you are at a normal weight and without risk factors for diabetes. Testing should be considered at a younger age or be carried out more frequently if you are overweight and have at least 1 risk factor for  diabetes.  Colorectal cancer can be detected and often prevented. Most routine colorectal cancer screening begins at the age of 57 and continues through age 79. However, your health care provider may recommend screening at an earlier age if you have risk factors for colon cancer. On a yearly basis, your health care provider may provide home test kits to check for hidden blood in the stool. A small camera at the end of a tube may be used to directly examine the colon (sigmoidoscopy or colonoscopy) to detect the earliest forms of colorectal cancer. Talk to your health care provider about this at age 25 when routine screening begins. A direct exam of the colon should be repeated every 5-10 years through age 33, unless early forms of precancerous polyps or small growths are found.  People who are at an increased risk for hepatitis B  should be screened for this virus. You are considered at high risk for hepatitis B if:  You were born in a country where hepatitis B occurs often. Talk with your health care provider about which countries are considered high risk.  Your parents were born in a high-risk country and you have not received a shot to protect against hepatitis B (hepatitis B vaccine).  You have HIV or AIDS.  You use needles to inject street drugs.  You live with, or have sex with, someone who has hepatitis B.  You are a man who has sex with other men (MSM).  You get hemodialysis treatment.  You take certain medicines for conditions like cancer, organ transplantation, and autoimmune conditions.  Hepatitis C blood testing is recommended for all people born from [birth year] through 1965 and any individual with known risk factors for hepatitis C.  Healthy men should no longer receive prostate-specific antigen (PSA) blood tests as part of routine cancer screening. Talk to your health care provider about prostate cancer screening.  Testicular cancer screening is not recommended for adolescents or  adult males who have no symptoms. Screening includes self-exam, a health care provider exam, and other screening tests. Consult with your health care provider about any symptoms you have or any concerns you have about testicular cancer.  Practice safe sex. Use condoms and avoid high-risk sexual practices to reduce the spread of sexually transmitted infections (STIs).  You should be screened for STIs, including gonorrhea and chlamydia if:  You are sexually active and are younger than 24 years.  You are older than 24 years, and your health care provider tells you that you are at risk for this type of infection.  Your sexual activity has changed since you were last screened, and you are at an increased risk for chlamydia or gonorrhea. Ask your health care provider if you are at risk.  If you are at risk of being infected with HIV, it is recommended that you take a prescription medicine daily to prevent HIV infection. This is called pre-exposure prophylaxis (PrEP). You are considered at risk if:  You are a man who has sex with other men (MSM).  You are a heterosexual man who is sexually active with multiple partners.  You take drugs by injection.  You are sexually active with a partner who has HIV.  Talk with your health care provider about whether you are at high risk of being infected with HIV. If you choose to begin PrEP, you should first be tested for HIV. You should then be tested every 3 months for as long as you are taking PrEP.  Use sunscreen. Apply sunscreen liberally and repeatedly throughout the day. You should seek shade when your shadow is shorter than you. Protect yourself by wearing long sleeves, pants, a wide-brimmed hat, and sunglasses year round whenever you are outdoors.  Tell your health care provider of new moles or changes in moles, especially if there is a change in shape or color. Also, tell your health care provider if a mole is larger than the size of a pencil  eraser.  A one-time screening for abdominal aortic aneurysm (AAA) and surgical repair of large AAAs by ultrasound is recommended for men aged 37-75 years who are current or former smokers.  Stay current with your vaccines (immunizations).   This information is not intended to replace advice given to you by your health care provider. Make sure you discuss any questions you have with your health care provider.  Document Released: 02/28/2008 Document Revised: 09/22/2014 Document Reviewed: 01/27/2011 Elsevier Interactive Patient Education [employer].

## 2016-06-20 ENCOUNTER — Other Ambulatory Visit (INDEPENDENT_AMBULATORY_CARE_PROVIDER_SITE_OTHER): Payer: Medicare Other

## 2016-06-20 ENCOUNTER — Ambulatory Visit (INDEPENDENT_AMBULATORY_CARE_PROVIDER_SITE_OTHER): Payer: Medicare Other | Admitting: Internal Medicine

## 2016-06-20 ENCOUNTER — Encounter: Payer: Self-pay | Admitting: Internal Medicine

## 2016-06-20 VITALS — BP 140/84 | HR 67 | Temp 98.4°F | Resp 16 | Ht 70.0 in | Wt 234.0 lb

## 2016-06-20 DIAGNOSIS — Z8546 Personal history of malignant neoplasm of prostate: Secondary | ICD-10-CM

## 2016-06-20 DIAGNOSIS — E7800 Pure hypercholesterolemia, unspecified: Secondary | ICD-10-CM

## 2016-06-20 DIAGNOSIS — I10 Essential (primary) hypertension: Secondary | ICD-10-CM

## 2016-06-20 DIAGNOSIS — Z0000 Encounter for general adult medical examination without abnormal findings: Secondary | ICD-10-CM

## 2016-06-20 DIAGNOSIS — R7303 Prediabetes: Secondary | ICD-10-CM

## 2016-06-20 DIAGNOSIS — Z23 Encounter for immunization: Secondary | ICD-10-CM | POA: Diagnosis not present

## 2016-06-20 DIAGNOSIS — K922 Gastrointestinal hemorrhage, unspecified: Secondary | ICD-10-CM

## 2016-06-20 LAB — CBC WITH DIFFERENTIAL/PLATELET
BASOS ABS: 0 10*3/uL (ref 0.0–0.1)
Basophils Relative: 0.4 % (ref 0.0–3.0)
EOS ABS: 0.2 10*3/uL (ref 0.0–0.7)
Eosinophils Relative: 2.9 % (ref 0.0–5.0)
HCT: 45 % (ref 39.0–52.0)
Hemoglobin: 15.2 g/dL (ref 13.0–17.0)
LYMPHS ABS: 1.7 10*3/uL (ref 0.7–4.0)
Lymphocytes Relative: 24.3 % (ref 12.0–46.0)
MCHC: 33.8 g/dL (ref 30.0–36.0)
MCV: 78.7 fl (ref 78.0–100.0)
MONOS PCT: 9.9 % (ref 3.0–12.0)
Monocytes Absolute: 0.7 10*3/uL (ref 0.1–1.0)
NEUTROS PCT: 62.5 % (ref 43.0–77.0)
Neutro Abs: 4.3 10*3/uL (ref 1.4–7.7)
PLATELETS: 306 10*3/uL (ref 150.0–400.0)
RBC: 5.71 Mil/uL (ref 4.22–5.81)
RDW: 15 % (ref 11.5–15.5)
WBC: 6.9 10*3/uL (ref 4.0–10.5)

## 2016-06-20 LAB — LIPID PANEL
CHOL/HDL RATIO: 4
Cholesterol: 161 mg/dL (ref 0–200)
HDL: 37.2 mg/dL — ABNORMAL LOW (ref >39.00)
LDL CALC: 88 mg/dL (ref 0–99)
NONHDL: 123.79
Triglycerides: 180 mg/dL — ABNORMAL HIGH (ref 0.0–149.0)
VLDL: 36 mg/dL (ref 0.0–40.0)

## 2016-06-20 LAB — COMPREHENSIVE METABOLIC PANEL
ALK PHOS: 78 U/L (ref 39–117)
ALT: 66 U/L — AB (ref 0–53)
AST: 43 U/L — AB (ref 0–37)
Albumin: 4 g/dL (ref 3.5–5.2)
BILIRUBIN TOTAL: 0.8 mg/dL (ref 0.2–1.2)
BUN: 16 mg/dL (ref 6–23)
CO2: 29 meq/L (ref 19–32)
CREATININE: 1.04 mg/dL (ref 0.40–1.50)
Calcium: 9.6 mg/dL (ref 8.4–10.5)
Chloride: 102 mEq/L (ref 96–112)
GFR: 76.12 mL/min (ref >60.00)
GLUCOSE: 139 mg/dL — AB (ref 70–99)
Potassium: 4.6 mEq/L (ref 3.5–5.1)
SODIUM: 139 meq/L (ref 135–145)
TOTAL PROTEIN: 7.1 g/dL (ref 6.0–8.3)

## 2016-06-20 LAB — TSH: TSH: 1.05 u[IU]/mL (ref 0.35–4.50)

## 2016-06-20 LAB — HEMOGLOBIN A1C: Hgb A1c MFr Bld: 6.8 % — ABNORMAL HIGH (ref 4.6–6.5)

## 2016-06-20 MED ORDER — HYDROCHLOROTHIAZIDE 12.5 MG PO CAPS: 12.5000 mg | ORAL_CAPSULE | Freq: Every day | ORAL | 3 refills | Status: DC

## 2016-06-20 MED ORDER — LOSARTAN POTASSIUM 100 MG PO TABS: 100.0000 mg | ORAL_TABLET | Freq: Every day | ORAL | 3 refills | Status: DC

## 2016-06-20 MED ORDER — PANTOPRAZOLE SODIUM 40 MG PO TBEC: 40.0000 mg | DELAYED_RELEASE_TABLET | Freq: Every day | ORAL | 3 refills | Status: DC

## 2016-06-20 NOTE — Progress Notes (Signed)
Pre visit review using our clinic review tool, if applicable. No additional management support is needed unless otherwise documented below in the visit note. 

## 2016-06-20 NOTE — Assessment & Plan Note (Signed)
No active GERD Continue PPI daily

## 2016-06-20 NOTE — Assessment & Plan Note (Signed)
Not currently on medication Check lipids

## 2016-06-20 NOTE — Assessment & Plan Note (Signed)
Follows at [facility]

## 2016-06-20 NOTE — Assessment & Plan Note (Signed)
BP well controlled, goal < 150/90 Current regimen effective and well tolerated Continue current medications at current doses cmp

## 2016-06-20 NOTE — Assessment & Plan Note (Signed)
a1c

## 2016-06-21 LAB — HEPATITIS C ANTIBODY: HCV Ab: NEGATIVE

## 2016-06-22 ENCOUNTER — Encounter: Payer: Self-pay | Admitting: Internal Medicine

## 2016-06-22 DIAGNOSIS — E119 Type 2 diabetes mellitus without complications: Secondary | ICD-10-CM | POA: Insufficient documentation

## 2016-06-22 DIAGNOSIS — E1169 Type 2 diabetes mellitus with other specified complication: Secondary | ICD-10-CM | POA: Insufficient documentation

## 2016-07-12 ENCOUNTER — Other Ambulatory Visit: Payer: Self-pay | Admitting: Internal Medicine

## 2016-07-12 DIAGNOSIS — K922 Gastrointestinal hemorrhage, unspecified: Secondary | ICD-10-CM

## 2016-09-01 ENCOUNTER — Other Ambulatory Visit: Payer: Self-pay | Admitting: Internal Medicine

## 2016-09-01 DIAGNOSIS — I10 Essential (primary) hypertension: Secondary | ICD-10-CM

## 2016-09-13 ENCOUNTER — Other Ambulatory Visit: Payer: Self-pay | Admitting: Internal Medicine

## 2016-09-13 DIAGNOSIS — I10 Essential (primary) hypertension: Secondary | ICD-10-CM

## 2016-09-13 NOTE — Telephone Encounter (Signed)
Contacted pt. Pt stated that the rx number on the bottle did not have any refills when he called the phamracy automated system. Informed that the rx number changes after refills are used up. Pt will call the pharmacy directly to have medication filled.

## 2016-09-16 ENCOUNTER — Other Ambulatory Visit: Payer: Self-pay | Admitting: Internal Medicine

## 2016-09-16 DIAGNOSIS — I10 Essential (primary) hypertension: Secondary | ICD-10-CM

## 2016-09-20 ENCOUNTER — Other Ambulatory Visit: Payer: Self-pay | Admitting: Internal Medicine

## 2016-09-20 DIAGNOSIS — I10 Essential (primary) hypertension: Secondary | ICD-10-CM

## 2016-09-21 NOTE — Patient Instructions (Addendum)
  Test(s) ordered today. Your results will be released to [place] (or called to you) after review, usually within 72hours after test completion. If any changes need to be made, you will be notified at that same time.  All other Health Maintenance issues reviewed.   All recommended immunizations and age-appropriate screenings are up-to-date or discussed.  No immunizations administered today.   Medications reviewed and updated.  No changes recommended at this time.  Your prescription(s) have been submitted to your pharmacy. Please take as directed and contact our office if you believe you are having problem(s) with the medication(s).  A referral was ordered for for sports medicine  Please followup in 6 months

## 2016-09-21 NOTE — Progress Notes (Signed)
Subjective:    Patient ID: Johnny [name], male    DOB: 1951/06/04, 66 y.o.   MRN: [id number]  HPI The patient is here for follow up.  Diabetes: He was diagnosed with diabetes 06/2016.  He is not on medication. He is compliant with a diabetic diet. He has lost weight.  He is exercising regularly.   He is up-to-date with an ophthalmology examination - but not since he was diagnosed.   Hypertension: He is taking his medication daily, but has been out for a few days. He is compliant with a low sodium diet.  He denies chest pain, palpitations, edema, shortness of breath and regular headaches. He is exercising regularly, 3-4 times a week.  He does not monitor his blood pressure at home.    GERD:  He is taking his medication daily as prescribed.  He denies any GERD symptoms and feels his GERD is well controlled.   Left wrist pain:  He has had left wrist pain for the past 2-3 weeks.  He thinks it may have happened when he lifted something a few weeks.  Medications and allergies reviewed with patient and updated if appropriate.  Patient Active Problem List   Diagnosis Date Noted  . Diabetes ([place]) 06/22/2016  . Upper GI bleeding 10/28/2015  . Rosacea 04/16/2015  . Hyperlipidemia 01/09/2009  . PROSTATE CANCER, HX OF 01/09/2009  . Essential hypertension 08/02/2007  . ABNORMAL ELECTROCARDIOGRAM 08/02/2007    Current Outpatient Prescriptions on File Prior to Visit  Medication Sig Dispense Refill  . b complex vitamins tablet Take 1 tablet by mouth daily.    . hydrochlorothiazide (MICROZIDE) 12.5 MG capsule Take 1 capsule (12.5 mg total) by mouth daily. 90 capsule 3  . losartan (COZAAR) 100 MG tablet Take 1 tablet (100 mg total) by mouth daily. 90 tablet 3  . Omega-3 Fatty Acids (OMEGA 3 PO) Take 1 tablet by mouth daily.     . pantoprazole (PROTONIX) 40 MG tablet Take 1 tablet (40 mg total) by mouth 2 (two) times daily. 180 tablet 3   No current facility-administered medications on file  prior to visit.     Past Medical History:  Diagnosis Date  . Hyperlipidemia    LDL goal = < 110  . Hypertension   . Prostate cancer [facility]) 2008   Dr Darcus Austin, [facility]    Past Surgical History:  Procedure Laterality Date  . CERVICAL FUSION  2007   C5-C7 , Dr Saintclair Halsted   . COLONOSCOPY  2006   negative; [place], [place]  . ESOPHAGOGASTRODUODENOSCOPY Left 10/28/2015   Procedure: ESOPHAGOGASTRODUODENOSCOPY (EGD);  Surgeon: Teena Irani, MD;  Location: [facility];  Service: Endoscopy;  Laterality: Left;  . PROSTATECTOMY  11/2007   Dr.Moul, [facility]  . WRIST SURGERY  1998   [place] bone resection 2006    Social History   Social History  . Marital status: Married    Spouse name: N/A  . Number of children: 3  . Years of education: N/A   Occupational History  . Teacher/Coach    Social History Main Topics  . Smoking status: Never Smoker  . Smokeless tobacco: Never Used  . Alcohol use No  . Drug use: No  . Sexual activity: Not Asked   Other Topics Concern  . None   Social History Narrative  . None    Family History  Problem Relation Age of Onset  . Breast cancer Mother   . Alzheimer's disease Mother   . Colon cancer Paternal  Grandmother   . Diabetes Paternal Grandfather   . Heart attack Paternal Grandfather 51  . Breast cancer Maternal Grandmother   . Heart attack Maternal Grandfather 72  . Heart disease Father     Atrial fibrillation  . Heart failure Father   . Stroke Neg Hx     Review of Systems  Constitutional: Negative for fever.  Respiratory: Negative for cough, shortness of breath and wheezing.   Cardiovascular: Negative for chest pain, palpitations and leg swelling.  Neurological: Negative for light-headedness and headaches.       Objective:   Vitals:   09/22/16 1056  BP: (!) 152/100  Pulse: 84  Resp: 16  Temp: 97.9 F (36.6 C)   Wt Readings from Last 3 Encounters:  09/22/16 226 lb (102.5 kg)  06/20/16 234 lb (106.1 kg)  11/14/15 229 lb 12.8 oz (104.2 kg)     Body mass index is 32.43 kg/m.   Physical Exam    Constitutional: Appears well-developed and well-nourished. No distress.  HENT:  Head: Normocephalic and atraumatic.  Neck: Neck supple. No tracheal deviation present. No thyromegaly present.  No cervical lymphadenopathy Cardiovascular: Normal rate, regular rhythm and normal heart sounds.   No murmur heard. No carotid bruit .  No edema Pulmonary/Chest: Effort normal and breath sounds normal. No respiratory distress. No has no wheezes. No rales.  Msk: left wrist medial swelling,mild pain with palpation Skin: Skin is warm and dry. Not diaphoretic.  Psychiatric: Normal mood and affect. Behavior is normal.      Assessment & Plan:    See Problem List for Assessment and Plan of chronic medical problems.

## 2016-09-22 ENCOUNTER — Ambulatory Visit (INDEPENDENT_AMBULATORY_CARE_PROVIDER_SITE_OTHER): Payer: Medicare Other | Admitting: Internal Medicine

## 2016-09-22 ENCOUNTER — Encounter: Payer: Self-pay | Admitting: Internal Medicine

## 2016-09-22 ENCOUNTER — Other Ambulatory Visit (INDEPENDENT_AMBULATORY_CARE_PROVIDER_SITE_OTHER): Payer: Medicare Other

## 2016-09-22 VITALS — BP 152/100 | HR 84 | Temp 97.9°F | Resp 16 | Wt 226.0 lb

## 2016-09-22 DIAGNOSIS — E119 Type 2 diabetes mellitus without complications: Secondary | ICD-10-CM | POA: Diagnosis not present

## 2016-09-22 DIAGNOSIS — M25532 Pain in left wrist: Secondary | ICD-10-CM | POA: Insufficient documentation

## 2016-09-22 DIAGNOSIS — K922 Gastrointestinal hemorrhage, unspecified: Secondary | ICD-10-CM

## 2016-09-22 DIAGNOSIS — E7800 Pure hypercholesterolemia, unspecified: Secondary | ICD-10-CM

## 2016-09-22 DIAGNOSIS — I10 Essential (primary) hypertension: Secondary | ICD-10-CM

## 2016-09-22 LAB — COMPREHENSIVE METABOLIC PANEL
ALBUMIN: 4.2 g/dL (ref 3.5–5.2)
ALK PHOS: 69 U/L (ref 39–117)
ALT: 33 U/L (ref 0–53)
AST: 25 U/L (ref 0–37)
BUN: 12 mg/dL (ref 6–23)
CHLORIDE: 102 meq/L (ref 96–112)
CO2: 28 mEq/L (ref 19–32)
CREATININE: 0.93 mg/dL (ref 0.40–1.50)
Calcium: 9.5 mg/dL (ref 8.4–10.5)
GFR: 86.54 mL/min (ref >60.00)
GLUCOSE: 112 mg/dL — AB (ref 70–99)
Potassium: 3.9 mEq/L (ref 3.5–5.1)
SODIUM: 137 meq/L (ref 135–145)
TOTAL PROTEIN: 7.1 g/dL (ref 6.0–8.3)
Total Bilirubin: 0.9 mg/dL (ref 0.2–1.2)

## 2016-09-22 LAB — LIPID PANEL
CHOL/HDL RATIO: 4
Cholesterol: 172 mg/dL (ref 0–200)
HDL: 41.4 mg/dL (ref >39.00)
LDL Cholesterol: 98 mg/dL (ref 0–99)
NonHDL: 130.13
Triglycerides: 163 mg/dL — ABNORMAL HIGH (ref 0.0–149.0)
VLDL: 32.6 mg/dL (ref 0.0–40.0)

## 2016-09-22 LAB — HEMOGLOBIN A1C: HEMOGLOBIN A1C: 6.2 % (ref 4.6–6.5)

## 2016-09-22 MED ORDER — LOSARTAN POTASSIUM 100 MG PO TABS: 100.0000 mg | ORAL_TABLET | Freq: Every day | ORAL | 3 refills | Status: DC

## 2016-09-22 NOTE — Assessment & Plan Note (Signed)
Has been compliant with a diabetic diet and has lost weight He is exercising regularly Will check a1c Will try to avoid medication

## 2016-09-22 NOTE — Assessment & Plan Note (Signed)
No GERD Continue protonix once daily

## 2016-09-22 NOTE — Assessment & Plan Note (Signed)
Pain for 2-3 weeks  Wearing a brace Referred to Dr Tamala Julian

## 2016-09-22 NOTE — Assessment & Plan Note (Signed)
Check lipid panel Continue healthy diet and regular exercise  

## 2016-09-22 NOTE — Progress Notes (Signed)
Pre visit review using our clinic review tool, if applicable. No additional management support is needed unless otherwise documented below in the visit note. 

## 2016-09-22 NOTE — Assessment & Plan Note (Signed)
BP typically well controlled - has been off medication for a few days Current regimen effective and well tolerated Continue current medications at current doses cmp

## 2016-09-24 NOTE — Progress Notes (Signed)
[name] [address], [place] [address] Phone: [phone] Subjective:    I'm seeing this patient by the request  of: Binnie Rail, MD    CC: Left wrist pain  [id number]  Johnny [name] is a 66 y.o. male coming in with complaint of left wrist pain. Patient states in May of recurrent 1-2 months ago. Patient was lifting some things. Patient states Possibly continuing to improve very slowly but states that certain movements causes a sharp severe pain on the lateral aspect of the wrist. Patient states that there has been some swelling but has not notice any bruising. Denies any weakness. Has been able to play golf and doesn't most daily activities but certain movements causes a severe pain that can cause him to almost drop things in his hand. Patient denies any nighttime awakening. Denies any numbness.    Past Medical History:  Diagnosis Date  . Hyperlipidemia    LDL goal = < 110  . Hypertension   . Prostate cancer [facility]) 2008   Dr Darcus Austin, [facility]   Past Surgical History:  Procedure Laterality Date  . CERVICAL FUSION  2007   C5-C7 , Dr Saintclair Halsted   . COLONOSCOPY  2006   negative; [place], [place]  . ESOPHAGOGASTRODUODENOSCOPY Left 10/28/2015   Procedure: ESOPHAGOGASTRODUODENOSCOPY (EGD);  Surgeon: Teena Irani, MD;  Location: [facility];  Service: Endoscopy;  Laterality: Left;  . PROSTATECTOMY  11/2007   Dr.Moul, [facility]  . WRIST SURGERY  1998   Hammate bone resection 2006   Social History   Social History  . Marital status: Married    Spouse name: N/A  . Number of children: 3  . Years of education: N/A   Occupational History  . Teacher/Coach    Social History Main Topics  . Smoking status: Never Smoker  . Smokeless tobacco: Never Used  . Alcohol use No  . Drug use: No  . Sexual activity: Not Asked   Other Topics Concern  . None   Social History Narrative  . None   No Known Allergies Family History  Problem Relation Age of Onset    . Breast cancer Mother   . Alzheimer's disease Mother   . Colon cancer Paternal Grandmother   . Diabetes Paternal Grandfather   . Heart attack Paternal Grandfather 87  . Breast cancer Maternal Grandmother   . Heart attack Maternal Grandfather 72  . Heart disease Father     Atrial fibrillation  . Heart failure Father   . Stroke Neg Hx     Past medical history, social, surgical and family history all reviewed in electronic medical record.  No pertanent information unless stated regarding to the chief complaint.   Review of Systems:Review of systems updated and as accurate as of 09/25/16  No headache, visual changes, nausea, vomiting, diarrhea, constipation, dizziness, abdominal pain, skin rash, fevers, chills, night sweats, weight loss, swollen lymph nodes, body aches, joint swelling,  chest pain, shortness of breath, mood changes.   Objective  Blood pressure 140/80, pulse 81, height 5\' 10"  (1.778 m), weight 225 lb (102.1 kg), SpO2 97 %. Systems examined below as of 09/25/16   General: No apparent distress alert and oriented x3 mood and affect normal, dressed appropriately.  HEENT: Pupils equal, extraocular movements intact  Respiratory: Patient's speak in full sentences and does not appear short of breath  Cardiovascular: No lower extremity edema, non tender, no erythema  Skin: Warm dry intact with no signs of infection  or rash on extremities or on axial skeleton.  Abdomen: Soft nontender  Neuro: Cranial nerves II through XII are intact, neurovascularly intact in all extremities with 2+ DTRs and 2+ pulses.  Lymph: No lymphadenopathy of posterior or anterior cervical chain or axillae bilaterally.  Gait normal with good balance and coordination.  MSK:  Non tender with full range of motion and good stability and symmetric strength and tone of shoulders, elbows, hip, knee and ankles bilaterally.  Wrist: left  Swelling over the lateral aspect of the wrist  Pain with ulnar deviation   Pain over pisiform.   No snuffbox tenderness. No tenderness over Canal of Guyon. Strength 5/5 in all directions without pain. Negative Finkelstein, tinel's and phalens. Negative Watson's test. Contralateral wrist unremarkable  MSK US performed of: left  This study was ordered, performed, and interpreted by Charlann Boxer D.O.  Wrist: All extensor compartments visualized  And ECU significant swelling of the tendon sheath. Pisiform bone does seem to have some arthritic changes first possible callus formation over an acute fracture.  IMPRESSION:  ECU tendinitis with possible pisiform fracture.    Impression and Recommendations:     This case required medical decision making of moderate complexity.      Note: This dictation was prepared with Dragon dictation along with smaller phrase technology. Any transcriptional errors that result from this process are unintentional.

## 2016-09-25 ENCOUNTER — Encounter: Payer: Self-pay | Admitting: Family Medicine

## 2016-09-25 ENCOUNTER — Encounter: Payer: Self-pay | Admitting: Internal Medicine

## 2016-09-25 ENCOUNTER — Ambulatory Visit: Payer: Self-pay

## 2016-09-25 ENCOUNTER — Ambulatory Visit (INDEPENDENT_AMBULATORY_CARE_PROVIDER_SITE_OTHER): Payer: Medicare Other | Admitting: Family Medicine

## 2016-09-25 VITALS — BP 140/80 | HR 81 | Ht 70.0 in | Wt 225.0 lb

## 2016-09-25 DIAGNOSIS — M25532 Pain in left wrist: Secondary | ICD-10-CM

## 2016-09-25 DIAGNOSIS — S63095A Other dislocation of left wrist and hand, initial encounter: Secondary | ICD-10-CM

## 2016-09-25 DIAGNOSIS — Z23 Encounter for immunization: Secondary | ICD-10-CM | POA: Diagnosis not present

## 2016-09-25 DIAGNOSIS — IMO0001 Reserved for inherently not codable concepts without codable children: Secondary | ICD-10-CM | POA: Insufficient documentation

## 2016-09-25 NOTE — Addendum Note (Signed)
Addended by: Douglass Rivers T on: 09/25/2016 09:22 AM   Modules accepted: Orders

## 2016-09-25 NOTE — Patient Instructions (Signed)
Good to see you.  Ice 20 minutes 2 times daily. Usually after activity and before bed. pennsaid pinkie amount topically 2 times daily as needed.  Vitamin D 2000 IU duaily to help with bone healing Wear brace day and night for 1 week then nightly for 2 weeks.  Start exercises in 2 weeks and do them 3-5 times a week.  See me again in 3 weeks. If not better we can do an injection if the bone looks good.

## 2016-09-25 NOTE — Assessment & Plan Note (Signed)
Patient is more on an tendinitis noted. I do believe that there could've been a potential for subluxation as well as a small fracture of the pisiform. Patient put in a brace, home exercises, topical anti-inflammatories and icing. Patient will start home exercises with range of motion in 2 weeks. Follow-up in 3 weeks to make sure healing appropriately. Continue pain we'll consider injection.

## 2016-10-10 DIAGNOSIS — C61 Malignant neoplasm of prostate: Secondary | ICD-10-CM | POA: Diagnosis not present

## 2016-10-10 DIAGNOSIS — N5231 Erectile dysfunction following radical prostatectomy: Secondary | ICD-10-CM | POA: Diagnosis not present

## 2016-10-15 NOTE — Progress Notes (Signed)
[name] [address] [place], [place] [address] Phone: [phone] Subjective:    I'm seeing this patient by the request  of: Binnie Rail, MD    CC: Left wrist pain f/u  [id number]  Johnny [name] is a 66 y.o. male coming in with complaint of left wrist pain. Patient was seen previously and had what appeared to be a pisiform fracture as well as a extensor carpi ulnaris tendon inflammation. States that he is doing approximately 60% better. Not having any severe pain. Starting From Time to Time. Patient States That He Has Improved His Range Of Motion. Doing the Exercises Occasionally. Feels the Vitamin D Has Been Beneficial.    Past Medical History:  Diagnosis Date  . Hyperlipidemia    LDL goal = < 110  . Hypertension   . Prostate cancer [facility]) 2008   Dr Darcus Austin, [facility]   Past Surgical History:  Procedure Laterality Date  . CERVICAL FUSION  2007   C5-C7 , Dr Saintclair Halsted   . COLONOSCOPY  2006   negative; [place], [place]  . ESOPHAGOGASTRODUODENOSCOPY Left 10/28/2015   Procedure: ESOPHAGOGASTRODUODENOSCOPY (EGD);  Surgeon: Teena Irani, MD;  Location: [facility];  Service: Endoscopy;  Laterality: Left;  . PROSTATECTOMY  11/2007   Dr.Moul, [facility]  . WRIST SURGERY  1998   Hammate bone resection 2006   Social History   Social History  . Marital status: Married    Spouse name: N/A  . Number of children: 3  . Years of education: N/A   Occupational History  . Teacher/Coach    Social History Main Topics  . Smoking status: Never Smoker  . Smokeless tobacco: Never Used  . Alcohol use No  . Drug use: No  . Sexual activity: Not on file   Other Topics Concern  . Not on file   Social History Narrative  . No narrative on file   No Known Allergies Family History  Problem Relation Age of Onset  . Breast cancer Mother   . Alzheimer's disease Mother   . Colon cancer Paternal Grandmother   . Diabetes Paternal Grandfather   . Heart attack Paternal  Grandfather 23  . Breast cancer Maternal Grandmother   . Heart attack Maternal Grandfather 72  . Heart disease Father     Atrial fibrillation  . Heart failure Father   . Stroke Neg Hx     Past medical history, social, surgical and family history all reviewed in electronic medical record.  No pertanent information unless stated regarding to the chief complaint.   Review of Systems: No headache, visual changes, nausea, vomiting, diarrhea, constipation, dizziness, abdominal pain, skin rash, fevers, chills, night sweats, weight loss, swollen lymph nodes, body aches, joint swelling, muscle aches, chest pain, shortness of breath, mood changes.    Objective  There were no vitals taken for this visit.   Systems examined below as of 10/16/16 General: NAD A&O x3 mood, affect normal  HEENT: Pupils equal, extraocular movements intact no nystagmus Respiratory: not short of breath at rest or with speaking Cardiovascular: No lower extremity edema, non tender Skin: Warm dry intact with no signs of infection or rash on extremities or on axial skeleton. Abdomen: Soft nontender, no masses Neuro: Cranial nerves  intact, neurovascularly intact in all extremities with 2+ DTRs and 2+ pulses. Lymph: No lymphadenopathy appreciated today  Gait normal with good balance and coordination.  MSK: Non tender with full range of motion and good stability and symmetric strength  and tone of shoulders, elbows,  knee hips and ankles bilaterally.   Wrist: left  No swelling noted Mild discomfort with ulnar deviation. No pain at this time..   No snuffbox tenderness. No tenderness over Canal of Guyon. Strength 5/5 in all directions without pain. Negative Finkelstein, tinel's and phalens. Negative Watson's test. Contralateral wrist unremarkable     Impression and Recommendations:     This case required medical decision making of moderate complexity.      Note: This dictation was prepared with Dragon dictation  along with smaller phrase technology. Any transcriptional errors that result from this process are unintentional.

## 2016-10-16 ENCOUNTER — Encounter: Payer: Self-pay | Admitting: Family Medicine

## 2016-10-16 ENCOUNTER — Ambulatory Visit (INDEPENDENT_AMBULATORY_CARE_PROVIDER_SITE_OTHER): Payer: Medicare Other | Admitting: Family Medicine

## 2016-10-16 DIAGNOSIS — IMO0001 Reserved for inherently not codable concepts without codable children: Secondary | ICD-10-CM

## 2016-10-16 DIAGNOSIS — S63095A Other dislocation of left wrist and hand, initial encounter: Secondary | ICD-10-CM | POA: Diagnosis not present

## 2016-10-16 NOTE — Patient Instructions (Signed)
Great to see you  Johnny [name] is your friend.  Exercises 2 times a week  Continue the vitamin D See me when you need me Get on the course again!

## 2016-10-16 NOTE — Assessment & Plan Note (Signed)
Seems to be doing very well at this time. Encourage patient started to increase activity. We discussed icing regimen. Follow-up again in 4-6 weeks for one more evaluation. Patient's goal is to get back to golf and will start slowly over the course of next several weeks.

## 2016-12-15 ENCOUNTER — Encounter: Payer: Self-pay | Admitting: Family Medicine

## 2016-12-15 ENCOUNTER — Ambulatory Visit: Payer: Self-pay

## 2016-12-15 ENCOUNTER — Ambulatory Visit (INDEPENDENT_AMBULATORY_CARE_PROVIDER_SITE_OTHER): Payer: Medicare Other | Admitting: Family Medicine

## 2016-12-15 VITALS — BP 144/88 | HR 69 | Wt 225.5 lb

## 2016-12-15 DIAGNOSIS — M1811 Unilateral primary osteoarthritis of first carpometacarpal joint, right hand: Secondary | ICD-10-CM | POA: Diagnosis not present

## 2016-12-15 DIAGNOSIS — M25539 Pain in unspecified wrist: Secondary | ICD-10-CM

## 2016-12-15 MED ORDER — VITAMIN D (ERGOCALCIFEROL) 1.25 MG (50000 UNIT) PO CAPS: 50000.0000 [IU] | ORAL_CAPSULE | ORAL | 0 refills | Status: DC

## 2016-12-15 NOTE — Patient Instructions (Signed)
Good to see you  Try the brace day and night for 1 week then nightly for 2 week. Once weekly vitam D for 12 weeks stop the daily  pennsaid pinkie amount topically 2 times daily as needed.  See me again in 4-6 weeks if not a lot better.

## 2016-12-15 NOTE — Progress Notes (Signed)
Pre-visit discussion using our clinic review tool. No additional management support is needed unless otherwise documented below in the visit note.  

## 2016-12-15 NOTE — Assessment & Plan Note (Signed)
Responded well to the injection. Patient and will put in a thumb spica. Patient given topical anti-inflammatories. We discussed ergonomics and lifting techniques. Patient follow-up again in 4-6 weeks for further evaluation.

## 2016-12-15 NOTE — Progress Notes (Signed)
[name] [address] [place], [place] [address] Phone: [phone] Subjective:    I'm seeing this patient by the request  of: Binnie Rail, MD    KY:HCWCB wrist pain   [id number]  Johnny [name] is a 66 y.o. male coming in with complaint of right wrist pain .This is new prone for patient. Patient has had a cervical fusion previously. States though that it seems to be localized around the thumb. Patient states that has been doing a lot more moving recently. Patient has been doing boxes and lifting. States that is having more pain at the base of the thumb. Certain movements causes severe amount of pain. Feels like it is swollen. Rates the severity of pain is 8 out of 10 and worsening. Affecting daily activities.    Past Medical History:  Diagnosis Date  . Hyperlipidemia    LDL goal = < 110  . Hypertension   . Prostate cancer [facility]) 2008   Dr Darcus Austin, [facility]   Past Surgical History:  Procedure Laterality Date  . CERVICAL FUSION  2007   C5-C7 , Dr Saintclair Halsted   . COLONOSCOPY  2006   negative; [place], [place]  . ESOPHAGOGASTRODUODENOSCOPY Left 10/28/2015   Procedure: ESOPHAGOGASTRODUODENOSCOPY (EGD);  Surgeon: Teena Irani, MD;  Location: [facility];  Service: Endoscopy;  Laterality: Left;  . PROSTATECTOMY  11/2007   Dr.Moul, [facility]  . WRIST SURGERY  1998   Hammate bone resection 2006   Social History   Social History  . Marital status: Married    Spouse name: N/A  . Number of children: 3  . Years of education: N/A   Occupational History  . Teacher/Coach    Social History Main Topics  . Smoking status: Never Smoker  . Smokeless tobacco: Never Used  . Alcohol use No  . Drug use: No  . Sexual activity: Not Asked   Other Topics Concern  . None   Social History Narrative  . None   No Known Allergies Family History  Problem Relation Age of Onset  . Breast cancer Mother   . Alzheimer's disease Mother   . Colon cancer Paternal Grandmother   .  Diabetes Paternal Grandfather   . Heart attack Paternal Grandfather 29  . Breast cancer Maternal Grandmother   . Heart attack Maternal Grandfather 72  . Heart disease Father     Atrial fibrillation  . Heart failure Father   . Stroke Neg Hx     Past medical history, social, surgical and family history all reviewed in electronic medical record.  No pertanent information unless stated regarding to the chief complaint.   Review of Systems: No headache, visual changes, nausea, vomiting, diarrhea, constipation, dizziness, abdominal pain, skin rash, fevers, chills, night sweats, weight loss, swollen lymph nodes, body aches, joint swelling, muscle aches, chest pain, shortness of breath, mood changes.    Objective  Blood pressure (!) 144/88, pulse 69, weight 225 lb 8 oz (102.3 kg), SpO2 98 %.   Systems examined below as of 12/15/16 General: NAD A&O x3 mood, affect normal  HEENT: Pupils equal, extraocular movements intact no nystagmus Respiratory: not short of breath at rest or with speaking Cardiovascular: No lower extremity edema, non tender Skin: Warm dry intact with no signs of infection or rash on extremities or on axial skeleton. Abdomen: Soft nontender, no masses Neuro: Cranial nerves  intact, neurovascularly intact in all extremities with 2+ DTRs and 2+ pulses. Lymph: No lymphadenopathy appreciated today  Gait  normal with good balance and coordination.  MSK: Non tender with full range of motion and good stability and symmetric strength and tone of shoulders, elbows,   knee hips and ankles bilaterally.  Arthritic changes of multiple joints Wrist exam shows the patient does have a positive grind test of the [facility] joint. Neurovascularly intact. Strength is 4 out of 5 compared to the contralateral side. Good capillary refill. Full range and strength of the wrist itself  Procedure: Real-time Ultrasound Guided Injection of  right CMC joint Device: GE Logiq Q7 Ultrasound guided injection is  preferred based studies that show increased duration, increased effect, greater accuracy, decreased procedural pain, increased response rate, and decreased cost with ultrasound guided versus blind injection.  Verbal informed consent obtained.  Time-out conducted.  Noted no overlying erythema, induration, or other signs of local infection.  Skin prepped in a sterile fashion.  Local anesthesia: Topical Ethyl chloride.  With sterile technique and under real time ultrasound guidance:  With a 25-gauge half-inch needle patient was injected with a total of 0.5 mL of 0.5% Marcaine and 0.5 mL of Kenalog 40 mg/dL. Completed without difficulty  Pain immediately resolved suggesting accurate placement of the medication.  Advised to call if fevers/chills, erythema, induration, drainage, or persistent bleeding.  Images permanently stored and available for review in the ultrasound unit.  Impression: Technically successful ultrasound guided injection.     Impression and Recommendations:     This case required medical decision making of moderate complexity.      Note: This dictation was prepared with Dragon dictation along with smaller phrase technology. Any transcriptional errors that result from this process are unintentional.

## 2017-01-14 ENCOUNTER — Telehealth: Payer: Self-pay | Admitting: Internal Medicine

## 2017-01-14 NOTE — Telephone Encounter (Signed)
Patient states he received call from [facility] yesterday in regard to [facility] form.  Patient states he will come by today to pick up.

## 2017-01-14 NOTE — Telephone Encounter (Signed)
Paperwork placed up front for pick up.

## 2017-01-16 ENCOUNTER — Telehealth: Payer: Self-pay | Admitting: Internal Medicine

## 2017-01-16 NOTE — Telephone Encounter (Signed)
Patient dropped off his DMV forms that Dr.Burns had filled out. He did not have one of the pages with it. Page 4 needs to be filled out. He put a stamped envelope with it. He asked if you would just it in the mail and let him know it was sent.

## 2017-01-19 NOTE — Telephone Encounter (Signed)
Pt called stating that he has a fax number that this can be faxed to or he can come pick it up. The fax number that he gave was [phone].

## 2017-01-21 NOTE — Telephone Encounter (Signed)
Faxed and please upfront for pick-up

## 2017-01-21 NOTE — Telephone Encounter (Signed)
Patient states to just fax the paper work and he will pick up forms tomorrow.

## 2017-01-21 NOTE — Telephone Encounter (Signed)
LVM informing pt paperwork is complete. Need to know where he would like it sent.

## 2017-01-22 ENCOUNTER — Ambulatory Visit (INDEPENDENT_AMBULATORY_CARE_PROVIDER_SITE_OTHER): Payer: Medicare Other | Admitting: Family Medicine

## 2017-01-22 ENCOUNTER — Encounter: Payer: Self-pay | Admitting: Family Medicine

## 2017-01-22 DIAGNOSIS — M1811 Unilateral primary osteoarthritis of first carpometacarpal joint, right hand: Secondary | ICD-10-CM

## 2017-01-22 NOTE — Progress Notes (Signed)
[name] [place] [address], [place] [address] Phone: [phone] Subjective:    I'm seeing this patient by the request  of: Binnie Rail, MD    UV:[id number] wrist pain f/u   [id number]  Johnny [name] is a 66 y.o. 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      Past Medical History:  Diagnosis Date  . Hyperlipidemia    LDL goal = < 110  . Hypertension   . Prostate cancer [facility]) 2008   Dr Darcus Austin, [facility]   Past Surgical History:  Procedure Laterality Date  . CERVICAL FUSION  2007   C5-C7 , Dr Saintclair Halsted   . COLONOSCOPY  2006   negative; [place], [place]  . ESOPHAGOGASTRODUODENOSCOPY Left 10/28/2015   Procedure: ESOPHAGOGASTRODUODENOSCOPY (EGD);  Surgeon: Teena Irani, MD;  Location: [facility];  Service: Endoscopy;  Laterality: Left;  . PROSTATECTOMY  11/2007   Dr.Moul, [facility]  . WRIST SURGERY  1998   Hammate bone resection 2006   Social History   Social History  . Marital status: Married    Spouse name: N/A  . Number of children: 3  . Years of education: N/A   Occupational History  . Teacher/Coach    Social History Main Topics  . Smoking status: Never Smoker  . Smokeless tobacco: Never Used  . Alcohol use No  . Drug use: No  . Sexual activity: Not Asked   Other Topics Concern  . None   Social History Narrative  . None   No Known Allergies Family History  Problem Relation Age of Onset  . Breast cancer Mother   . Alzheimer's disease Mother   . Colon cancer Paternal Grandmother   . Diabetes Paternal Grandfather   . Heart attack Paternal Grandfather 76  . Breast cancer Maternal Grandmother   . Heart attack Maternal Grandfather 72  . Heart disease Father        Atrial fibrillation  . Heart failure Father   . Stroke Neg Hx     Past medical history, social, surgical and family history all reviewed  in electronic medical record.  No pertanent information unless stated regarding to the chief complaint.   Review of Systems: No headache, visual changes, nausea, vomiting, diarrhea, constipation, dizziness, abdominal pain, skin rash, fevers, chills, night sweats, weight loss, swollen lymph nodes, body aches, joint swelling, muscle aches, chest pain, shortness of breath, mood changes.    Objective  Blood pressure (!) 150/90, pulse 83, resp. rate 16, weight 220 lb (99.8 kg), SpO2 96 %.   Systems examined below as of 01/22/17 General: NAD A&O x3 mood, affect normal  HEENT: Pupils equal, extraocular movements intact no nystagmus Respiratory: not short of breath at rest or with speaking Cardiovascular: No lower extremity edema, non tender Skin: Warm dry intact with no signs of infection or rash on extremities or on axial skeleton. Abdomen: Soft nontender, no masses Neuro: Cranial nerves  intact, neurovascularly intact in all extremities with 2+ DTRs and 2+ pulses. Lymph: No lymphadenopathy appreciated today  Gait normal with good balance and coordination.  MSK: Non tender with full range of motion and good stability and symmetric strength and tone of shoulders, elbows,   knee hips and ankles bilaterally.  Arthritic changes of multiple joints Wrist exam mild ttp over cmc on right, no swelling full rom and strength.  Contralateral wrist unremarkable.    Impression and Recommendations:     This case required medical decision making of moderate complexity.      Note: This dictation was prepared with Dragon dictation along with smaller phrase technology. Any transcriptional errors that result from this process are unintentional.

## 2017-01-22 NOTE — Assessment & Plan Note (Signed)
Doing 100% better Discussed we can repeat injections every 10 weeks, f/u prn

## 2017-01-22 NOTE — Patient Instructions (Signed)
Good to see you  Johnny [name] is your friend.  See me again in 6-8 weeks in case we need to do another injection.

## 2017-03-04 ENCOUNTER — Other Ambulatory Visit: Payer: Self-pay | Admitting: Family Medicine

## 2017-03-25 ENCOUNTER — Ambulatory Visit (INDEPENDENT_AMBULATORY_CARE_PROVIDER_SITE_OTHER): Payer: Medicare Other | Admitting: Internal Medicine

## 2017-03-25 ENCOUNTER — Encounter: Payer: Self-pay | Admitting: Family Medicine

## 2017-03-25 ENCOUNTER — Other Ambulatory Visit (INDEPENDENT_AMBULATORY_CARE_PROVIDER_SITE_OTHER): Payer: Medicare Other

## 2017-03-25 ENCOUNTER — Ambulatory Visit (INDEPENDENT_AMBULATORY_CARE_PROVIDER_SITE_OTHER): Payer: Medicare Other | Admitting: Family Medicine

## 2017-03-25 ENCOUNTER — Ambulatory Visit: Payer: Self-pay

## 2017-03-25 ENCOUNTER — Encounter: Payer: Self-pay | Admitting: Internal Medicine

## 2017-03-25 VITALS — BP 150/90 | HR 62 | Temp 97.8°F | Resp 16 | Wt 224.0 lb

## 2017-03-25 VITALS — BP 150/90 | HR 62 | Wt 224.0 lb

## 2017-03-25 DIAGNOSIS — M79602 Pain in left arm: Secondary | ICD-10-CM | POA: Diagnosis not present

## 2017-03-25 DIAGNOSIS — E7800 Pure hypercholesterolemia, unspecified: Secondary | ICD-10-CM | POA: Diagnosis not present

## 2017-03-25 DIAGNOSIS — K922 Gastrointestinal hemorrhage, unspecified: Secondary | ICD-10-CM | POA: Diagnosis not present

## 2017-03-25 DIAGNOSIS — I10 Essential (primary) hypertension: Secondary | ICD-10-CM

## 2017-03-25 DIAGNOSIS — M5412 Radiculopathy, cervical region: Secondary | ICD-10-CM

## 2017-03-25 DIAGNOSIS — E119 Type 2 diabetes mellitus without complications: Secondary | ICD-10-CM

## 2017-03-25 DIAGNOSIS — M2550 Pain in unspecified joint: Secondary | ICD-10-CM

## 2017-03-25 DIAGNOSIS — Z8546 Personal history of malignant neoplasm of prostate: Secondary | ICD-10-CM

## 2017-03-25 LAB — COMPREHENSIVE METABOLIC PANEL
ALBUMIN: 4.6 g/dL (ref 3.5–5.2)
ALK PHOS: 75 U/L (ref 39–117)
ALT: 40 U/L (ref 0–53)
AST: 31 U/L (ref 0–37)
BILIRUBIN TOTAL: 1.1 mg/dL (ref 0.2–1.2)
BUN: 16 mg/dL (ref 6–23)
CO2: 26 mEq/L (ref 19–32)
Calcium: 9.8 mg/dL (ref 8.4–10.5)
Chloride: 103 mEq/L (ref 96–112)
Creatinine, Ser: 0.93 mg/dL (ref 0.40–1.50)
GFR: 86.4 mL/min (ref >60.00)
Glucose, Bld: 121 mg/dL — ABNORMAL HIGH (ref 70–99)
POTASSIUM: 3.8 meq/L (ref 3.5–5.1)
SODIUM: 139 meq/L (ref 135–145)
TOTAL PROTEIN: 7.4 g/dL (ref 6.0–8.3)

## 2017-03-25 LAB — HEMOGLOBIN A1C: Hgb A1c MFr Bld: 6.3 % (ref 4.6–6.5)

## 2017-03-25 LAB — CBC WITH DIFFERENTIAL/PLATELET
BASOS PCT: 0.2 % (ref 0.0–3.0)
Basophils Absolute: 0 10*3/uL (ref 0.0–0.1)
EOS ABS: 0.2 10*3/uL (ref 0.0–0.7)
Eosinophils Relative: 2.4 % (ref 0.0–5.0)
HEMATOCRIT: 47 % (ref 39.0–52.0)
Hemoglobin: 15.9 g/dL (ref 13.0–17.0)
LYMPHS PCT: 26.4 % (ref 12.0–46.0)
Lymphs Abs: 2 10*3/uL (ref 0.7–4.0)
MCHC: 33.9 g/dL (ref 30.0–36.0)
MCV: 83.1 fl (ref 78.0–100.0)
Monocytes Absolute: 0.7 10*3/uL (ref 0.1–1.0)
Monocytes Relative: 8.9 % (ref 3.0–12.0)
NEUTROS ABS: 4.7 10*3/uL (ref 1.4–7.7)
Neutrophils Relative %: 62.1 % (ref 43.0–77.0)
PLATELETS: 274 10*3/uL (ref 150.0–400.0)
RBC: 5.66 Mil/uL (ref 4.22–5.81)
RDW: 14.8 % (ref 11.5–15.5)
WBC: 7.5 10*3/uL (ref 4.0–10.5)

## 2017-03-25 LAB — FERRITIN: Ferritin: 23.5 ng/mL (ref 22.0–322.0)

## 2017-03-25 LAB — LIPID PANEL
CHOL/HDL RATIO: 4
Cholesterol: 191 mg/dL (ref 0–200)
HDL: 46.7 mg/dL (ref >39.00)
LDL Cholesterol: 109 mg/dL — ABNORMAL HIGH (ref 0–99)
NONHDL: 144.02
Triglycerides: 177 mg/dL — ABNORMAL HIGH (ref 0.0–149.0)
VLDL: 35.4 mg/dL (ref 0.0–40.0)

## 2017-03-25 LAB — IBC PANEL
Iron: 96 ug/dL (ref 42–165)
Saturation Ratios: 20.8 % (ref 20.0–50.0)
TRANSFERRIN: 330 mg/dL (ref 212.0–360.0)

## 2017-03-25 LAB — TSH: TSH: 0.84 u[IU]/mL (ref 0.35–4.50)

## 2017-03-25 LAB — VITAMIN D 25 HYDROXY (VIT D DEFICIENCY, FRACTURES): VITD: 45.31 ng/mL (ref 30.00–100.00)

## 2017-03-25 MED ORDER — HYDROCHLOROTHIAZIDE 25 MG PO TABS: 25.0000 mg | ORAL_TABLET | Freq: Every day | ORAL | 3 refills | Status: DC

## 2017-03-25 MED ORDER — GABAPENTIN 100 MG PO CAPS: 200.0000 mg | ORAL_CAPSULE | Freq: Every day | ORAL | 3 refills | Status: DC

## 2017-03-25 NOTE — Patient Instructions (Addendum)
Have a eye exam and have him send [place] a report.   Test(s) ordered today. Your results will be released to [place] (or called to you) after review, usually within 72hours after test completion. If any changes need to be made, you will be notified at that same time.  All other Health Maintenance issues reviewed.   All recommended immunizations and age-appropriate screenings are up-to-date or discussed.  No immunizations administered today.   Medications reviewed and updated.  Changes include increasing hydrochlorothiazide to 25 mg daily.  Your prescription(s) have been submitted to your pharmacy. Please take as directed and contact our office if you believe you are having problem(s) with the medication(s).   Please followup in 6 months

## 2017-03-25 NOTE — Assessment & Plan Note (Signed)
Taking protonix daily  no gerd symptoms or symptoms of GI bleeding Continue above

## 2017-03-25 NOTE — Assessment & Plan Note (Signed)
Lipids controlled with diet Check lipid panel

## 2017-03-25 NOTE — Assessment & Plan Note (Signed)
Following with urology

## 2017-03-25 NOTE — Assessment & Plan Note (Signed)
BP elevated and above goal here and when he checks it Increase hctz to 25 mg daily Continue losartan 100 mg daily cmp

## 2017-03-25 NOTE — Progress Notes (Signed)
Subjective:    Patient ID: Johnny [name], male    DOB: [birth year], 66 y.o.   MRN: [id number]  HPI The patient is here for follow up.  Hypertension: He is taking his medication daily. He is compliant with a low sodium diet.  He denies chest pain, palpitations, edema, shortness of breath and regular headaches. He is exercising regularly, but did have a period of time he was not exercising.  He does monitor his blood pressure at [facility] - it has been a little high.    Diabetes: He is controlling his diabetes with diet. He is compliant with a diabetic diet. He is exercising regularly.  He checks his feet daily and denies foot lesions. He is up-to-date with an ophthalmology examination.   History of prostate cancer:  He is following with urology.   H/o UGIB:  He is taking the protonix daily and denies GERD symptoms.  He denies abdominal pain and black stools.   Medications and allergies reviewed with patient and updated if appropriate.  Patient Active Problem List   Diagnosis Date Noted  . Arthritis of carpometacarpal [facility]) joint of right thumb 12/15/2016  . Subluxation/dislocation ECU (extensor carpi ulnaris) tendon, left, initial encounter 09/25/2016  . Diabetes ([place]) 06/22/2016  . Upper GI bleeding 10/28/2015  . Rosacea 04/16/2015  . Hyperlipidemia 01/09/2009  . PROSTATE CANCER, HX OF 01/09/2009  . Essential hypertension 08/02/2007  . ABNORMAL ELECTROCARDIOGRAM 08/02/2007    Current Outpatient Prescriptions on File Prior to Visit  Medication Sig Dispense Refill  . b complex vitamins tablet Take 1 tablet by mouth daily.    . hydrochlorothiazide (MICROZIDE) 12.5 MG capsule Take 1 capsule (12.5 mg total) by mouth daily. 90 capsule 3  . losartan (COZAAR) 100 MG tablet Take 1 tablet (100 mg total) by mouth daily. 90 tablet 3  . Omega-3 Fatty Acids (OMEGA 3 PO) Take 1 tablet by mouth daily.     . pantoprazole (PROTONIX) 40 MG tablet Take 1 tablet (40 mg total) by mouth 2 (two) times  daily. (Patient taking differently: Take 40 mg by mouth daily. ) 180 tablet 3  . Vitamin D, Ergocalciferol, (DRISDOL) 50000 units CAPS capsule TAKE 1 CAPSULE (50,000 UNITS TOTAL) BY MOUTH EVERY 7 (SEVEN) DAYS. 12 capsule 0   No current facility-administered medications on file prior to visit.     Past Medical History:  Diagnosis Date  . Hyperlipidemia    LDL goal = < 110  . Hypertension   . Prostate cancer [facility]) 2008   Dr Darcus Austin, [facility]    Past Surgical History:  Procedure Laterality Date  . CERVICAL FUSION  2007   C5-C7 , Dr Saintclair Halsted   . COLONOSCOPY  2006   negative; [place], [place]  . ESOPHAGOGASTRODUODENOSCOPY Left 10/28/2015   Procedure: ESOPHAGOGASTRODUODENOSCOPY (EGD);  Surgeon: Teena Irani, MD;  Location: [facility];  Service: Endoscopy;  Laterality: Left;  . PROSTATECTOMY  11/2007   Dr.Moul, [facility]  . WRIST SURGERY  1998   Hammate bone resection 2006    Social History   Social History  . Marital status: Married    Spouse name: N/A  . Number of children: 3  . Years of education: N/A   Occupational History  . Teacher/Coach    Social History Main Topics  . Smoking status: Never Smoker  . Smokeless tobacco: Never Used  . Alcohol use No  . Drug use: No  . Sexual activity: Not on file   Other Topics Concern  . Not on  file   Social History Narrative  . No narrative on file    Family History  Problem Relation Age of Onset  . Breast cancer Mother   . Alzheimer's disease Mother   . Colon cancer Paternal Grandmother   . Diabetes Paternal Grandfather   . Heart attack Paternal Grandfather 44  . Breast cancer Maternal Grandmother   . Heart attack Maternal Grandfather 72  . Heart disease Father        Atrial fibrillation  . Heart failure Father   . Stroke Neg Hx     Review of Systems  Constitutional: Negative for chills and fever.  Respiratory: Negative for cough and wheezing.   Cardiovascular: Negative for chest pain, palpitations and leg swelling.    Gastrointestinal: Negative for abdominal pain.       No melena, no gerd  Neurological: Negative for light-headedness and headaches.       Objective:   Vitals:   03/25/17 1008  BP: (!) 150/90  Pulse: 62  Resp: 16  Temp: 97.8 F (36.6 C)   Wt Readings from Last 3 Encounters:  03/25/17 224 lb (101.6 kg)  03/25/17 224 lb (101.6 kg)  01/22/17 220 lb (99.8 kg)   Body mass index is 32.14 kg/m.   Physical Exam    Constitutional: Appears well-developed and well-nourished. No distress.  HENT:  Head: Normocephalic and atraumatic.  Neck: Neck supple. No tracheal deviation present. No thyromegaly present.  No cervical lymphadenopathy Cardiovascular: Normal rate, regular rhythm and normal heart sounds.   No murmur heard. No carotid bruit .  No edema Pulmonary/Chest: Effort normal and breath sounds normal. No respiratory distress. No has no wheezes. No rales.  Skin: Skin is warm and dry. Not diaphoretic.  Psychiatric: Normal mood and affect. Behavior is normal.   Diabetic Foot Exam - Simple   Simple Foot Form Diabetic Foot exam was performed with the following findings:  Yes 03/25/2017 11:07 AM  Visual Inspection No deformities, no ulcerations, no other skin breakdown bilaterally:  Yes Sensation Testing Intact to touch and monofilament testing bilaterally:  Yes Pulse Check Posterior Tibialis and Dorsalis pulse intact bilaterally:  Yes Comments       Assessment & Plan:    See Problem List for Assessment and Plan of chronic medical problems.

## 2017-03-25 NOTE — Progress Notes (Signed)
[name] [address] [place], [place] [address] Phone: [phone] Subjective:    I'm seeing this patient by the request  of: Binnie Rail, MD    TI:[id number] wrist pain f/u   [id number]  Johnny [name] is a 66 y.o. 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  Patient though is complaining of bilateral forearm pain. Patient describes pain as a dull, throbbing aching pain. Difficult to describe any more distance. Sometimes he notices that it causes also weakness. Patient discusses pain is concerning as he wants to continue to be active. Past medical history is significant for a ACDF and C5-C7     Past Medical History:  Diagnosis Date  . Hyperlipidemia    LDL goal = < 110  . Hypertension   . Prostate cancer [facility]) 2008   Dr Darcus Austin, [facility]   Past Surgical History:  Procedure Laterality Date  . CERVICAL FUSION  2007   C5-C7 , Dr Saintclair Halsted   . COLONOSCOPY  2006   negative; [place], [place]  . ESOPHAGOGASTRODUODENOSCOPY Left 10/28/2015   Procedure: ESOPHAGOGASTRODUODENOSCOPY (EGD);  Surgeon: Teena Irani, MD;  Location: [facility];  Service: Endoscopy;  Laterality: Left;  . PROSTATECTOMY  11/2007   Dr.Moul, [facility]  . WRIST SURGERY  1998   Hammate bone resection 2006   Social History   Social History  . Marital status: Married    Spouse name: N/A  . Number of children: 3  . Years of education: N/A   Occupational History  . Teacher/Coach    Social History Main Topics  . Smoking status: Never Smoker  . Smokeless tobacco: Never Used  . Alcohol use No  . Drug use: No  . Sexual activity: Not Asked   Other Topics Concern  . None   Social History Narrative  . None   No Known Allergies Family History  Problem Relation Age of Onset  . Breast cancer Mother   . Alzheimer's disease Mother   . Colon cancer Paternal  Grandmother   . Diabetes Paternal Grandfather   . Heart attack Paternal Grandfather 31  . Breast cancer Maternal Grandmother   . Heart attack Maternal Grandfather 72  . Heart disease Father        Atrial fibrillation  . Heart failure Father   . Stroke Neg Hx     Past medical history, social, surgical and family history all reviewed in electronic medical record.  No pertanent information unless stated regarding to the chief complaint.   Review of Systems: No headache, visual changes, nausea, vomiting, diarrhea, constipation, dizziness, abdominal pain, skin rash, fevers, chills, night sweats, weight loss, swollen lymph nodes, body aches, joint swelling, chest pain, shortness of breath, mood changes. Positive muscle aches some fatigue   Objective  Blood pressure (!) 150/90, pulse 62, weight 224 lb (101.6 kg), SpO2 98 %.   Systems examined below as of 03/25/17 General: NAD A&O x3 mood, affect normal  HEENT: Pupils equal, extraocular movements intact no nystagmus Respiratory: not short of breath at rest or with speaking Cardiovascular: No lower extremity edema, non tender Skin: Warm dry intact with no signs of infection or rash on extremities or on axial skeleton. Abdomen: Soft nontender, no masses Neuro: Cranial nerves  intact, neurovascularly intact in all extremities with 2+ DTRs and 2+ pulses. Lymph: No lymphadenopathy appreciated today  Gait  normal with good balance and coordination.  MSK: tender with full range of motion and good stability and symmetric strength and tone of shoulders, elbows, wrist,  knee hips and ankles bilaterally.   Patient on exam today and does have some tenderness Multiple joints. No true findings of the elbow or the wrist with near full range of motion. Good grip strength. Neurovascular intact. Deep tendon reflexes intact.  Neck: Inspection unremarkable. No palpable stepoffs. Negative Spurling's maneuver. Significant limitation lacking the last 15 of  extension. Grip strength and sensation normal in bilateral hands Strength good C4 to T1 distribution No sensory change to C4 to T1 Negative Hoffman sign bilaterally Reflexes normal   Impression and Recommendations:     This case required medical decision making of moderate complexity.      Note: This dictation was prepared with Dragon dictation along with smaller phrase technology. Any transcriptional errors that result from this process are unintentional.

## 2017-03-25 NOTE — Assessment & Plan Note (Signed)
Diet controlled Continue regular exercise Continue low sugar/carb diet Check a1c

## 2017-03-25 NOTE — Patient Instructions (Signed)
Good to see you  We will get labs to make sure everything is going fine.  Gabapentin 200mg  at night The cyst looks normal, we will monitor.  See me again in 4 weeks.

## 2017-03-25 NOTE — Assessment & Plan Note (Signed)
Patient is having cervical radiculopathy. I believe that this is more in the C4 and surprise worsening T1 distribution on the patient today. Patient does not have any weakness. We will get labs to rule out any other organic causes a could be contributing to some of the weakness patient is Standing in his subjective findings. We discussed with patient about icing regimen. Gabapentin did started. Discussed over-the-counter supplementation including possible iron. Has had difficulty with GI bleeding previously. Patient continues to have difficulty possible advance imaging could be warranted. Patient will follow-up again in 4-6 weeks.

## 2017-03-26 ENCOUNTER — Encounter: Payer: Self-pay | Admitting: Internal Medicine

## 2017-03-26 LAB — ANTI-NUCLEAR AB-TITER (ANA TITER): ANA Titer 1: 1:40 {titer} — ABNORMAL HIGH

## 2017-03-26 LAB — PTH, INTACT AND CALCIUM
Calcium: 9.4 mg/dL (ref 8.6–10.3)
PTH: 61 pg/mL (ref 14–64)

## 2017-03-26 LAB — CALCIUM, IONIZED: CALCIUM ION: 5.1 mg/dL (ref 4.8–5.6)

## 2017-03-26 LAB — RHEUMATOID FACTOR

## 2017-03-26 LAB — ANA: Anti Nuclear Antibody(ANA): POSITIVE — AB

## 2017-04-11 IMAGING — DX DG CHEST 1V PORT
1 series · 1 of 1 positions shown · non-contrast
Comparison: Chest radiographs 04/23/2006.

CLINICAL DATA: Hematemesis today with generalized weakness. History
of hypertension and prostate cancer. Nonsmoker.

EXAM:
PORTABLE CHEST 1 VIEW

[chest ap]
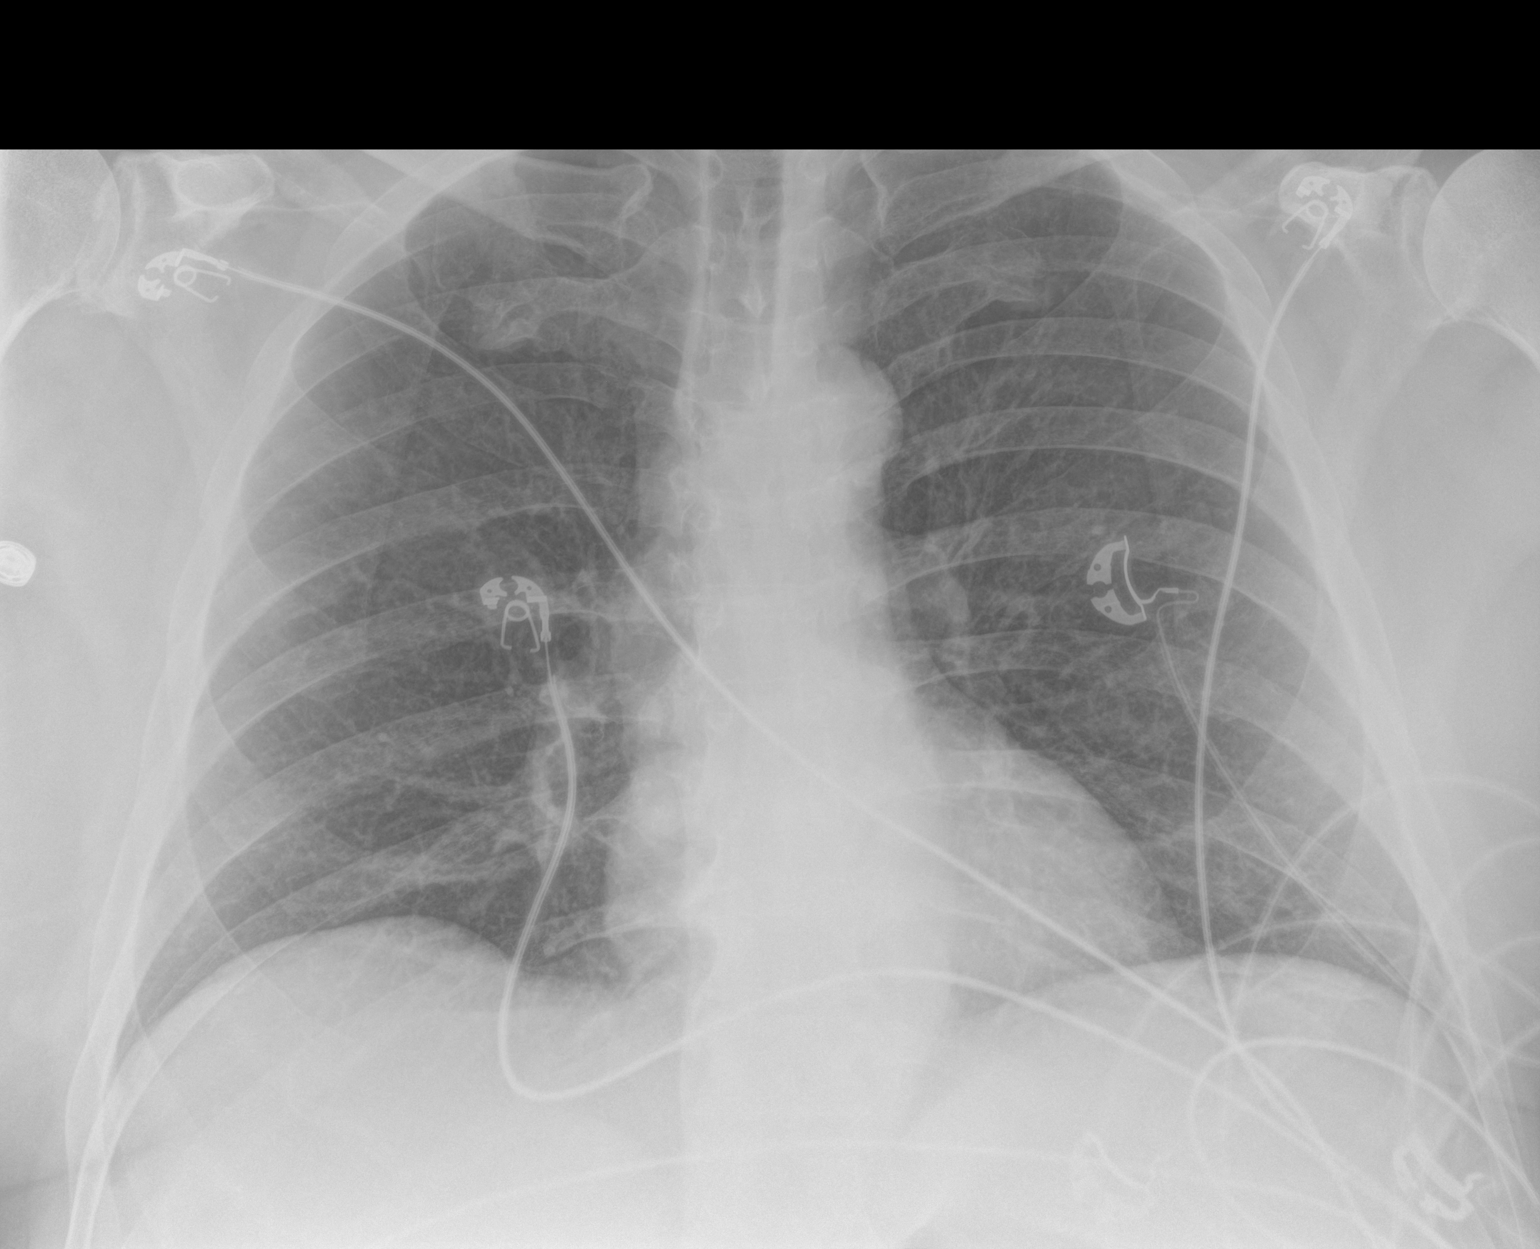

[1 of 1 positions shown; findings below may reference images not displayed]

FINDINGS: 3833 hours. The heart size and mediastinal contours are normal. The
lungs are clear. There is no pleural effusion or pneumothorax. No
acute osseous findings are identified. There are postsurgical
changes status post lower cervical fusion. Telemetry leads overlie
the chest.
IMPRESSION: No active cardiopulmonary process.

## 2017-04-22 ENCOUNTER — Encounter: Payer: Self-pay | Admitting: Family Medicine

## 2017-04-22 ENCOUNTER — Ambulatory Visit (INDEPENDENT_AMBULATORY_CARE_PROVIDER_SITE_OTHER)
Admission: RE | Admit: 2017-04-22 | Discharge: 2017-04-22 | Disposition: A | Discharge status: Home / Self Care | Payer: Medicare Other | Source: Ambulatory Visit | Attending: Family Medicine | Admitting: Family Medicine

## 2017-04-22 ENCOUNTER — Ambulatory Visit (INDEPENDENT_AMBULATORY_CARE_PROVIDER_SITE_OTHER): Payer: Medicare Other | Admitting: Family Medicine

## 2017-04-22 VITALS — BP 130/82 | HR 62 | Ht 70.0 in | Wt 222.0 lb

## 2017-04-22 DIAGNOSIS — M5412 Radiculopathy, cervical region: Secondary | ICD-10-CM

## 2017-04-22 DIAGNOSIS — M25521 Pain in right elbow: Secondary | ICD-10-CM

## 2017-04-22 DIAGNOSIS — M542 Cervicalgia: Secondary | ICD-10-CM | POA: Diagnosis not present

## 2017-04-22 NOTE — Assessment & Plan Note (Signed)
Still believe that some of this is secondary to more of the cervical radiculopathy. Discussed with patient about continuing to monitor the elbow itself. We will continue to see what one on. Past medical history significant for prostate cancer and does have a small mass than. 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 We will have patient follow-up with me again in 3-4 weeks. X-rays of the cervical spine and elbow ordered today secondary to then changes as stated above. Worsening pain she'll call sooner. Continue other conservative therapy

## 2017-04-22 NOTE — Patient Instructions (Signed)
Good to see you  Overall I think you are doing well  Try to stay off the gabapentin for 5 nights, if worse then lets start again  Xrays downstairs Keep doing the exercises See me again in 4 weeks.

## 2017-04-22 NOTE — Progress Notes (Signed)
[name] [address] [place], [place] [address] Phone: [phone] Subjective:    I'm seeing this patient by the request  of: Binnie Rail, MD    BJ:YNWGN wrist pain f/u   [id number]  Johnny [name] is a 66 y.o. 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 Patient though is complaining of bilateral forearm pain.  Past medical history is significant for a ACDF and C5-C7 it appears the patient was having more of a cervical radiculopathy. Started on gabapentin and was to monitor. Patient states 50% improvement. Patient states that the thing that seems to be the most concerning is that the elbow pain that he continues to have. States that certain range of motion on the right side causes a severe sharp pain with some mild radiation down the arm. Denies any fever, chills, any abnormal weight loss. Is able to notice more pain over the lateral epicondylar region is where he points as well as the antecubital fossa.  Past Medical History:  Diagnosis Date  . Hyperlipidemia    LDL goal = < 110  . Hypertension   . Prostate cancer [facility]) 2008   Dr Darcus Austin, [facility]   Past Surgical History:  Procedure Laterality Date  . CERVICAL FUSION  2007   C5-C7 , Dr Saintclair Halsted   . COLONOSCOPY  2006   negative; [place], [place]  . ESOPHAGOGASTRODUODENOSCOPY Left 10/28/2015   Procedure: ESOPHAGOGASTRODUODENOSCOPY (EGD);  Surgeon: Teena Irani, MD;  Location: [facility];  Service: Endoscopy;  Laterality: Left;  . PROSTATECTOMY  11/2007   Dr.Moul, [facility]  . WRIST SURGERY  1998   Hammate bone resection 2006   Social History   Social History  . Marital status: Married    Spouse name: N/A  . Number of children: 3  . Years of education: N/A   Occupational History  . Teacher/Coach    Social History Main Topics  . Smoking status: Never Smoker    . Smokeless tobacco: Never Used  . Alcohol use No  . Drug use: No  . Sexual activity: Not Asked   Other Topics Concern  . None   Social History Narrative  . None   No Known Allergies Family History  Problem Relation Age of Onset  . Breast cancer Mother   . Alzheimer's disease Mother   . Colon cancer Paternal Grandmother   . Diabetes Paternal Grandfather   . Heart attack Paternal Grandfather 28  . Breast cancer Maternal Grandmother   . Heart attack Maternal Grandfather 72  . Heart disease Father        Atrial fibrillation  . Heart failure Father   . Stroke Neg Hx     Past medical history, social, surgical and family history all reviewed in electronic medical record.  No pertanent information unless stated regarding to the chief complaint.   Review of Systems: No headache, visual changes, nausea, vomiting, diarrhea, constipation, dizziness, abdominal pain, skin rash, fevers, chills, night sweats, weight loss, swollen lymph nodes, body aches, joint swelling,  chest pain, shortness of breath, mood changes. Positive muscle aches   Objective  Blood pressure 130/82, pulse 62, height 5\' 10"  (1.778 m), weight 222 lb (100.7 kg), SpO2 97 %.   Systems examined below as of 04/22/17 General: NAD A&O x3 mood, affect normal  HEENT: Pupils equal, extraocular movements  intact no nystagmus Respiratory: not short of breath at rest or with speaking Cardiovascular: No lower extremity edema, non tender Skin: Warm dry intact with no signs of infection or rash on extremities or on axial skeleton. Abdomen: Soft nontender, no masses Neuro: Cranial nerves  intact, neurovascularly intact in all extremities with 2+ DTRs and 2+ pulses. Lymph: No lymphadenopathy appreciated today  Gait normal with good balance and coordination.  MSK: Non tender with full range of motion and good stability and symmetric strength and tone of shoulders, , wrist,  knee hips and ankles bilaterally.  Arthritic changes of  multiple joints  Neck: Inspection unremarkable. No palpable stepoffs. Negative Spurling's maneuver. Still lacking about 15 of extension as well as minimal sidebending bilaterally Grip strength and sensation normal in bilateral hands Strength good C4 to T1 distribution No sensory change to C4 to T1 Negative Hoffman sign bilaterally Reflexes normal Elbow: Right Inspection does show the patient is a fullness of the antecubital fossa. Minorly tender in this area. Not palpable mass. Seems to be freely movable but is tender to palpation. No signs of infection Range of motion full pronation, supination, flexion, extension. Strength is full to all of the above directions Stable to varus, valgus stress. Negative moving valgus stress test. No discrete areas of tenderness to palpation. Ulnar nerve does not sublux. Negative cubital tunnel Tinel's. Contralateral elbow unremarkable   Impression and Recommendations:     This case required medical decision making of moderate complexity.      Note: This dictation was prepared with Dragon dictation along with smaller phrase technology. Any transcriptional errors that result from this process are unintentional.

## 2017-05-20 ENCOUNTER — Ambulatory Visit (INDEPENDENT_AMBULATORY_CARE_PROVIDER_SITE_OTHER): Payer: Medicare Other | Admitting: Family Medicine

## 2017-05-20 ENCOUNTER — Encounter: Payer: Self-pay | Admitting: Family Medicine

## 2017-05-20 ENCOUNTER — Ambulatory Visit: Payer: Self-pay

## 2017-05-20 VITALS — BP 122/80 | HR 64 | Ht 70.0 in | Wt 221.0 lb

## 2017-05-20 DIAGNOSIS — M5412 Radiculopathy, cervical region: Secondary | ICD-10-CM | POA: Diagnosis not present

## 2017-05-20 DIAGNOSIS — M7521 Bicipital tendinitis, right shoulder: Secondary | ICD-10-CM | POA: Diagnosis not present

## 2017-05-20 DIAGNOSIS — M25521 Pain in right elbow: Secondary | ICD-10-CM | POA: Diagnosis not present

## 2017-05-20 NOTE — Progress Notes (Signed)
[name] [name], [place] [address] Phone: [phone] Subjective:     CC: Right elbow follow-up  [id number]  Johnny [name] is a 66 y.o. male coming in with complaint of right elbow pain. He is still having pain. No better or nor worse than last visit. If he moves his wrist in a certain way it bothers him but he is unsure of the what motions bother him. Has not been. Still able to do daily activities. Sometimes a sharp pain. Minimal impingement with regular daily activities.  Also is having signs with cervical radiculopathy. Patient has had cervical fusion previously. Patient started on gabapentin and feels like this has helped his neck but did not notice much improvement with the right elbow.    Past Medical History:  Diagnosis Date  . Hyperlipidemia    LDL goal = < 110  . Hypertension   . Prostate cancer [facility]) 2008   Dr Darcus Austin, [facility]   Past Surgical History:  Procedure Laterality Date  . CERVICAL FUSION  2007   C5-C7 , Dr Saintclair Halsted   . COLONOSCOPY  2006   negative; [place], [place]  . ESOPHAGOGASTRODUODENOSCOPY Left 10/28/2015   Procedure: ESOPHAGOGASTRODUODENOSCOPY (EGD);  Surgeon: Teena Irani, MD;  [facility];  Service: Endoscopy;  Laterality: Left;  . PROSTATECTOMY  11/2007   Dr.Moul, [facility]  . WRIST SURGERY  1998   Hammate bone resection 2006   Social History   Social History  . Marital status: Married    Spouse name: N/A  . Number of children: 3  . Years of education: N/A   Occupational History  . Teacher/Coach    Social History Main Topics  . Smoking status: Never Smoker  . Smokeless tobacco: Never Used  . Alcohol use No  . Drug use: No  . Sexual activity: Not Asked   Other Topics Concern  . None   Social History Narrative  . None   No Known Allergies Family History  Problem Relation Age of Onset  . Breast cancer Mother   . Alzheimer's disease Mother   . Colon cancer Paternal Grandmother   .  Diabetes Paternal Grandfather   . Heart attack Paternal Grandfather 69  . Breast cancer Maternal Grandmother   . Heart attack Maternal Grandfather 72  . Heart disease Father        Atrial fibrillation  . Heart failure Father   . Stroke Neg Hx      Past medical history, social, surgical and family history all reviewed in electronic medical record.  No pertanent information unless stated regarding to the chief complaint.   Review of Systems:Review of systems updated and as accurate as of 05/20/17  No headache, visual changes, nausea, vomiting, diarrhea, constipation, dizziness, abdominal pain, skin rash, fevers, chills, night sweats, weight loss, swollen lymph nodes, body aches, joint swelling, muscle aches, chest pain, shortness of breath, mood changes.   Objective  Blood pressure 122/80, pulse 64, height 5\' 10"  (1.778 m), weight 221 lb (100.2 kg). Systems examined below as of 05/20/17   General: No apparent distress alert and oriented x3 mood and affect normal, dressed appropriately.  HEENT: Pupils equal, extraocular movements intact  Respiratory: Patient's speak in full sentences and does not appear short of breath  Cardiovascular: No lower extremity edema, non tender, no erythema  Skin: Warm dry intact with no signs of infection or rash on extremities or on axial skeleton.  Abdomen: Soft nontender  Neuro: Cranial nerves II  through XII are intact, neurovascularly intact in all extremities with 2+ DTRs and 2+ pulses.  Lymph: No lymphadenopathy of posterior or anterior cervical chain or axillae bilaterally.  Gait normal with good balance and coordination.  MSK:  Non tender with full range of motion and good stability and symmetric strength and tone of shoulders, wrist, hip, knee and ankles bilaterally.  Mild arthritic changes of multiple joints  Elbow: Right Patient does have fullness of the antecubital fossa compared to contralateral sign. Range of motion full pronation, supination,  flexion, extension. Strength is full to all of the above directions Stable to varus, valgus stress. Negative moving valgus stress test. Mild tenderness over the cubital fossa Ulnar nerve does not sublux. Negative cubital tunnel Tinel's.  contralateral elbow unremarkable   Musculoskeletal ultrasound was performed and interpreted by Charlann Boxer D.O.   Elbow:  Limited study of the antecubital fossa shows the patient does have fullness noted. Seems to be more of a furcal sprain. Patient did have a tendinitis noted at the bicipital tuberosity of the tendon with mild increase in Doppler flow. No masses appreciated.  IMPRESSION:  Mild bicep tendinitis.  Impression and Recommendations:     This case required medical decision making of moderate complexity.      Note: This dictation was prepared with Dragon dictation along with smaller phrase technology. Any transcriptional errors that result from this process are unintentional.

## 2017-05-20 NOTE — Assessment & Plan Note (Signed)
Patient does have more of a bicep tendinitis. Discussed with patient at great length. We discussed icing regimen, home exercises, which activities doing which ones to avoid. Patient will try compression. Overlying vascular changes noted but appears to be more of a varicose vein no masses appreciated. Worsening symptoms at any point we'll consider advanced imaging but I do not think it is necessary.

## 2017-05-20 NOTE — Assessment & Plan Note (Signed)
Seems stable with patient taking a little bit of the gabapentin. Will continue to monitor. X-rays that show some adjacent segment disease and we will monitor closely. Worsening symptoms CT milligrams may be necessary but at this point patient is doing well with conservative therapy.

## 2017-05-20 NOTE — Patient Instructions (Signed)
Good to see you  Ice is your friend Compression sleeve with activity  Stay active.  If worsening pain see me sooner Otherwise see me again in 2 months

## 2017-05-29 ENCOUNTER — Other Ambulatory Visit: Payer: Self-pay | Admitting: Family Medicine

## 2017-06-25 NOTE — Assessment & Plan Note (Signed)
Check lipid panel  Controled with diet Regular exercise and healthy diet encouraged

## 2017-06-25 NOTE — Patient Instructions (Addendum)
  Mr. Ide , Thank you for taking time to come for your Medicare Wellness Visit. I appreciate your ongoing commitment to your health goals. Please review the following plan we discussed and let me know if I can assist you in the future.   These are the goals we discussed: Goals    Monitor bp at home      This is a list of the screening recommended for you and due dates:  Health Maintenance  Topic Date Due  . Eye exam for diabetics  03/28/1961  . Flu Shot  Given today  . Hemoglobin A1C  09/25/2017  . Pneumonia vaccines (2 of 2 - PPSV23) 09/25/2017  . Complete foot exam   03/25/2018  . Tetanus Vaccine  02/02/2022  . Colon Cancer Screening  01/10/2026  .  Hepatitis C: One time screening is recommended by Center for Disease Control  (CDC) for  adults born from [birth year] through 1965.   Completed     Test(s) ordered today. Your results will be released to [facility] (or called to you) after review, usually within 72hours after test completion. If any changes need to be made, you will be notified at that same time.  All other Health Maintenance issues reviewed.   All recommended immunizations and age-appropriate screenings are up-to-date or discussed. Flu immunization administered today.     Medications reviewed and updated.  No changes recommended at this time.   Please followup in 6 months

## 2017-06-25 NOTE — Progress Notes (Signed)
Subjective:    Patient ID: Johnny [name], male    DOB: 09/10/1951, 66 y.o.   MRN: [id number]  HPI Here for medicare wellness exam and follow up of his chronic medical problems.   I have personally reviewed and have noted 1.The patient's medical and social history 2.Their use of alcohol, tobacco or illicit drugs 3.Their current medications and supplements 4.The patient's functional ability including ADL's, fall risks, home                 safety risk and hearing or visual impairment. 5.Diet and physical activities 6.Evidence for depression or mood disorders 7.Care team reviewed  -  [facility] [facility] every 6 months  Hypertension: He is taking his medication daily. He is compliant with a low sodium diet.  He denies chest pain, palpitations, edema, shortness of breath and regular headaches. He is exercising regularly.  He does monitor his blood pressure at home - it is well controlled.    GERD:  He is taking his medication daily as prescribed.  He denies any GERD symptoms and feels his GERD is well controlled.   Diabetes: He is taking his medication daily as prescribed. He is compliant with a diabetic diet. He is exercising regularly.  He checks his feet daily and denies foot lesions. He is up-to-date with an ophthalmology examination.    Are there smokers in your home (other than you)? No  Risk Factors Exercise:    Gym 4-5 / week Dietary issues discussed:   Well balanced  Cardiac risk factors: advanced age, hypertension, hyperlipidemia, and obesity (BMI >= 30 kg/m2).  Depression Screen  Have you felt down, depressed or hopeless? No  Have you felt little interest or pleasure in doing things?  No  Activities of Daily Living In your present state of health, do you have any difficulty performing the following activities?:  Driving? No Managing money?  No Feeding yourself? No Getting from bed to chair? No Climbing a  flight of stairs? No Preparing food and eating?: No Bathing or showering? No Getting dressed: No Getting to/using the toilet? No Moving around from place to place: No In the past year have you fallen or had a near fall?: No   Do you have more than one partner?  No   Hearing Difficulties: No Do you often ask people to speak up or repeat themselves? No Do you experience ringing or noises in your ears? No Do you have difficulty understanding soft or whispered voices? No Vision:              Any change in vision:  no             Up to date with eye exam: yes, due - will schedule  Memory:  Do you feel that you have a problem with memory? No  Do you often misplace items? No  Do you feel safe at home?  Yes  Cognitive Testing  Alert, Orientated? Yes  Normal Appearance? Yes  Recall of three objects?  Yes  Can perform simple calculations? Yes  Displays appropriate judgment? Yes  Can read the correct time from a watch face? Yes   Advanced Directives have been discussed with the patient? Yes   Medications and allergies reviewed with patient and updated if appropriate.  Patient Active Problem List   Diagnosis Date Noted  . Biceps tendinitis on right 05/20/2017  . Cervical radiculopathy 03/25/2017  . 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  Subluxation/dislocation ECU (extensor carpi ulnaris) tendon, left, initial encounter 09/25/2016  . Diabetes ([place]) 06/22/2016  . Upper GI bleeding 10/28/2015  . Rosacea 04/16/2015  . Hyperlipidemia 01/09/2009  . PROSTATE CANCER, HX OF 01/09/2009  . Essential hypertension 08/02/2007  . ABNORMAL ELECTROCARDIOGRAM 08/02/2007    Current Outpatient Prescriptions on File Prior to Visit  Medication Sig Dispense Refill  . b complex vitamins tablet Take 1 tablet by mouth daily.    Marland Kitchen gabapentin (NEURONTIN) 100 MG capsule Take 2 capsules (200 mg total) by mouth at bedtime. 60 capsule 3  . hydrochlorothiazide (HYDRODIURIL)  25 MG tablet Take 1 tablet (25 mg total) by mouth daily. 90 tablet 3  . losartan (COZAAR) 100 MG tablet Take 1 tablet (100 mg total) by mouth daily. 90 tablet 3  . Omega-3 Fatty Acids (OMEGA 3 PO) Take 1 tablet by mouth daily.     . pantoprazole (PROTONIX) 40 MG tablet Take 1 tablet (40 mg total) by mouth 2 (two) times daily. (Patient taking differently: Take 40 mg by mouth daily. ) 180 tablet 3  . Vitamin D, Ergocalciferol, (DRISDOL) 50000 units CAPS capsule TAKE 1 CAPSULE (50,000 UNITS TOTAL) BY MOUTH EVERY 7 (SEVEN) DAYS. 12 capsule 0   No current facility-administered medications on file prior to visit.     Past Medical History:  Diagnosis Date  . Hyperlipidemia    LDL goal = < 110  . Hypertension   . Prostate cancer [facility]) 2008   Dr Darcus Austin, [facility]    Past Surgical History:  Procedure Laterality Date  . CERVICAL FUSION  2007   C5-C7 , Dr Saintclair Halsted   . COLONOSCOPY  2006   negative; [place],   . ESOPHAGOGASTRODUODENOSCOPY Left 10/28/2015   Procedure: ESOPHAGOGASTRODUODENOSCOPY (EGD);  Surgeon: Teena Irani, MD;  Location: [facility];  Service: Endoscopy;  Laterality: Left;  . PROSTATECTOMY  11/2007   Dr.Moul, [facility]  . WRIST SURGERY  1998   Hammate bone resection 2006    Social History   Social History  . Marital status: Married    Spouse name: N/A  . Number of children: 3  . Years of education: N/A   Occupational History  . Teacher/Coach    Social History Main Topics  . Smoking status: Never Smoker  . Smokeless tobacco: Never Used  . Alcohol use No  . Drug use: No  . Sexual activity: Not Asked   Other Topics Concern  . None   Social History Narrative  . None    Family History  Problem Relation Age of Onset  . Breast cancer Mother   . Alzheimer's disease Mother   . Colon cancer Paternal Grandmother   . Diabetes Paternal Grandfather   . Heart attack Paternal Grandfather 72  . Breast cancer Maternal Grandmother   . Heart attack Maternal Grandfather 72  . Heart  disease Father        Atrial fibrillation  . Heart failure Father   . Stroke Neg Hx     Review of Systems  Constitutional: Negative for chills and fever.  Respiratory: Negative for cough, shortness of breath and wheezing.   Cardiovascular: Negative for chest pain, palpitations and leg swelling.  Gastrointestinal: Negative for abdominal pain, blood in stool, constipation and diarrhea.  Neurological: Negative for dizziness, light-headedness and headaches.  Psychiatric/Behavioral: Negative for dysphoric mood. The patient is not nervous/anxious.        Objective:   Vitals:   06/26/17 0819  BP: (!) 150/100  Pulse: 61  Temp: 97.8 F (  36.6 C)  SpO2: 98%   Filed Weights   06/26/17 0819  Weight: 225 lb (102.1 kg)   Body mass index is 32.28 kg/m.  Wt Readings from Last 3 Encounters:  06/26/17 225 lb (102.1 kg)  05/20/17 221 lb (100.2 kg)  04/22/17 222 lb (100.7 kg)     Physical Exam Constitutional: He appears well-developed and well-nourished. No distress.  HENT:  Head: Normocephalic and atraumatic.  Right Ear: External ear normal.  Left Ear: External ear normal.  Mouth/Throat: Oropharynx is clear and moist.  Normal ear canals and TM b/l  Eyes: Conjunctivae and EOM are normal.  Neck: Neck supple. No tracheal deviation present. No thyromegaly present.  No carotid bruit  Cardiovascular: Normal rate, regular rhythm, normal heart sounds and intact distal pulses.   No murmur heard. Pulmonary/Chest: Effort normal and breath sounds normal. No respiratory distress. He has no wheezes. He has no rales.  Abdominal: Soft. He exhibits no distension. There is no tenderness.  Genitourinary: deferred to urology Musculoskeletal: He exhibits no edema.  Lymphadenopathy:   He has no cervical adenopathy.  Skin: Skin is warm and dry. He is not diaphoretic.  Psychiatric: He has a normal mood and affect. His behavior is normal.         Assessment & Plan:   Wellness  Exam: Immunizations  Flu vaccine today, discussed shingrix Colonoscopy    Up to date  Eye exam   Up to date  - due,  will schedule Hearing loss  none Memory concerns/difficulties   none Independent of ADLs   fully Stressed the importance of regular exercise   Patient received copy of preventative screening tests/immunizations recommended for the next 5-10 years.    See Problem List for Assessment and Plan of chronic medical problems.

## 2017-06-25 NOTE — Assessment & Plan Note (Signed)
following with urology

## 2017-06-26 ENCOUNTER — Encounter: Payer: Self-pay | Admitting: Internal Medicine

## 2017-06-26 ENCOUNTER — Ambulatory Visit (INDEPENDENT_AMBULATORY_CARE_PROVIDER_SITE_OTHER): Payer: Medicare Other | Admitting: Internal Medicine

## 2017-06-26 ENCOUNTER — Other Ambulatory Visit (INDEPENDENT_AMBULATORY_CARE_PROVIDER_SITE_OTHER): Payer: Medicare Other

## 2017-06-26 VITALS — BP 150/100 | HR 61 | Temp 97.8°F | Ht 70.0 in | Wt 225.0 lb

## 2017-06-26 DIAGNOSIS — E7849 Other hyperlipidemia: Secondary | ICD-10-CM

## 2017-06-26 DIAGNOSIS — I10 Essential (primary) hypertension: Secondary | ICD-10-CM | POA: Diagnosis not present

## 2017-06-26 DIAGNOSIS — E119 Type 2 diabetes mellitus without complications: Secondary | ICD-10-CM

## 2017-06-26 DIAGNOSIS — Z23 Encounter for immunization: Secondary | ICD-10-CM | POA: Diagnosis not present

## 2017-06-26 DIAGNOSIS — Z8546 Personal history of malignant neoplasm of prostate: Secondary | ICD-10-CM

## 2017-06-26 DIAGNOSIS — Z0000 Encounter for general adult medical examination without abnormal findings: Secondary | ICD-10-CM | POA: Diagnosis not present

## 2017-06-26 DIAGNOSIS — K922 Gastrointestinal hemorrhage, unspecified: Secondary | ICD-10-CM | POA: Diagnosis not present

## 2017-06-26 LAB — CBC WITH DIFFERENTIAL/PLATELET
BASOS ABS: 0 10*3/uL (ref 0.0–0.1)
BASOS PCT: 0.2 % (ref 0.0–3.0)
Eosinophils Absolute: 0.2 10*3/uL (ref 0.0–0.7)
Eosinophils Relative: 2.1 % (ref 0.0–5.0)
HEMATOCRIT: 48 % (ref 39.0–52.0)
Hemoglobin: 16.4 g/dL (ref 13.0–17.0)
LYMPHS PCT: 22.6 % (ref 12.0–46.0)
Lymphs Abs: 1.7 10*3/uL (ref 0.7–4.0)
MCHC: 34 g/dL (ref 30.0–36.0)
MCV: 84.8 fl (ref 78.0–100.0)
Monocytes Absolute: 0.8 10*3/uL (ref 0.1–1.0)
Monocytes Relative: 11.1 % (ref 3.0–12.0)
NEUTROS ABS: 4.7 10*3/uL (ref 1.4–7.7)
NEUTROS PCT: 64 % (ref 43.0–77.0)
PLATELETS: 258 10*3/uL (ref 150.0–400.0)
RBC: 5.67 Mil/uL (ref 4.22–5.81)
RDW: 14.7 % (ref 11.5–15.5)
WBC: 7.3 10*3/uL (ref 4.0–10.5)

## 2017-06-26 LAB — LIPID PANEL
CHOL/HDL RATIO: 4
CHOLESTEROL: 176 mg/dL (ref 0–200)
HDL: 39.2 mg/dL (ref >39.00)
NonHDL: 136.63
Triglycerides: 224 mg/dL — ABNORMAL HIGH (ref 0.0–149.0)
VLDL: 44.8 mg/dL — AB (ref 0.0–40.0)

## 2017-06-26 LAB — COMPREHENSIVE METABOLIC PANEL
ALT: 40 U/L (ref 0–53)
AST: 27 U/L (ref 0–37)
Albumin: 4.2 g/dL (ref 3.5–5.2)
Alkaline Phosphatase: 57 U/L (ref 39–117)
BILIRUBIN TOTAL: 0.8 mg/dL (ref 0.2–1.2)
BUN: 12 mg/dL (ref 6–23)
CALCIUM: 9.1 mg/dL (ref 8.4–10.5)
CHLORIDE: 102 meq/L (ref 96–112)
CO2: 28 meq/L (ref 19–32)
CREATININE: 1.01 mg/dL (ref 0.40–1.50)
GFR: 78.49 mL/min (ref >60.00)
GLUCOSE: 126 mg/dL — AB (ref 70–99)
Potassium: 4.5 mEq/L (ref 3.5–5.1)
SODIUM: 139 meq/L (ref 135–145)
Total Protein: 6.9 g/dL (ref 6.0–8.3)

## 2017-06-26 LAB — HEMOGLOBIN A1C: Hgb A1c MFr Bld: 6.4 % (ref 4.6–6.5)

## 2017-06-26 LAB — LDL CHOLESTEROL, DIRECT: LDL DIRECT: 103 mg/dL

## 2017-06-26 LAB — TSH: TSH: 1.12 u[IU]/mL (ref 0.35–4.50)

## 2017-06-26 NOTE — Assessment & Plan Note (Signed)
No GERD symptoms, h/o GI bleeding Taking protonix daily

## 2017-06-26 NOTE — Assessment & Plan Note (Signed)
Blood pressure elevated here today, but well controlled at home Continue to monitor him closely Continue current medications at current doses CMP, TSH, CBC

## 2017-06-26 NOTE — Assessment & Plan Note (Signed)
Diet controlled Stressed regular exercise Continue diabetic diet Check A1c Advised weight loss

## 2017-06-27 ENCOUNTER — Encounter: Payer: Self-pay | Admitting: Internal Medicine

## 2017-07-02 DIAGNOSIS — L02212 Cutaneous abscess of back [any part, except buttock]: Secondary | ICD-10-CM | POA: Diagnosis not present

## 2017-07-02 DIAGNOSIS — L578 Other skin changes due to chronic exposure to nonionizing radiation: Secondary | ICD-10-CM | POA: Diagnosis not present

## 2017-07-02 DIAGNOSIS — L720 Epidermal cyst: Secondary | ICD-10-CM | POA: Diagnosis not present

## 2017-07-17 DIAGNOSIS — C61 Malignant neoplasm of prostate: Secondary | ICD-10-CM | POA: Diagnosis not present

## 2017-07-20 ENCOUNTER — Other Ambulatory Visit: Payer: Self-pay | Admitting: Family Medicine

## 2017-07-20 NOTE — Telephone Encounter (Signed)
Refill done.  

## 2017-07-23 NOTE — Progress Notes (Signed)
[name] [address] [place], [place] [address] Phone: [phone] Subjective:    I'm seeing this patient by the request  of:    CC: Follow-up elbow pain  [id number]  Johnny [name] is a 66 y.o. male coming in for follow up for elbow pain. He has been doing well. He occasionally feels pain with heavy lifting but over states that his pain has subsided.  Patient states he is a 95% better.  Only some very mild pain with heavy lifting.  Otherwise does not notice it at all.      Past Medical History:  Diagnosis Date  . Hyperlipidemia    LDL goal = < 110  . Hypertension   . Prostate cancer [facility]) 2008   Dr Darcus Austin, [facility]   Past Surgical History:  Procedure Laterality Date  . CERVICAL FUSION  2007   C5-C7 , Dr Saintclair Halsted   . COLONOSCOPY  2006   negative; [place], [place]  . PROSTATECTOMY  11/2007   Dr.Moul, [place]  . WRIST SURGERY  1998   H[place] bone resection 2006   Social History   Socioeconomic History  . Marital status: Married    Spouse name: None  . Number of children: 3  . Years of education: None  . Highest education level: None  Social Needs  . Financial resource strain: None  . Food insecurity - worry: None  . Food insecurity - inability: None  . Transportation needs - medical: None  . Transportation needs - non-medical: None  Occupational History  . Occupation: Teacher/Coach  Tobacco Use  . Smoking status: Never Smoker  . Smokeless tobacco: Never Used  Substance and Sexual Activity  . Alcohol use: No  . Drug use: No  . Sexual activity: None  Other Topics Concern  . None  Social History Narrative  . None   No Known Allergies Family History  Problem Relation Age of Onset  . Breast cancer Mother   . Alzheimer's disease Mother   . Colon cancer Paternal Grandmother   . Diabetes Paternal Grandfather   . Heart attack Paternal Grandfather 53  . Breast cancer Maternal Grandmother   . Heart attack Maternal Grandfather 72  .  Heart disease Father        Atrial fibrillation  . Heart failure Father   . Stroke Neg Hx      Past medical history, social, surgical and family history all reviewed in electronic medical record.  No pertanent information unless stated regarding to the chief complaint.   Review of Systems:Review of systems updated and as accurate as of 07/24/17  No headache, visual changes, nausea, vomiting, diarrhea, constipation, dizziness, abdominal pain, skin rash, fevers, chills, night sweats, weight loss, swollen lymph nodes, body aches, joint swelling, muscle aches, chest pain, shortness of breath, mood changes.   Objective  Blood pressure 138/84, pulse 63, height 5\' 10"  (1.778 m), weight 230 lb (104.3 kg), SpO2 97 %. Systems examined below as of 07/24/17   General: No apparent distress alert and oriented x3 mood and affect normal, dressed appropriately.  HEENT: Pupils equal, extraocular movements intact  Respiratory: Patient's speak in full sentences and does not appear short of breath  Cardiovascular: No lower extremity edema, non tender, no erythema  Skin: Warm dry intact with no signs of infection or rash on extremities or on axial skeleton.  Abdomen: Soft nontender  Neuro: Cranial nerves II through XII are intact, neurovascularly intact in all extremities with 2+ DTRs and  2+ pulses.  Lymph: No lymphadenopathy of posterior or anterior cervical chain or axillae bilaterally.  Gait normal with good balance and coordination.  MSK:  Non tender with full range of motion and good stability and symmetric strength and tone of shoulders, elbows, wrist, hip, knee and ankles bilaterally.     Impression and Recommendations:     This case required medical decision making of moderate complexity.      Note: This dictation was prepared with Dragon dictation along with smaller phrase technology. Any transcriptional errors that result from this process are unintentional.

## 2017-07-24 ENCOUNTER — Ambulatory Visit (INDEPENDENT_AMBULATORY_CARE_PROVIDER_SITE_OTHER): Payer: Medicare Other | Admitting: Family Medicine

## 2017-07-24 ENCOUNTER — Encounter: Payer: Self-pay | Admitting: Family Medicine

## 2017-07-24 DIAGNOSIS — M7521 Bicipital tendinitis, right shoulder: Secondary | ICD-10-CM

## 2017-07-24 NOTE — Patient Instructions (Signed)
Good to see you  You are doing great  Careful with lifting.  If the thumb gets worse we can inject you  Call [phone] See me when you need me

## 2017-07-24 NOTE — Assessment & Plan Note (Signed)
Resolved at this time.  No significant changes otherwise.  Follow-up again as needed

## 2017-08-01 ENCOUNTER — Other Ambulatory Visit: Payer: Self-pay | Admitting: Internal Medicine

## 2017-08-01 DIAGNOSIS — I10 Essential (primary) hypertension: Secondary | ICD-10-CM

## 2017-08-18 ENCOUNTER — Other Ambulatory Visit: Payer: Self-pay | Admitting: Family Medicine

## 2017-09-12 ENCOUNTER — Other Ambulatory Visit: Payer: Self-pay | Admitting: Internal Medicine

## 2017-09-12 DIAGNOSIS — I10 Essential (primary) hypertension: Secondary | ICD-10-CM

## 2017-09-17 DIAGNOSIS — J206 Acute bronchitis due to rhinovirus: Secondary | ICD-10-CM | POA: Diagnosis not present

## 2017-09-17 DIAGNOSIS — J329 Chronic sinusitis, unspecified: Secondary | ICD-10-CM | POA: Diagnosis not present

## 2017-09-18 ENCOUNTER — Ambulatory Visit: Payer: Medicare Other | Admitting: Family

## 2017-10-07 ENCOUNTER — Other Ambulatory Visit: Payer: Self-pay | Admitting: Internal Medicine

## 2017-10-07 DIAGNOSIS — K922 Gastrointestinal hemorrhage, unspecified: Secondary | ICD-10-CM

## 2017-10-08 NOTE — Telephone Encounter (Signed)
RX is asking for DX code. Im not sure what can be used according to pts hx.

## 2017-10-21 ENCOUNTER — Other Ambulatory Visit: Payer: Self-pay | Admitting: Internal Medicine

## 2017-10-21 MED ORDER — OSELTAMIVIR PHOSPHATE 75 MG PO CAPS: 75.0000 mg | ORAL_CAPSULE | Freq: Every day | ORAL | 0 refills | Status: DC

## 2017-11-08 ENCOUNTER — Other Ambulatory Visit: Payer: Self-pay | Admitting: Family Medicine

## 2017-12-09 NOTE — Progress Notes (Signed)
[name] [address] [place], [place] [address] Phone: [phone] Subjective:     CC: Right knee pain  Johnny [name]  Johnny [name] is a 67 y.o. male coming in with complaint of right knee pain. [address] and hit his knee. Believes he fell on a rock.  This was in [place] bed.  Had swelling initially.  And bruising.  Has improved slowly but continues to give him pain on a daily basis.  Onset- Chronic probably multiple months ago. Location- [place] Character-aching sensation Therapies tried-icing occasionally Severity-5 out of 10    Past Medical History:  Diagnosis Date  . Hyperlipidemia    LDL goal = < 110  . Hypertension   . Prostate cancer [facility]) 2008   Dr Darcus Austin, [facility]   Past Surgical History:  Procedure Laterality Date  . CERVICAL FUSION  2007   C5-C7 , Dr Saintclair Halsted   . COLONOSCOPY  2006   negative; [place], [place]  . ESOPHAGOGASTRODUODENOSCOPY Left 10/28/2015   Procedure: ESOPHAGOGASTRODUODENOSCOPY (EGD);  Surgeon: Teena Irani, MD;  Location[facility];  Service: Endoscopy;  Laterality: Left;  . PROSTATECTOMY  11/2007   Dr.Moul, [facility]  . WRIST SURGERY  1998   Hammate bone resection 2006   Social History   Socioeconomic History  . Marital status: Married    Spouse name: Not on file  . Number of children: 3  . Years of education: Not on file  . Highest education level: Not on file  Occupational History  . Occupation: Teacher/Coach  Social Needs  . Financial resource strain: Not on file  . Food insecurity:    Worry: Not on file    Inability: Not on file  . Transportation needs:    Medical: Not on file    Non-medical: Not on file  Tobacco Use  . Smoking status: Never Smoker  . Smokeless tobacco: Never Used  Substance and Sexual Activity  . Alcohol use: No  . Drug use: No  . Sexual activity: Not on file  Lifestyle  . Physical activity:    Days per week: Not on file    Minutes per session: Not on file  . Stress: Not on file    Relationships  . Social connections:    Talks on phone: Not on file    Gets together: Not on file    Attends religious service: Not on file    Active member of club or organization: Not on file    Attends meetings of clubs or organizations: Not on file    Relationship status: Not on file  Other Topics Concern  . Not on file  Social History Narrative  . Not on file   No Known Allergies Family History  Problem Relation Age of Onset  . Breast cancer Mother   . Alzheimer's disease Mother   . Colon cancer Paternal Grandmother   . Diabetes Paternal Grandfather   . Heart attack Paternal Grandfather 49  . Breast cancer Maternal Grandmother   . Heart attack Maternal Grandfather 72  . Heart disease Father        Atrial fibrillation  . Heart failure Father   . Stroke Neg Hx      Past medical history, social, surgical and family history all reviewed in electronic medical record.  No pertanent information unless stated regarding to the chief complaint.   Review of Systems:Review of systems updated and as accurate as of 12/10/17  No headache, visual changes, nausea, vomiting, diarrhea, constipation, dizziness,  abdominal pain, skin rash, fevers, chills, night sweats, weight loss, swollen lymph nodes, body aches, joint swelling, chest pain, shortness of breath, mood changes.  Positive muscle aches  Objective  Blood pressure 128/70, pulse 81, height 5\' 10"  (1.778 m), weight 234 lb (106.1 kg), SpO2 98 %. Systems examined below as of 12/10/17   General: No apparent distress alert and oriented x3 mood and affect normal, dressed appropriately.  HEENT: Pupils equal, extraocular movements intact  Respiratory: Patient's speak in full sentences and does not appear short of breath  Cardiovascular: No lower extremity edema, non tender, no erythema  Skin: Warm dry intact with no signs of infection or rash on extremities or on axial skeleton.  Abdomen: Soft nontender  Neuro: Cranial nerves II  through XII are intact, neurovascularly intact in all extremities with 2+ DTRs and 2+ pulses.  Lymph: No lymphadenopathy of posterior or anterior cervical chain or axillae bilaterally.  Gait normal with good balance and coordination.  MSK:  Non tender with full range of motion and good stability and symmetric strength and tone of shoulders, elbows, wrist, hip and ankles bilaterally.  Knee: Right Mild bruising over the patella Tender to palpation of the patella noted. ROM full in flexion and extension and lower leg rotation. Ligaments with solid consistent endpoints including ACL, PCL, LCL, MCL. Negative Mcmurray's, Apley's, and Thessalonian tests. Mild painful patellar compression. Patellar glide with mild crepitus. Patellar and quadriceps tendons unremarkable. Hamstring and quadriceps strength is normal. Contralateral knee unremarkable  MSK US performed of: Right knee This study was ordered, performed, and interpreted by Charlann Boxer D.O.  Knee: Patient has some mild thickening of the patella but no cortical defect.  Increasing Doppler flow to the bone.  Mild overlying hypoechoic changes.  IMPRESSION: Bone contusion of the knee   Impression and Recommendations:     This case required medical decision making of moderate complexity.      Note: This dictation was prepared with Dragon dictation along with smaller phrase technology. Any transcriptional errors that result from this process are unintentional.

## 2017-12-10 ENCOUNTER — Encounter: Payer: Self-pay | Admitting: Family Medicine

## 2017-12-10 ENCOUNTER — Ambulatory Visit (INDEPENDENT_AMBULATORY_CARE_PROVIDER_SITE_OTHER): Payer: Medicare Other | Admitting: Family Medicine

## 2017-12-10 ENCOUNTER — Ambulatory Visit: Payer: Self-pay

## 2017-12-10 VITALS — BP 128/70 | HR 81 | Ht 70.0 in | Wt 234.0 lb

## 2017-12-10 DIAGNOSIS — M25561 Pain in right knee: Secondary | ICD-10-CM | POA: Diagnosis not present

## 2017-12-10 DIAGNOSIS — G8929 Other chronic pain: Secondary | ICD-10-CM

## 2017-12-10 DIAGNOSIS — S8001XA Contusion of right knee, initial encounter: Secondary | ICD-10-CM | POA: Insufficient documentation

## 2017-12-10 NOTE — Assessment & Plan Note (Signed)
Knee contusion.  Patient does have more of a bruise.  Discussed with patient at great length.  Patient seems to be doing relatively well and on ultrasound do not see any significant bony or cortical defect noted.  Some hypoechoic changes mild increase in Doppler flow but shows likely contusion.  Patient will continue with the vitamin D, discussed over-the-counter medications, discussed avoiding impact in the area.  Follow-up again in 3 weeks if not completely resolved

## 2017-12-10 NOTE — Patient Instructions (Signed)
Good to see you  Johnny [name] is your friend. 20 minutes at night Arnica lotion 2 times a day over the counter May be a little painful another week or 2  If it hangs around for more then that come see me  Otherwise you know where I am if you need me

## 2017-12-24 NOTE — Progress Notes (Signed)
Subjective:    Patient ID: Johnny [name], male    DOB: 24-Nov-1950, 67 y.o.   MRN: [id number]  HPI The patient is here for follow up.  Hypertension: He is taking his medication daily. He is compliant with a low sodium diet.  He denies chest pain, palpitations, edema, shortness of breath and regular headaches. He is exercising irregularly.  He does not monitor his blood pressure at home.    Diabetes: He is taking his medication daily as prescribed. He is fairly compliant with a diabetic diet. He is exercising irregularly. He checks his feet daily and denies foot lesions. He is up-to-date with an ophthalmology examination.   Hyperlipidemia: He is not taking his medication daily. He has never been on medication.  He is compliant with a low fat/cholesterol diet. He is exercising irregularly.   The 10-year ASCVD risk score Mikey Bussing DC Brooke Bonito., et al., 2013) is: 29.1%   Values used to calculate the score:     Age: 4 years     Sex: Male     Is Non-Hispanic African American: No     Diabetic: Yes     Tobacco smoker: No     Systolic Blood Pressure: 818 mmHg     Is BP treated: Yes     HDL Cholesterol: 37.2 mg/dL     Total Cholesterol: 163 mg/dL   History of upper GI bleed: He has never had any GERD.  He takes Protonix daily.  He denies heartburn, stomach pain, blood in the stool or black stool.  Medications and allergies reviewed with patient and updated if appropriate.  Patient Active Problem List   Diagnosis Date Noted  . Contusion of right knee 12/10/2017  . Biceps tendinitis on right 05/20/2017  . Cervical radiculopathy 03/25/2017  . Arthritis of carpometacarpal [facility]) joint of right thumb 12/15/2016  . Subluxation/dislocation ECU (extensor carpi ulnaris) tendon, left, initial encounter 09/25/2016  . Diabetes ([place]) 06/22/2016  . Upper GI bleeding 10/28/2015  . Rosacea 04/16/2015  . Hyperlipidemia 01/09/2009  . PROSTATE CANCER, HX OF 01/09/2009  . Essential hypertension 08/02/2007  .  ABNORMAL ELECTROCARDIOGRAM 08/02/2007    Current Outpatient Medications on File Prior to Visit  Medication Sig Dispense Refill  . b complex vitamins tablet Take 1 tablet by mouth daily.    Marland Kitchen gabapentin (NEURONTIN) 100 MG capsule TAKE 2 CAPSULES BY MOUTH AT BEDTIME. 180 capsule 1  . hydrochlorothiazide (HYDRODIURIL) 25 MG tablet Take 1 tablet (25 mg total) by mouth daily. 90 tablet 3  . losartan (COZAAR) 100 MG tablet TAKE 1 TABLET (100 MG TOTAL) BY MOUTH DAILY. 90 tablet 3  . Omega-3 Fatty Acids (OMEGA 3 PO) Take 1 tablet by mouth daily.     . pantoprazole (PROTONIX) 40 MG tablet TAKE 1 TABLET (40 MG TOTAL) BY MOUTH 2 (TWO) TIMES DAILY. 180 tablet 3  . Vitamin D, Ergocalciferol, (DRISDOL) 50000 units CAPS capsule TAKE 1 CAPSULE (50,000 UNITS TOTAL) BY MOUTH EVERY 7 (SEVEN) DAYS. 12 capsule 0   No current facility-administered medications on file prior to visit.     Past Medical History:  Diagnosis Date  . Hyperlipidemia    LDL goal = < 110  . Hypertension   . Prostate cancer [facility]) 2008   Dr Darcus Austin, [facility]    Past Surgical History:  Procedure Laterality Date  . CERVICAL FUSION  2007   C5-C7 , Dr Saintclair Halsted   . COLONOSCOPY  2006   negative; [place], [place]  . ESOPHAGOGASTRODUODENOSCOPY Left 10/28/2015  Procedure: ESOPHAGOGASTRODUODENOSCOPY (EGD);  Surgeon: Teena Irani, MD;  Location: [facility];  Service: Endoscopy;  Laterality: Left;  . PROSTATECTOMY  11/2007   Dr.Moul, [facility]  . WRIST SURGERY  1998   Hammate bone resection 2006    Social History   Socioeconomic History  . Marital status: Married    Spouse name: Not on file  . Number of children: 3  . Years of education: Not on file  . Highest education level: Not on file  Occupational History  . Occupation: Teacher/Coach  Social Needs  . Financial resource strain: Not on file  . Food insecurity:    Worry: Not on file    Inability: Not on file  . Transportation needs:    Medical: Not on file    Non-medical: Not on file    Tobacco Use  . Smoking status: Never Smoker  . Smokeless tobacco: Never Used  Substance and Sexual Activity  . Alcohol use: No  . Drug use: No  . Sexual activity: Not on file  Lifestyle  . Physical activity:    Days per week: Not on file    Minutes per session: Not on file  . Stress: Not on file  Relationships  . Social connections:    Talks on phone: Not on file    Gets together: Not on file    Attends religious service: Not on file    Active member of club or organization: Not on file    Attends meetings of clubs or organizations: Not on file    Relationship status: Not on file  Other Topics Concern  . Not on file  Social History Narrative  . Not on file    Family History  Problem Relation Age of Onset  . Breast cancer Mother   . Alzheimer's disease Mother   . Colon cancer Paternal Grandmother   . Diabetes Paternal Grandfather   . Heart attack Paternal Grandfather 43  . Breast cancer Maternal Grandmother   . Heart attack Maternal Grandfather 72  . Heart disease Father        Atrial fibrillation  . Heart failure Father   . Stroke Neg Hx     Review of Systems  Constitutional: Negative for chills and fever.  Respiratory: Negative for cough, shortness of breath and wheezing.   Cardiovascular: Negative for chest pain, palpitations and leg swelling.  Neurological: Negative for light-headedness and headaches.       Objective:   Vitals:   12/25/17 0925  BP: 128/80  Pulse: 77  Resp: 16  Temp: 98.2 F (36.8 C)  SpO2: 98%   BP Readings from Last 3 Encounters:  12/25/17 128/80  12/10/17 128/70  07/24/17 138/84   Wt Readings from Last 3 Encounters:  12/25/17 230 lb (104.3 kg)  12/10/17 234 lb (106.1 kg)  07/24/17 230 lb (104.3 kg)   Body mass index is 33 kg/m.   Physical Exam    Constitutional: Appears well-developed and well-nourished. No distress.  HENT:  Head: Normocephalic and atraumatic.  Neck: Neck supple. No tracheal deviation present. No  thyromegaly present.  No cervical lymphadenopathy Cardiovascular: Normal rate, regular rhythm and normal heart sounds.   No murmur heard. No carotid bruit .  No edema Pulmonary/Chest: Effort normal and breath sounds normal. No respiratory distress. No has no wheezes. No rales.  Skin: Skin is warm and dry. Not diaphoretic.  Psychiatric: Normal mood and affect. Behavior is normal.      Assessment & Plan:    See Problem  List for Assessment and Plan of chronic medical problems.

## 2017-12-24 NOTE — Patient Instructions (Addendum)
  Test(s) ordered today. Your results will be released to [facility] (or called to you) after review, usually within 72hours after test completion. If any changes need to be made, you will be notified at that same time.  All other Health Maintenance issues reviewed.   All recommended immunizations and age-appropriate screenings are up-to-date or discussed.  Pneumovax immunizations administered today.   Medications reviewed and updated.  No changes recommended at this time.   Please followup in 6 months

## 2017-12-25 ENCOUNTER — Ambulatory Visit (INDEPENDENT_AMBULATORY_CARE_PROVIDER_SITE_OTHER): Payer: Medicare Other | Admitting: Internal Medicine

## 2017-12-25 ENCOUNTER — Other Ambulatory Visit (INDEPENDENT_AMBULATORY_CARE_PROVIDER_SITE_OTHER): Payer: Medicare Other

## 2017-12-25 ENCOUNTER — Encounter: Payer: Self-pay | Admitting: Internal Medicine

## 2017-12-25 VITALS — BP 128/80 | HR 77 | Temp 98.2°F | Resp 16 | Ht 70.0 in | Wt 230.0 lb

## 2017-12-25 DIAGNOSIS — K922 Gastrointestinal hemorrhage, unspecified: Secondary | ICD-10-CM

## 2017-12-25 DIAGNOSIS — Z23 Encounter for immunization: Secondary | ICD-10-CM | POA: Diagnosis not present

## 2017-12-25 DIAGNOSIS — I10 Essential (primary) hypertension: Secondary | ICD-10-CM | POA: Diagnosis not present

## 2017-12-25 DIAGNOSIS — E119 Type 2 diabetes mellitus without complications: Secondary | ICD-10-CM | POA: Diagnosis not present

## 2017-12-25 DIAGNOSIS — E7849 Other hyperlipidemia: Secondary | ICD-10-CM

## 2017-12-25 LAB — COMPREHENSIVE METABOLIC PANEL
ALBUMIN: 4.1 g/dL (ref 3.5–5.2)
ALT: 64 U/L — AB (ref 0–53)
AST: 39 U/L — AB (ref 0–37)
Alkaline Phosphatase: 58 U/L (ref 39–117)
BILIRUBIN TOTAL: 0.9 mg/dL (ref 0.2–1.2)
BUN: 15 mg/dL (ref 6–23)
CALCIUM: 9.2 mg/dL (ref 8.4–10.5)
CO2: 28 mEq/L (ref 19–32)
CREATININE: 0.99 mg/dL (ref 0.40–1.50)
Chloride: 102 mEq/L (ref 96–112)
GFR: 80.2 mL/min (ref >60.00)
Glucose, Bld: 123 mg/dL — ABNORMAL HIGH (ref 70–99)
Potassium: 3.8 mEq/L (ref 3.5–5.1)
Sodium: 139 mEq/L (ref 135–145)
TOTAL PROTEIN: 6.6 g/dL (ref 6.0–8.3)

## 2017-12-25 LAB — CBC WITH DIFFERENTIAL/PLATELET
Basophils Absolute: 0 10*3/uL (ref 0.0–0.1)
Basophils Relative: 0.2 % (ref 0.0–3.0)
EOS ABS: 0.2 10*3/uL (ref 0.0–0.7)
Eosinophils Relative: 2.9 % (ref 0.0–5.0)
HEMATOCRIT: 41 % (ref 39.0–52.0)
HEMOGLOBIN: 13.8 g/dL (ref 13.0–17.0)
LYMPHS PCT: 23.9 % (ref 12.0–46.0)
Lymphs Abs: 1.6 10*3/uL (ref 0.7–4.0)
MCHC: 33.7 g/dL (ref 30.0–36.0)
MCV: 83.1 fl (ref 78.0–100.0)
Monocytes Absolute: 0.8 10*3/uL (ref 0.1–1.0)
Monocytes Relative: 11.7 % (ref 3.0–12.0)
Neutro Abs: 4 10*3/uL (ref 1.4–7.7)
Neutrophils Relative %: 61.3 % (ref 43.0–77.0)
Platelets: 313 10*3/uL (ref 150.0–400.0)
RBC: 4.94 Mil/uL (ref 4.22–5.81)
RDW: 14.5 % (ref 11.5–15.5)
WBC: 6.5 10*3/uL (ref 4.0–10.5)

## 2017-12-25 LAB — LIPID PANEL
CHOL/HDL RATIO: 4
Cholesterol: 163 mg/dL (ref 0–200)
HDL: 37.2 mg/dL — ABNORMAL LOW (ref >39.00)
LDL Cholesterol: 100 mg/dL — ABNORMAL HIGH (ref 0–99)
NonHDL: 125.94
TRIGLYCERIDES: 128 mg/dL (ref 0.0–149.0)
VLDL: 25.6 mg/dL (ref 0.0–40.0)

## 2017-12-25 LAB — TSH: TSH: 1.02 u[IU]/mL (ref 0.35–4.50)

## 2017-12-25 LAB — HEMOGLOBIN A1C: Hgb A1c MFr Bld: 6.3 % (ref 4.6–6.5)

## 2017-12-25 NOTE — Assessment & Plan Note (Signed)
No GERD No symptoms suggestive of GI bleed Continue pantoprazole twice daily

## 2017-12-25 NOTE — Assessment & Plan Note (Signed)
BP well controlled Current regimen effective and well tolerated Continue current medications at current doses cmp  

## 2017-12-25 NOTE — Assessment & Plan Note (Signed)
Currently diet controlled and LDL is good, but given other medical problems he is still at an very high risk for heart attack and stroke Discussed low-dose statin Will check lipid panel, CMP today and have him think about this, but recommended he take low-dose statin daily

## 2017-12-25 NOTE — Assessment & Plan Note (Signed)
Diet controlled Stressed more regular exercise Ideally work on weight loss Stress diabetic diet Check a1c

## 2017-12-26 ENCOUNTER — Encounter: Payer: Self-pay | Admitting: Internal Medicine

## 2018-01-13 ENCOUNTER — Other Ambulatory Visit: Payer: Self-pay | Admitting: Family Medicine

## 2018-01-27 ENCOUNTER — Other Ambulatory Visit: Payer: Self-pay | Admitting: Family Medicine

## 2018-03-05 DIAGNOSIS — C61 Malignant neoplasm of prostate: Secondary | ICD-10-CM | POA: Diagnosis not present

## 2018-03-05 DIAGNOSIS — N5231 Erectile dysfunction following radical prostatectomy: Secondary | ICD-10-CM | POA: Diagnosis not present

## 2018-03-24 NOTE — Progress Notes (Signed)
[name] [place], [place] [address] Phone: [phone] Subjective:     CC: Right thumb pain  WCB:[id number]  Johnny [name] is a 67 y.o. male coming in with complaint of right thumb pain.  Patient has known [place] arthritis.  Was given injection 15 months ago and responded very well.  Patient states that continues to have some discomfort now.  Over the course the last month worsening again.  Starting to affect daily activities such as gripping.      Past Medical History:  Diagnosis Date  . Hyperlipidemia    LDL goal = < 110  . Hypertension   . Prostate cancer [facility]) 2008   Dr Darcus Austin, [facility]   Past Surgical History:  Procedure Laterality Date  . CERVICAL FUSION  2007   C5-C7 , Dr Saintclair Halsted   . COLONOSCOPY  2006   negative; [place], [place]  . ESOPHAGOGASTRODUODENOSCOPY Left 10/28/2015   Procedure: ESOPHAGOGASTRODUODENOSCOPY (EGD);  Surgeon: Teena Irani, MD;  Location: [facility];  Service: Endoscopy;  Laterality: Left;  . PROSTATECTOMY  11/2007   Dr.Moul, [facility]  . WRIST SURGERY  1998   Hammate bone resection 2006   Social History   Socioeconomic History  . Marital status: Married    Spouse name: Not on file  . Number of children: 3  . Years of education: Not on file  . Highest education level: Not on file  Occupational History  . Occupation: Teacher/Coach  Social Needs  . Financial resource strain: Not on file  . Food insecurity:    Worry: Not on file    Inability: Not on file  . Transportation needs:    Medical: Not on file    Non-medical: Not on file  Tobacco Use  . Smoking status: Never Smoker  . Smokeless tobacco: Never Used  Substance and Sexual Activity  . Alcohol use: No  . Drug use: No  . Sexual activity: Not on file  Lifestyle  . Physical activity:    Days per week: Not on file    Minutes per session: Not on file  . Stress: Not on file  Relationships  . Social connections:    Talks on phone: Not on file    Gets  together: Not on file    Attends religious service: Not on file    Active member of club or organization: Not on file    Attends meetings of clubs or organizations: Not on file    Relationship status: Not on file  Other Topics Concern  . Not on file  Social History Narrative  . Not on file   No Known Allergies Family History  Problem Relation Age of Onset  . Breast cancer Mother   . Alzheimer's disease Mother   . Colon cancer Paternal Grandmother   . Diabetes Paternal Grandfather   . Heart attack Paternal Grandfather 22  . Breast cancer Maternal Grandmother   . Heart attack Maternal Grandfather 72  . Heart disease Father        Atrial fibrillation  . Heart failure Father   . Stroke Neg Hx      Past medical history, social, surgical and family history all reviewed in electronic medical record.  No pertanent information unless stated regarding to the chief complaint.   Review of Systems:Review of systems updated and as accurate as of 03/25/18  No headache, visual changes, nausea, vomiting, diarrhea, constipation, dizziness, abdominal pain, skin rash, fevers, chills, night sweats, weight loss, swollen  lymph nodes, body aches, joint swelling, muscle aches, chest pain, shortness of breath, mood changes.   Objective  Blood pressure 130/80, pulse 63, height 5\' 10"  (1.778 m), weight 230 lb (104.3 kg), SpO2 98 %. Systems examined below as of 03/25/18   General: No apparent distress alert and oriented x3 mood and affect normal, dressed appropriately.  HEENT: Pupils equal, extraocular movements intact  Respiratory: Patient's speak in full sentences and does not appear short of breath  Cardiovascular: No lower extremity edema, non tender, no erythema  Skin: Warm dry intact with no signs of infection or rash on extremities or on axial skeleton.  Abdomen: Soft nontender  Neuro: Cranial nerves II through XII are intact, neurovascularly intact in all extremities with 2+ DTRs and 2+ pulses.    Lymph: No lymphadenopathy of posterior or anterior cervical chain or axillae bilaterally.  Gait normal with good balance and coordination.  MSK:  Non tender with full range of motion and good stability and symmetric strength and tone of shoulders, elbows,  hip, knee and ankles bilaterally.  Mild to moderate arthritic changes of multiple joints Right hand exam shows some mild arthritic changes of multiple joints but has full range of motion.  Positive grind of the [facility] joint with severe tenderness over the area.  Trace effusion Neurovascularly intact distally.  Procedure: Real-time Ultrasound Guided Injection of right CMC joint Device: GE Logiq Q7 Ultrasound guided injection is preferred based studies that show increased duration, increased effect, greater accuracy, decreased procedural pain, increased response rate, and decreased cost with ultrasound guided versus blind injection.  Verbal informed consent obtained.  Time-out conducted.  Noted no overlying erythema, induration, or other signs of local infection.  Skin prepped in a sterile fashion.  Local anesthesia: Topical Ethyl chloride.  With sterile technique and under real time ultrasound guidance: With a 25-gauge 1 inch needle patient was injected with 0.5 cc of 0.5% Marcaine and 0.5 cc of Kenalog 40 mg/mL into the right CMC joint Completed without difficulty  Pain immediately resolved suggesting accurate placement of the medication.  Advised to call if fevers/chills, erythema, induration, drainage, or persistent bleeding.  Images permanently stored and available for review in the ultrasound unit.  Impression: Technically successful ultrasound guided injection.     Impression and Recommendations:     This case required medical decision making of moderate complexity.      Note: This dictation was prepared with Dragon dictation along with smaller phrase technology. Any transcriptional errors that result from this process are  unintentional.

## 2018-03-25 ENCOUNTER — Encounter: Payer: Self-pay | Admitting: Family Medicine

## 2018-03-25 ENCOUNTER — Ambulatory Visit: Payer: Self-pay

## 2018-03-25 ENCOUNTER — Ambulatory Visit (INDEPENDENT_AMBULATORY_CARE_PROVIDER_SITE_OTHER): Payer: Medicare Other | Admitting: Family Medicine

## 2018-03-25 VITALS — BP 130/80 | HR 63 | Ht 70.0 in | Wt 230.0 lb

## 2018-03-25 DIAGNOSIS — H2513 Age-related nuclear cataract, bilateral: Secondary | ICD-10-CM | POA: Diagnosis not present

## 2018-03-25 DIAGNOSIS — M79641 Pain in right hand: Secondary | ICD-10-CM

## 2018-03-25 DIAGNOSIS — M1811 Unilateral primary osteoarthritis of first carpometacarpal joint, right hand: Secondary | ICD-10-CM | POA: Diagnosis not present

## 2018-03-25 DIAGNOSIS — E119 Type 2 diabetes mellitus without complications: Secondary | ICD-10-CM | POA: Diagnosis not present

## 2018-03-25 LAB — HM DIABETES EYE EXAM

## 2018-03-25 NOTE — Patient Instructions (Addendum)
Good ot see you  You know the drill  Ice is your friend We can always consider a brace  You know where I am if you need me

## 2018-03-25 NOTE — Assessment & Plan Note (Signed)
Worsening symptoms repeat injection given today.  Encourage patient to consider possible taping or bracing with repetitive activity.  Topical anti-inflammatories given, icing regimen, discussed worsening symptoms can repeat injections every 3 to 4 months.  Patient has declined formal physical therapy.  Follow-up again in 6 weeks

## 2018-03-31 ENCOUNTER — Encounter: Payer: Self-pay | Admitting: Internal Medicine

## 2018-04-23 ENCOUNTER — Other Ambulatory Visit: Payer: Self-pay | Admitting: Family Medicine

## 2018-05-06 ENCOUNTER — Other Ambulatory Visit: Payer: Self-pay | Admitting: Internal Medicine

## 2018-05-26 ENCOUNTER — Encounter: Payer: Self-pay | Admitting: Family Medicine

## 2018-05-26 ENCOUNTER — Ambulatory Visit (INDEPENDENT_AMBULATORY_CARE_PROVIDER_SITE_OTHER): Payer: Medicare Other | Admitting: Family Medicine

## 2018-05-26 ENCOUNTER — Ambulatory Visit: Payer: Self-pay

## 2018-05-26 VITALS — BP 130/80 | HR 63 | Ht 70.0 in | Wt 231.0 lb

## 2018-05-26 DIAGNOSIS — S63219A Subluxation of metacarpophalangeal joint of unspecified finger, initial encounter: Secondary | ICD-10-CM | POA: Diagnosis not present

## 2018-05-26 DIAGNOSIS — M79642 Pain in left hand: Secondary | ICD-10-CM

## 2018-05-26 NOTE — Assessment & Plan Note (Signed)
Discussed buddy taping, vitamin D and over-the-counter medications.-Icing regimen.  Follow-up again in 3 weeks to make sure improving

## 2018-05-26 NOTE — Progress Notes (Signed)
Johnny [name], [place] [address] Phone: [phone] Subjective:    I Johnny [name] am serving as a Education administrator for Dr. Hulan Saas.   I'm seeing this patient by the request  of:    CC:   [id number]  Johnny [name] is a 67 y.o. male coming in with complaint of left hand pain. Has been playing golf. Was putting up a mailbox when he felt pain. States that he feels something moving. No pop noted. Doesn't remember an injury. No numbness and tingling noted.  Onset- August Location- metacarpal  Duration-  Character- sharp Aggravating factors- Turning a knob Reliving factors-  Therapies tried- Ice, pennsaid Severity-     Past Medical History:  Diagnosis Date  . Hyperlipidemia    LDL goal = < 110  . Hypertension   . Prostate cancer [facility] 2008   Dr Darcus Austin, [facility]   Past Surgical History:  Procedure Laterality Date  . CERVICAL FUSION  2007   C5-C7 , Dr Saintclair Halsted   . COLONOSCOPY  2006   negative; [place], [place]  . ESOPHAGOGASTRODUODENOSCOPY Left 10/28/2015   Procedure: ESOPHAGOGASTRODUODENOSCOPY (EGD);  Surgeon: Teena Irani, MD;  Location: [facility];  Service: Endoscopy;  Laterality: Left;  . PROSTATECTOMY  11/2007   Dr.Moul, [facility]  . WRIST SURGERY  1998   Hammate bone resection 2006   Social History   Socioeconomic History  . Marital status: Married    Spouse name: Not on file  . Number of children: 3  . Years of education: Not on file  . Highest education level: Not on file  Occupational History  . Occupation: Teacher/Coach  Social Needs  . Financial resource strain: Not on file  . Food insecurity:    Worry: Not on file    Inability: Not on file  . Transportation needs:    Medical: Not on file    Non-medical: Not on file  Tobacco Use  . Smoking status: Never Smoker  . Smokeless tobacco: Never Used  Substance and Sexual Activity  . Alcohol use: No  . Drug use: No  . Sexual activity: Not on file  Lifestyle  .  Physical activity:    Days per week: Not on file    Minutes per session: Not on file  . Stress: Not on file  Relationships  . Social connections:    Talks on phone: Not on file    Gets together: Not on file    Attends religious service: Not on file    Active member of club or organization: Not on file    Attends meetings of clubs or organizations: Not on file    Relationship status: Not on file  Other Topics Concern  . Not on file  Social History Narrative  . Not on file   No Known Allergies Family History  Problem Relation Age of Onset  . Breast cancer Mother   . Alzheimer's disease Mother   . Colon cancer Paternal Grandmother   . Diabetes Paternal Grandfather   . Heart attack Paternal Grandfather 30  . Breast cancer Maternal Grandmother   . Heart attack Maternal Grandfather 72  . Heart disease Father        Atrial fibrillation  . Heart failure Father   . Stroke Neg Hx      Current Outpatient Medications (Cardiovascular):  .  hydrochlorothiazide (HYDRODIURIL) 25 MG tablet, TAKE 1 TABLET BY MOUTH EVERY DAY .  losartan (COZAAR) 100 MG tablet, TAKE 1 TABLET (  100 MG TOTAL) BY MOUTH DAILY.     Current Outpatient Medications (Other):  .  b complex vitamins tablet, Take 1 tablet by mouth daily. Marland Kitchen  gabapentin (NEURONTIN) 100 MG capsule, TAKE 2 CAPSULES BY MOUTH EVERY DAY AT BEDTIME .  Omega-3 Fatty Acids (OMEGA 3 PO), Take 1 tablet by mouth daily.  .  pantoprazole (PROTONIX) 40 MG tablet, TAKE 1 TABLET (40 MG TOTAL) BY MOUTH 2 (TWO) TIMES DAILY. Marland Kitchen  Vitamin D, Ergocalciferol, (DRISDOL) 50000 units CAPS capsule, TAKE 1 CAPSULE (50,000 UNITS TOTAL) BY MOUTH EVERY 7 (SEVEN) DAYS.    Past medical history, social, surgical and family history all reviewed in electronic medical record.  No pertanent information unless stated regarding to the chief complaint.   Review of Systems:  No headache, visual changes, nausea, vomiting, diarrhea, constipation, dizziness, abdominal pain,  skin rash, fevers, chills, night sweats, weight loss, swollen lymph nodes, body aches, joint swelling, muscle aches, chest pain, shortness of breath, mood changes.   Objective  There were no vitals taken for this visit. Systems examined below as of    General: No apparent distress alert and oriented x3 mood and affect normal, dressed appropriately.  HEENT: Pupils equal, extraocular movements intact  Respiratory: Patient's speak in full sentences and does not appear short of breath  Cardiovascular: No lower extremity edema, non tender, no erythema  Skin: Warm dry intact with no signs of infection or rash on extremities or on axial skeleton.  Abdomen: Soft nontender  Neuro: Cranial nerves II through XII are intact, neurovascularly intact in all extremities with 2+ DTRs and 2+ pulses.  Lymph: No lymphadenopathy of posterior or anterior cervical chain or axillae bilaterally.  Gait normal with good balance and coordination.  MSK:  Non tender with full range of motion and good stability and symmetric strength and tone of shoulders, elbows, wrist, hip, knee and ankles bilaterally.     Impression and Recommendations:     This case required medical decision making of moderate complexity. The above documentation has been reviewed and is accurate and complete Johnny [name]       Note: This dictation was prepared with Dragon dictation along with smaller phrase technology. Any transcriptional errors that result from this process are unintentional.

## 2018-05-26 NOTE — Progress Notes (Signed)
[name] [address] [place], [place] [address] Phone: [phone] Subjective:    I'm seeing this patient by the request  of:    CC: Hand pain  DJM:[id number]  Johnny [name] is a 67 y.o. male coming in with complaint of left hand pain.  Rates the severity pain is 7 out of 10.  Patient states that he noticed in approximately 2 weeks ago and is gotten worse after trying to put in a mailbox.  States that it seems to be the third finger, dorsal aspect.     Past Medical History:  Diagnosis Date  . Hyperlipidemia    LDL goal = < 110  . Hypertension   . Prostate cancer [facility]) 2008   Dr Darcus Austin, [facility]   Past Surgical History:  Procedure Laterality Date  . CERVICAL FUSION  2007   C5-C7 , Dr Saintclair Halsted   . COLONOSCOPY  2006   negative; [place], [place]  . ESOPHAGOGASTRODUODENOSCOPY Left 10/28/2015   Procedure: ESOPHAGOGASTRODUODENOSCOPY (EGD);  Surgeon: Teena Irani, MD;  Location: [facility];  Service: Endoscopy;  Laterality: Left;  . PROSTATECTOMY  11/2007   Dr.Moul, [facility]  . WRIST SURGERY  1998   Hammate bone resection 2006   Social History   Socioeconomic History  . Marital status: Married    Spouse name: Not on file  . Number of children: 3  . Years of education: Not on file  . Highest education level: Not on file  Occupational History  . Occupation: Teacher/Coach  Social Needs  . Financial resource strain: Not on file  . Food insecurity:    Worry: Not on file    Inability: Not on file  . Transportation needs:    Medical: Not on file    Non-medical: Not on file  Tobacco Use  . Smoking status: Never Smoker  . Smokeless tobacco: Never Used  Substance and Sexual Activity  . Alcohol use: No  . Drug use: No  . Sexual activity: Not on file  Lifestyle  . Physical activity:    Days per week: Not on file    Minutes per session: Not on file  . Stress: Not on file  Relationships  . Social connections:    Talks on phone: Not on file    Gets  together: Not on file    Attends religious service: Not on file    Active member of club or organization: Not on file    Attends meetings of clubs or organizations: Not on file    Relationship status: Not on file  Other Topics Concern  . Not on file  Social History Narrative  . Not on file   No Known Allergies Family History  Problem Relation Age of Onset  . Breast cancer Mother   . Alzheimer's disease Mother   . Colon cancer Paternal Grandmother   . Diabetes Paternal Grandfather   . Heart attack Paternal Grandfather 33  . Breast cancer Maternal Grandmother   . Heart attack Maternal Grandfather 72  . Heart disease Father        Atrial fibrillation  . Heart failure Father   . Stroke Neg Hx      Current Outpatient Medications (Cardiovascular):  .  hydrochlorothiazide (HYDRODIURIL) 25 MG tablet, TAKE 1 TABLET BY MOUTH EVERY DAY .  losartan (COZAAR) 100 MG tablet, TAKE 1 TABLET (100 MG TOTAL) BY MOUTH DAILY.     Current Outpatient Medications (Other):  .  b complex vitamins tablet, Take 1  tablet by mouth daily. Marland Kitchen  gabapentin (NEURONTIN) 100 MG capsule, TAKE 2 CAPSULES BY MOUTH EVERY DAY AT BEDTIME .  Omega-3 Fatty Acids (OMEGA 3 PO), Take 1 tablet by mouth daily.  .  pantoprazole (PROTONIX) 40 MG tablet, TAKE 1 TABLET (40 MG TOTAL) BY MOUTH 2 (TWO) TIMES DAILY. Marland Kitchen  Vitamin D, Ergocalciferol, (DRISDOL) 50000 units CAPS capsule, TAKE 1 CAPSULE (50,000 UNITS TOTAL) BY MOUTH EVERY 7 (SEVEN) DAYS.    Past medical history, social, surgical and family history all reviewed in electronic medical record.  No pertanent information unless stated regarding to the chief complaint.   Review of Systems:  No headache, visual changes, nausea, vomiting, diarrhea, constipation, dizziness, abdominal pain, skin rash, fevers, chills, night sweats, weight loss, swollen lymph nodes, body aches, joint swelling, muscle aches, chest pain, shortness of breath, mood changes.   Objective  Blood pressure  130/80, pulse 63, height 5\' 10"  (1.778 m), weight 231 lb (104.8 kg), SpO2 98 %.   General: No apparent distress alert and oriented x3 mood and affect normal, dressed appropriately.  HEENT: Pupils equal, extraocular movements intact  Respiratory: Patient's speak in full sentences and does not appear short of breath  Cardiovascular: No lower extremity edema, non tender, no erythema  Skin: Warm dry intact with no signs of infection or rash on extremities or on axial skeleton.  Abdomen: Soft nontender  Neuro: Cranial nerves II through XII are intact, neurovascularly intact in all extremities with 2+ DTRs and 2+ pulses.  Lymph: No lymphadenopathy of posterior or anterior cervical chain or axillae bilaterally.  Gait normal with good balance and coordination.  MSK:  Non tender with full range of motion and good stability and symmetric strength and tone of shoulders, elbows, wrist, hip, knee and ankles bilaterally.  Mild to moderate arthritic changes of multiple joints  Left hand exam shows the patient does have some swelling between the second and third metacarpal on the dorsal aspect.  MP joint.  Tenderness to palpation in this area more on the second.  Limited musculoskeletal ultrasound was performed and interpreted by Lyndal Pulley  Patient second MCP shows the patient does have a Sperry small avulsion fracture noted.  Patient does have hypoechoic changes and swelling of the soft tissue surrounding the area with increasing Doppler flow.  Impression: Second MCP fracture    Impression and Recommendations:     This case required medical decision making of moderate complexity. The above documentation has been reviewed and is accurate and complete Lyndal Pulley, DO       Note: This dictation was prepared with Dragon dictation along with smaller phrase technology. Any transcriptional errors that result from this process are unintentional.

## 2018-05-26 NOTE — Patient Instructions (Addendum)
Good to see you  Ice is your friend Wrap the finger next to the other finger daily  Try to change your grip if playing golf Ice 20 minutes 2 times daily. Usually after activity and before bed. K2 over the counter daily for 1 month only to aid in the bone healing.  See me again in 3 weeks to make sure healing

## 2018-06-15 NOTE — Progress Notes (Signed)
[name] [address], [place] [address] Phone: [phone] Subjective:   Johnny [name], am serving as a scribe for Dr. Hulan [name].   CC: Left hand pain  Johnny [name]:[id number]  Johnny [name] is a 67 y.o. male coming in with complaint of left hand pain. Has had improvement. Does use buddy tape daily. This helps alleviate his pain. If he takes tape off his pain increases. Has golfed once since we saw him without pain.        Past Medical History:  Diagnosis Date  . Hyperlipidemia    LDL goal = < 110  . Hypertension   . Prostate cancer [name]) 2008   Dr [name], [name]   Past Surgical History:  Procedure Laterality Date  . CERVICAL FUSION  2007   C5-C7 , Dr Johnny [name]   . COLONOSCOPY  2006   negative; [place], [place]  . ESOPHAGOGASTRODUODENOSCOPY Left 10/28/2015   Procedure: ESOPHAGOGASTRODUODENOSCOPY (EGD);  Surgeon: Johnny Irani, MD;  [name];  Service: Endoscopy;  Laterality: Left;  . PROSTATECTOMY  11/2007   Dr.Moul, [name]  . WRIST SURGERY  1998   [place] bone resection 2006   Social History   Socioeconomic History  . Marital status: Married    Spouse name: Not on file  . Number of children: 3  . Years of education: Not on file  . Highest education level: Not on file  Occupational History  . Occupation: Teacher/Coach  Social Needs  . Financial resource strain: Not on file  . Food insecurity:    Worry: Not on file    Inability: Not on file  . Transportation needs:    Medical: Not on file    Non-medical: Not on file  Tobacco Use  . Smoking status: Never Smoker  . Smokeless tobacco: Never Used  Substance and Sexual Activity  . Alcohol use: [name]  . Drug use: [name]  . Sexual activity: Not on file  Lifestyle  . Physical activity:    Days per week: Not on file    Minutes per session: Not on file  . Stress: Not on file  Relationships  . Social connections:    Talks on phone: Not on file    Gets together: Not on file   Attends religious service: Not on file    Active member of club or organization: Not on file    Attends meetings of clubs or organizations: Not on file    Relationship status: Not on file  Other Topics Concern  . Not on file  Social History Narrative  . Not on file   [name] Known Allergies Family History  Problem Relation Age of Onset  . Breast cancer Mother   . Alzheimer's disease Mother   . Colon cancer Paternal Grandmother   . Diabetes Paternal Grandfather   . Heart attack Paternal Grandfather 66  . Breast cancer Maternal Grandmother   . Heart attack Maternal Grandfather 72  . Heart disease Father        Atrial fibrillation  . Heart failure Father   . Stroke Neg Hx      Current Outpatient Medications (Cardiovascular):  .  hydrochlorothiazide (HYDRODIURIL) 25 MG tablet, TAKE 1 TABLET BY MOUTH EVERY DAY .  losartan (COZAAR) 100 MG tablet, TAKE 1 TABLET (100 MG TOTAL) BY MOUTH DAILY.     Current Outpatient Medications (Other):  .  b complex vitamins tablet, Take 1 tablet by mouth daily. Marland Kitchen  gabapentin (NEURONTIN) 100 MG capsule,  TAKE 2 CAPSULES BY MOUTH EVERY DAY AT BEDTIME .  Omega-3 Fatty Acids (OMEGA 3 PO), Take 1 tablet by mouth daily.  .  pantoprazole (PROTONIX) 40 MG tablet, TAKE 1 TABLET (40 MG TOTAL) BY MOUTH 2 (TWO) TIMES DAILY. Marland Kitchen  Vitamin D, Ergocalciferol, (DRISDOL) 50000 units CAPS capsule, TAKE 1 CAPSULE (50,000 UNITS TOTAL) BY MOUTH EVERY 7 (SEVEN) DAYS. Marland Kitchen  vitamin k 100 MCG tablet, Take 100 mcg by mouth daily.    Past medical history, social, surgical and family history all reviewed in electronic medical record.  [name] pertanent information unless stated regarding to the chief complaint.   Review of Systems:  [name] headache, visual changes, nausea, vomiting, diarrhea, constipation, dizziness, abdominal pain, skin rash, fevers, chills, night sweats, weight loss, swollen lymph nodes, body aches, joint swelling, muscle aches, chest pain, shortness of breath, mood  changes.   Objective  Blood pressure 130/76, pulse 88, height 5\' 10"  (1.778 m), weight 231 lb (104.8 kg), SpO2 98 %.    General: [name] apparent distress alert and oriented x3 mood and affect normal, dressed appropriately.  HEENT: Pupils equal, extraocular movements intact  Respiratory: Patient's speak in full sentences and does not appear short of breath  Cardiovascular: [name] lower extremity edema, non tender, [name] erythema  Skin: Warm dry intact with [name] signs of infection or rash on extremities or on axial skeleton.  Abdomen: Soft nontender  Neuro: Cranial nerves II through XII are intact, neurovascularly intact in all extremities with 2+ DTRs and 2+ pulses.  Lymph: [name] lymphadenopathy of posterior or anterior cervical chain or axillae bilaterally.  Gait normal with good balance and coordination.  MSK:  Non tender with full range of motion and good stability and symmetric strength and tone of shoulders, elbows, wrist, hip, knee and ankles bilaterally.  Left hand exam shows the patient does have some swelling over the MCP joint of the second finger.  Patient is still moderately tender in the area.  Improved range of motion.  Limited musculoskeletal ultrasound was performed and interpreted by Johnny [name]  Limited ultrasound of patient's second MCP joint shows that patient does have a callus formation but still not full.  Patient does have some hard callus but still does not have any soft callus between the second and third finger.  Mild increase in vascularity. Impression: Interval healing    Impression and Recommendations:     This case required medical decision making of moderate complexity. The above documentation has been reviewed and is accurate and complete Johnny Pulley, DO       Note: This dictation was prepared with Dragon dictation along with smaller phrase technology. Any transcriptional errors that result from this process are unintentional.

## 2018-06-16 ENCOUNTER — Ambulatory Visit: Payer: Self-pay

## 2018-06-16 ENCOUNTER — Ambulatory Visit (INDEPENDENT_AMBULATORY_CARE_PROVIDER_SITE_OTHER): Payer: Medicare Other | Admitting: Family Medicine

## 2018-06-16 VITALS — BP 130/76 | HR 88 | Ht 70.0 in | Wt 231.0 lb

## 2018-06-16 DIAGNOSIS — S63219A Subluxation of metacarpophalangeal joint of unspecified finger, initial encounter: Secondary | ICD-10-CM | POA: Diagnosis not present

## 2018-06-16 DIAGNOSIS — M79642 Pain in left hand: Secondary | ICD-10-CM | POA: Diagnosis not present

## 2018-06-16 NOTE — Patient Instructions (Signed)
Healing fine  Ice is your friend Will take a little longer See em again in 3-4 weeks to make sure you are doing well

## 2018-06-16 NOTE — Assessment & Plan Note (Signed)
Still having difficulty.  We discussed buddy taping.  Callus formation is noted.  Continue vitamin D and K2.  Follow-up again in 3 to 4 weeks

## 2018-06-17 NOTE — Patient Instructions (Addendum)
  Tests ordered today. Your results will be released to MyChart (or called to you) after review, usually within 72hours after test completion. If any changes need to be made, you will be notified at that same time.  Flu immunization administered today.    Medications reviewed and updated.  Changes include :   none    Please followup in 6 months   

## 2018-06-17 NOTE — Progress Notes (Signed)
Subjective:    Patient ID: Johnny [name], male    DOB: [birth year], 67 y.o.   MRN: [id number]  HPI The patient is here for follow up.  Cold symptoms:  It started two weeks ago.  He has a minimally productive cough, some wheeze, nasal congestion.  He has taken mucinex.  He thinks his symptoms are improving.  Diabetes: He is taking his medication daily as prescribed. He is compliant with a diabetic diet. He is exercising irregularly. Marland Kitchen He checks his feet daily and denies foot lesions. He is up-to-date with an ophthalmology examination.   Hypertension: He is taking his medication daily. He is compliant with a low sodium diet.  He denies chest pain, palpitations, edema, shortness of breath and regular headaches. He is exercising irregularly.  He does not monitor his blood pressure at home.    Hyperlipidemia: He is taking his medication daily. He is compliant with a low fat/cholesterol diet. He is exercising irregularly. He denies myalgias.   H/o GI bleed, no h/o GERD:  He is taking protonix daily. He denies gerd symptoms.  Medications and allergies reviewed with patient and updated if appropriate.  Patient Active Problem List   Diagnosis Date Noted  . MCP subluxation, initial encounter 05/26/2018  . Contusion of right knee 12/10/2017  . Biceps tendinitis on right 05/20/2017  . Cervical radiculopathy 03/25/2017  . Arthritis of carpometacarpal [facility]) joint of right thumb 12/15/2016  . Subluxation/dislocation ECU (extensor carpi ulnaris) tendon, left, initial encounter 09/25/2016  . Diabetes ([place]) 06/22/2016  . Upper GI bleeding 10/28/2015  . Rosacea 04/16/2015  . Hyperlipidemia 01/09/2009  . PROSTATE CANCER, HX OF 01/09/2009  . Essential hypertension 08/02/2007  . ABNORMAL ELECTROCARDIOGRAM 08/02/2007    Current Outpatient Medications on File Prior to Visit  Medication Sig Dispense Refill  . b complex vitamins tablet Take 1 tablet by mouth daily.    Marland Kitchen gabapentin (NEURONTIN) 100 MG  capsule TAKE 2 CAPSULES BY MOUTH EVERY DAY AT BEDTIME 180 capsule 1  . hydrochlorothiazide (HYDRODIURIL) 25 MG tablet TAKE 1 TABLET BY MOUTH EVERY DAY 90 tablet 0  . losartan (COZAAR) 100 MG tablet TAKE 1 TABLET (100 MG TOTAL) BY MOUTH DAILY. 90 tablet 3  . Omega-3 Fatty Acids (OMEGA 3 PO) Take 1 tablet by mouth daily.     . pantoprazole (PROTONIX) 40 MG tablet TAKE 1 TABLET (40 MG TOTAL) BY MOUTH 2 (TWO) TIMES DAILY. 180 tablet 3  . Vitamin D, Ergocalciferol, (DRISDOL) 50000 units CAPS capsule TAKE 1 CAPSULE (50,000 UNITS TOTAL) BY MOUTH EVERY 7 (SEVEN) DAYS. 12 capsule 0  . vitamin k 100 MCG tablet Take 100 mcg by mouth daily.     No current facility-administered medications on file prior to visit.     Past Medical History:  Diagnosis Date  . Hyperlipidemia    LDL goal = < 110  . Hypertension   . Prostate cancer [facility]) 2008   Dr Darcus Austin, [facility]    Past Surgical History:  Procedure Laterality Date  . CERVICAL FUSION  2007   C5-C7 , Dr Saintclair Halsted   . COLONOSCOPY  2006   negative; [place], [place]  . ESOPHAGOGASTRODUODENOSCOPY Left 10/28/2015   Procedure: ESOPHAGOGASTRODUODENOSCOPY (EGD);  Surgeon: Teena Irani, MD;  Location: [facility];  Service: Endoscopy;  Laterality: Left;  . PROSTATECTOMY  11/2007   Dr.Moul, [facility]  . WRIST SURGERY  1998   [place] bone resection 2006    Social History   Socioeconomic History  . Marital status: Married  Spouse name: Not on file  . Number of children: 3  . Years of education: Not on file  . Highest education level: Not on file  Occupational History  . Occupation: Teacher/Coach  Social Needs  . Financial resource strain: Not on file  . Food insecurity:    Worry: Not on file    Inability: Not on file  . Transportation needs:    Medical: Not on file    Non-medical: Not on file  Tobacco Use  . Smoking status: Never Smoker  . Smokeless tobacco: Never Used  Substance and Sexual Activity  . Alcohol use: No  . Drug use: No  . Sexual activity: Not  on file  Lifestyle  . Physical activity:    Days per week: Not on file    Minutes per session: Not on file  . Stress: Not on file  Relationships  . Social connections:    Talks on phone: Not on file    Gets together: Not on file    Attends religious service: Not on file    Active member of club or organization: Not on file    Attends meetings of clubs or organizations: Not on file    Relationship status: Not on file  Other Topics Concern  . Not on file  Social History Narrative  . Not on file    Family History  Problem Relation Age of Onset  . Breast cancer Mother   . Alzheimer's disease Mother   . Colon cancer Paternal Grandmother   . Diabetes Paternal Grandfather   . Heart attack Paternal Grandfather 75  . Breast cancer Maternal Grandmother   . Heart attack Maternal Grandfather 72  . Heart disease Father        Atrial fibrillation  . Heart failure Father   . Stroke Neg Hx     Review of Systems  Constitutional: Negative for chills and fever.  HENT: Positive for congestion (mild). Negative for ear pain, postnasal drip, sinus pressure, sinus pain and sore throat.   Respiratory: Positive for cough (minimal sputum) and wheezing. Negative for shortness of breath.   Cardiovascular: Positive for leg swelling (minimal). Negative for chest pain and palpitations.  Neurological: Negative for light-headedness and headaches.       Objective:   Vitals:   06/18/18 0943  BP: 126/70  Pulse: 61  Resp: 16  Temp: 98.8 F (37.1 C)  SpO2: 98%   BP Readings from Last 3 Encounters:  06/18/18 126/70  06/16/18 130/76  05/26/18 130/80   Wt Readings from Last 3 Encounters:  06/18/18 232 lb (105.2 kg)  06/16/18 231 lb (104.8 kg)  05/26/18 231 lb (104.8 kg)   Body mass index is 33.29 kg/m.   Physical Exam    Constitutional: Appears well-developed and well-nourished. No distress.  HENT:  Head: Normocephalic and atraumatic.  Neck: Bilateral ear canals and tympanic membranes  normal, oropharynx without erythema, neck supple. No tracheal deviation present. No thyromegaly present.  No cervical lymphadenopathy Cardiovascular: Normal rate, regular rhythm and normal heart sounds.   No murmur heard. No carotid bruit .  Trace RLE > LLE edema Pulmonary/Chest: Effort normal and breath sounds normal. No respiratory distress. No has no wheezes. No rales.  Skin: Skin is warm and dry. Not diaphoretic.  Psychiatric: Normal mood and affect. Behavior is normal.      Assessment & Plan:    See Problem List for Assessment and Plan of chronic medical problems.

## 2018-06-18 ENCOUNTER — Other Ambulatory Visit (INDEPENDENT_AMBULATORY_CARE_PROVIDER_SITE_OTHER): Payer: Medicare Other

## 2018-06-18 ENCOUNTER — Ambulatory Visit (INDEPENDENT_AMBULATORY_CARE_PROVIDER_SITE_OTHER): Payer: Medicare Other | Admitting: Internal Medicine

## 2018-06-18 ENCOUNTER — Encounter: Payer: Self-pay | Admitting: Internal Medicine

## 2018-06-18 VITALS — BP 126/70 | HR 61 | Temp 98.8°F | Resp 16 | Ht 70.0 in | Wt 232.0 lb

## 2018-06-18 DIAGNOSIS — E7849 Other hyperlipidemia: Secondary | ICD-10-CM

## 2018-06-18 DIAGNOSIS — J069 Acute upper respiratory infection, unspecified: Secondary | ICD-10-CM | POA: Diagnosis not present

## 2018-06-18 DIAGNOSIS — E119 Type 2 diabetes mellitus without complications: Secondary | ICD-10-CM

## 2018-06-18 DIAGNOSIS — Z23 Encounter for immunization: Secondary | ICD-10-CM

## 2018-06-18 DIAGNOSIS — I10 Essential (primary) hypertension: Secondary | ICD-10-CM | POA: Diagnosis not present

## 2018-06-18 DIAGNOSIS — K922 Gastrointestinal hemorrhage, unspecified: Secondary | ICD-10-CM

## 2018-06-18 LAB — COMPREHENSIVE METABOLIC PANEL
ALT: 92 U/L — AB (ref 0–53)
AST: 49 U/L — ABNORMAL HIGH (ref 0–37)
Albumin: 4 g/dL (ref 3.5–5.2)
Alkaline Phosphatase: 55 U/L (ref 39–117)
BILIRUBIN TOTAL: 0.6 mg/dL (ref 0.2–1.2)
BUN: 13 mg/dL (ref 6–23)
CO2: 30 mEq/L (ref 19–32)
Calcium: 9.4 mg/dL (ref 8.4–10.5)
Chloride: 103 mEq/L (ref 96–112)
Creatinine, Ser: 0.96 mg/dL (ref 0.40–1.50)
GFR: 82.98 mL/min (ref >60.00)
Glucose, Bld: 150 mg/dL — ABNORMAL HIGH (ref 70–99)
POTASSIUM: 4.2 meq/L (ref 3.5–5.1)
Sodium: 139 mEq/L (ref 135–145)
TOTAL PROTEIN: 6.8 g/dL (ref 6.0–8.3)

## 2018-06-18 LAB — LIPID PANEL
CHOLESTEROL: 157 mg/dL (ref 0–200)
HDL: 34.1 mg/dL — ABNORMAL LOW (ref >39.00)
LDL CALC: 90 mg/dL (ref 0–99)
NonHDL: 123.01
Total CHOL/HDL Ratio: 5
Triglycerides: 166 mg/dL — ABNORMAL HIGH (ref 0.0–149.0)
VLDL: 33.2 mg/dL (ref 0.0–40.0)

## 2018-06-18 LAB — HEMOGLOBIN A1C: Hgb A1c MFr Bld: 7 % — ABNORMAL HIGH (ref 4.6–6.5)

## 2018-06-18 NOTE — Assessment & Plan Note (Signed)
BP well controlled Current regimen effective and well tolerated Continue current medications at current doses cmp  

## 2018-06-18 NOTE — Assessment & Plan Note (Signed)
Symptoms likely viral in nature and are improving Continue Mucinex He will call if the symptoms do not continue to improve and resolve

## 2018-06-18 NOTE — Assessment & Plan Note (Signed)
Diet controlled Check A1c Encourage more regular exercise Diabetic diet Weight loss Follow-up in 6 months

## 2018-06-18 NOTE — Assessment & Plan Note (Signed)
No history of GERD, but history of upper GI bleed Doing well on pantoprazole twice daily-we will continue

## 2018-06-18 NOTE — Assessment & Plan Note (Signed)
Not on any medication Check lipid panel, CMP Encourage more regular exercise and work on weight loss

## 2018-06-20 ENCOUNTER — Encounter: Payer: Self-pay | Admitting: Internal Medicine

## 2018-07-06 NOTE — Progress Notes (Signed)
[name] [address] [place], [place] [address] Phone: [phone] Subjective:   [name], am serving as a scribe for Dr. Hulan Saas.   CC: left hand pain   PPJ:[id number]  JEORGE Johnny [name] is a 67 y.o. male coming in with complaint of left hand pain. He does feel like he has improved. Has been buddy taping his hand since last visit. He has played golf with the tape on. He said that he does have pain if he isn't using the tape.       Past Medical History:  Diagnosis Date  . Hyperlipidemia    LDL goal = < 110  . Hypertension   . Prostate cancer [facility]) 2008   Dr Darcus Austin, [facility]   Past Surgical History:  Procedure Laterality Date  . CERVICAL FUSION  2007   C5-C7 , Dr Saintclair Halsted   . COLONOSCOPY  2006   negative; [place], [place]  . ESOPHAGOGASTRODUODENOSCOPY Left 10/28/2015   Procedure: ESOPHAGOGASTRODUODENOSCOPY (EGD);  Surgeon: Teena Irani, MD;  Location: [facility];  Service: Endoscopy;  Laterality: Left;  . PROSTATECTOMY  11/2007   Dr.Moul, [facility]  . WRIST SURGERY  1998   Hammate bone resection 2006   Social History   Socioeconomic History  . Marital status: Married    Spouse name: Not on file  . Number of children: 3  . Years of education: Not on file  . Highest education level: Not on file  Occupational History  . Occupation: Teacher/Coach  Social Needs  . Financial resource strain: Not on file  . Food insecurity:    Worry: Not on file    Inability: Not on file  . Transportation needs:    Medical: Not on file    Non-medical: Not on file  Tobacco Use  . Smoking status: Never Smoker  . Smokeless tobacco: Never Used  Substance and Sexual Activity  . Alcohol use: No  . Drug use: No  . Sexual activity: Not on file  Lifestyle  . Physical activity:    Days per week: Not on file    Minutes per session: Not on file  . Stress: Not on file  Relationships  . Social connections:    Talks on phone: Not on file    Gets together: Not on  file    Attends religious service: Not on file    Active member of club or organization: Not on file    Attends meetings of clubs or organizations: Not on file    Relationship status: Not on file  Other Topics Concern  . Not on file  Social History Narrative  . Not on file   No Known Allergies Family History  Problem Relation Age of Onset  . Breast cancer Mother   . Alzheimer's disease Mother   . Colon cancer Paternal Grandmother   . Diabetes Paternal Grandfather   . Heart attack Paternal Grandfather 37  . Breast cancer Maternal Grandmother   . Heart attack Maternal Grandfather 72  . Heart disease Father        Atrial fibrillation  . Heart failure Father   . Stroke Neg Hx      Current Outpatient Medications (Cardiovascular):  .  hydrochlorothiazide (HYDRODIURIL) 25 MG tablet, TAKE 1 TABLET BY MOUTH EVERY DAY .  losartan (COZAAR) 100 MG tablet, TAKE 1 TABLET (100 MG TOTAL) BY MOUTH DAILY.     Current Outpatient Medications (Other):  .  b complex vitamins tablet, Take 1 tablet by  mouth daily. Marland Kitchen  gabapentin (NEURONTIN) 100 MG capsule, TAKE 2 CAPSULES BY MOUTH EVERY DAY AT BEDTIME .  Omega-3 Fatty Acids (OMEGA 3 PO), Take 1 tablet by mouth daily.  .  pantoprazole (PROTONIX) 40 MG tablet, TAKE 1 TABLET (40 MG TOTAL) BY MOUTH 2 (TWO) TIMES DAILY. Marland Kitchen  Vitamin D, Ergocalciferol, (DRISDOL) 50000 units CAPS capsule, TAKE 1 CAPSULE (50,000 UNITS TOTAL) BY MOUTH EVERY 7 (SEVEN) DAYS. Marland Kitchen  vitamin k 100 MCG tablet, Take 100 mcg by mouth daily.    Past medical history, social, surgical and family history all reviewed in electronic medical record.  No pertanent information unless stated regarding to the chief complaint.   Review of Systems:  No headache, visual changes, nausea, vomiting, diarrhea, constipation, dizziness, abdominal pain, skin rash, fevers, chills, night sweats, weight loss, swollen lymph nodes, body aches, joint swelling, , chest pain, shortness of breath, mood changes.  Muscle aches   Objective  Blood pressure 132/86, pulse 79, height 5\' 10"  (1.778 m), weight 232 lb (105.2 kg), SpO2 98 %.    General: No apparent distress alert and oriented x3 mood and affect normal, dressed appropriately.  HEENT: Pupils equal, extraocular movements intact  Respiratory: Patient's speak in full sentences and does not appear short of breath  Cardiovascular: No lower extremity edema, non tender, no erythema  Skin: Warm dry intact with no signs of infection or rash on extremities or on axial skeleton.  Abdomen: Soft nontender  Neuro: Cranial nerves II through XII are intact, neurovascularly intact in all extremities with 2+ DTRs and 2+ pulses.  Lymph: No lymphadenopathy of posterior or anterior cervical chain or axillae bilaterally.  Gait mild antalgic  MSK:  Non tender with full range of motion and good stability and symmetric strength and tone of shoulders, elbows, wrist, hip, knee and ankles bilaterally. Arthritic changes noted.  Neck: Inspection loss of lordosis . No palpable stepoffs. Negative Spurling's maneuver. Loss of range of motion in all planes a little  Grip strength and sensation normal in bilateral hands Strength good C4 to T1 distribution No sensory change to C4 to T1 Negative Hoffman sign bilaterally Reflexes normal  Hand exam shows no angulation mild TT P ovker the 3rd MCP  CMCa rthritis also noted.   Limited musculoskeletal ultrasound was performed and interpreted by Lyndal Pulley  Limited ultrasound of the third MCP shows the patient did have mild cortical defect still noted.  85% healed from previous exam.       Impression and Recommendations:     This case required medical decision making of moderate complexity. The above documentation has been reviewed and is accurate and complete Lyndal Pulley, DO       Note: This dictation was prepared with Dragon dictation along with smaller phrase technology. Any transcriptional errors that  result from this process are unintentional.

## 2018-07-07 ENCOUNTER — Encounter: Payer: Self-pay | Admitting: Family Medicine

## 2018-07-07 ENCOUNTER — Ambulatory Visit (INDEPENDENT_AMBULATORY_CARE_PROVIDER_SITE_OTHER): Payer: Medicare Other | Admitting: Family Medicine

## 2018-07-07 ENCOUNTER — Ambulatory Visit: Payer: Self-pay

## 2018-07-07 VITALS — BP 132/86 | HR 79 | Ht 70.0 in | Wt 232.0 lb

## 2018-07-07 DIAGNOSIS — S63219A Subluxation of metacarpophalangeal joint of unspecified finger, initial encounter: Secondary | ICD-10-CM | POA: Diagnosis not present

## 2018-07-07 DIAGNOSIS — M79642 Pain in left hand: Secondary | ICD-10-CM

## 2018-07-07 NOTE — Assessment & Plan Note (Signed)
Improved as well.  Discussed taping.  Discussed what activities  Still taping  RTC in 4 weeks

## 2018-07-07 NOTE — Patient Instructions (Signed)
Goodto see you  Johnny [name] is your friend STay active I would buddy tape with a lot of activity or playing golf another month Try to do the taping less when at home starting in a week  See em again in 5 weeks IF not perfect

## 2018-07-18 ENCOUNTER — Other Ambulatory Visit: Payer: Self-pay | Admitting: Family Medicine

## 2018-07-27 ENCOUNTER — Other Ambulatory Visit: Payer: Self-pay | Admitting: Family Medicine

## 2018-08-05 ENCOUNTER — Other Ambulatory Visit: Payer: Self-pay | Admitting: Internal Medicine

## 2018-08-05 ENCOUNTER — Telehealth: Payer: Self-pay | Admitting: Internal Medicine

## 2018-08-05 NOTE — Telephone Encounter (Signed)
Attempted to call patient to schedule AWV, but patient did not answer. Will try to call patient again at a later time. SF

## 2018-08-09 ENCOUNTER — Telehealth: Payer: Self-pay | Admitting: Internal Medicine

## 2018-08-09 NOTE — Telephone Encounter (Signed)
Copied from [place] [phone]. Topic: Quick Communication - See Telephone Encounter >> Aug 09, 2018  9:10 AM Rayann Heman wrote: Pt called and stated that he needs DOT and [facility] medical forms for BP reading. Pt states that he will have forms faxed to us/ could we please fax over to [phone]

## 2018-08-11 ENCOUNTER — Ambulatory Visit: Payer: Medicare Other | Admitting: Family Medicine

## 2018-08-18 NOTE — Telephone Encounter (Signed)
I have been keeping a look out for forms and I have not yet received these. I called pt letting him know I have not seen these forms yet. Have you seen them by chance?

## 2018-08-18 NOTE — Telephone Encounter (Signed)
Patient called back to say that he received the completed and signed for from Dr Quay Burow already.

## 2018-08-25 ENCOUNTER — Other Ambulatory Visit: Payer: Self-pay | Admitting: Internal Medicine

## 2018-08-25 DIAGNOSIS — I10 Essential (primary) hypertension: Secondary | ICD-10-CM

## 2018-10-05 IMAGING — DX DG ELBOW COMPLETE 3+V*R*
4 series · 4 of 4 positions shown · non-contrast
Comparison: None.

CLINICAL DATA: Right elbow pain for several months without known
injury.

EXAM:
RIGHT ELBOW - COMPLETE 3+ VIEW

[elbow ap]
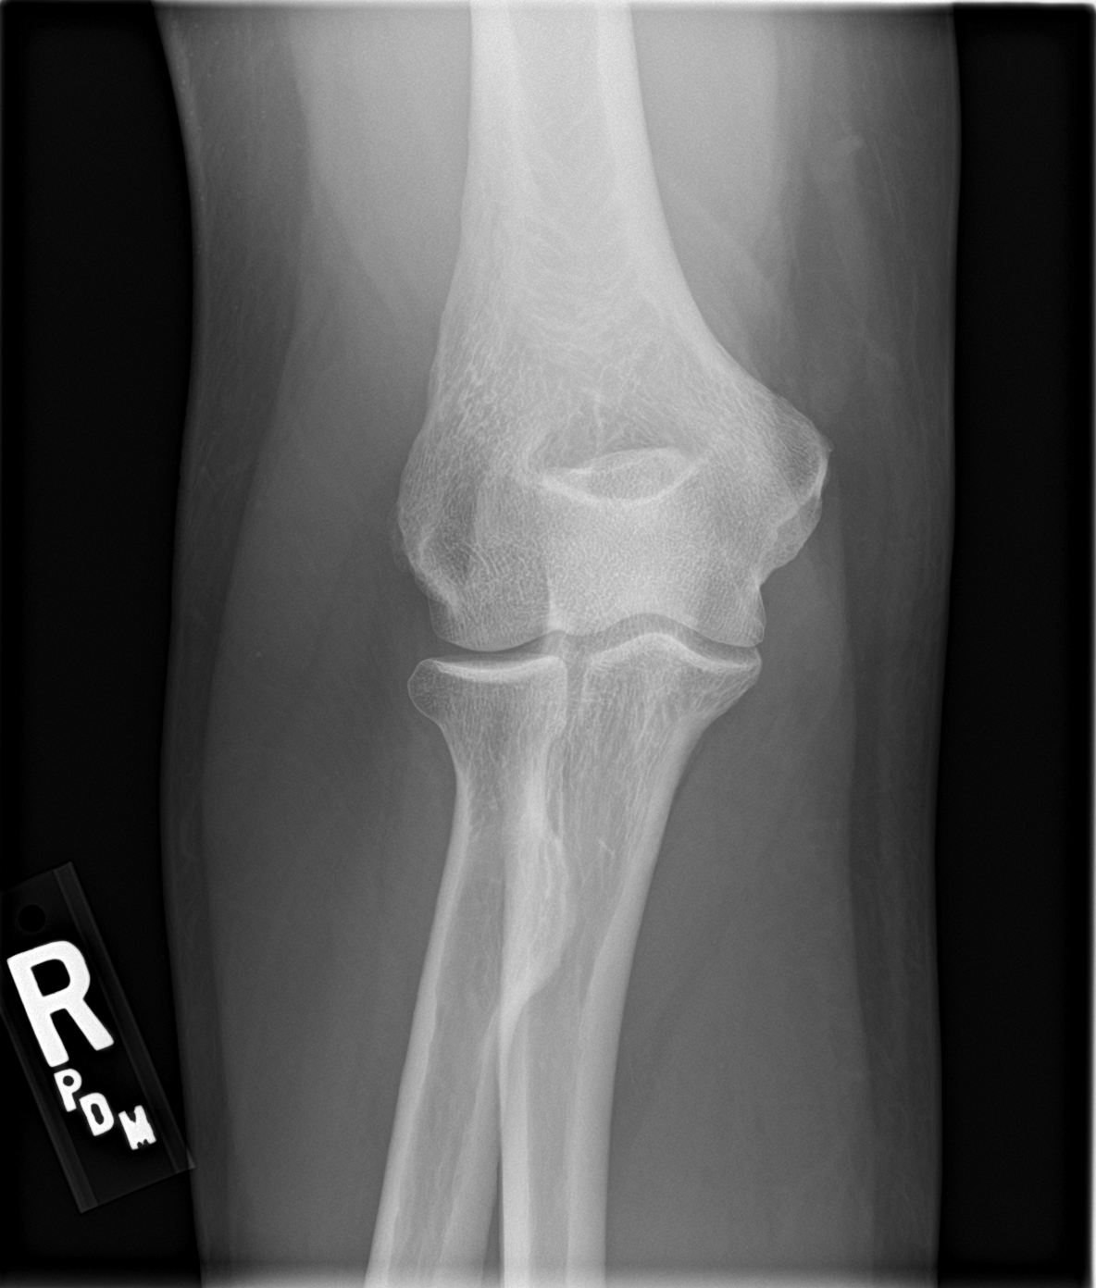

[elbow obl (1 of 2)]
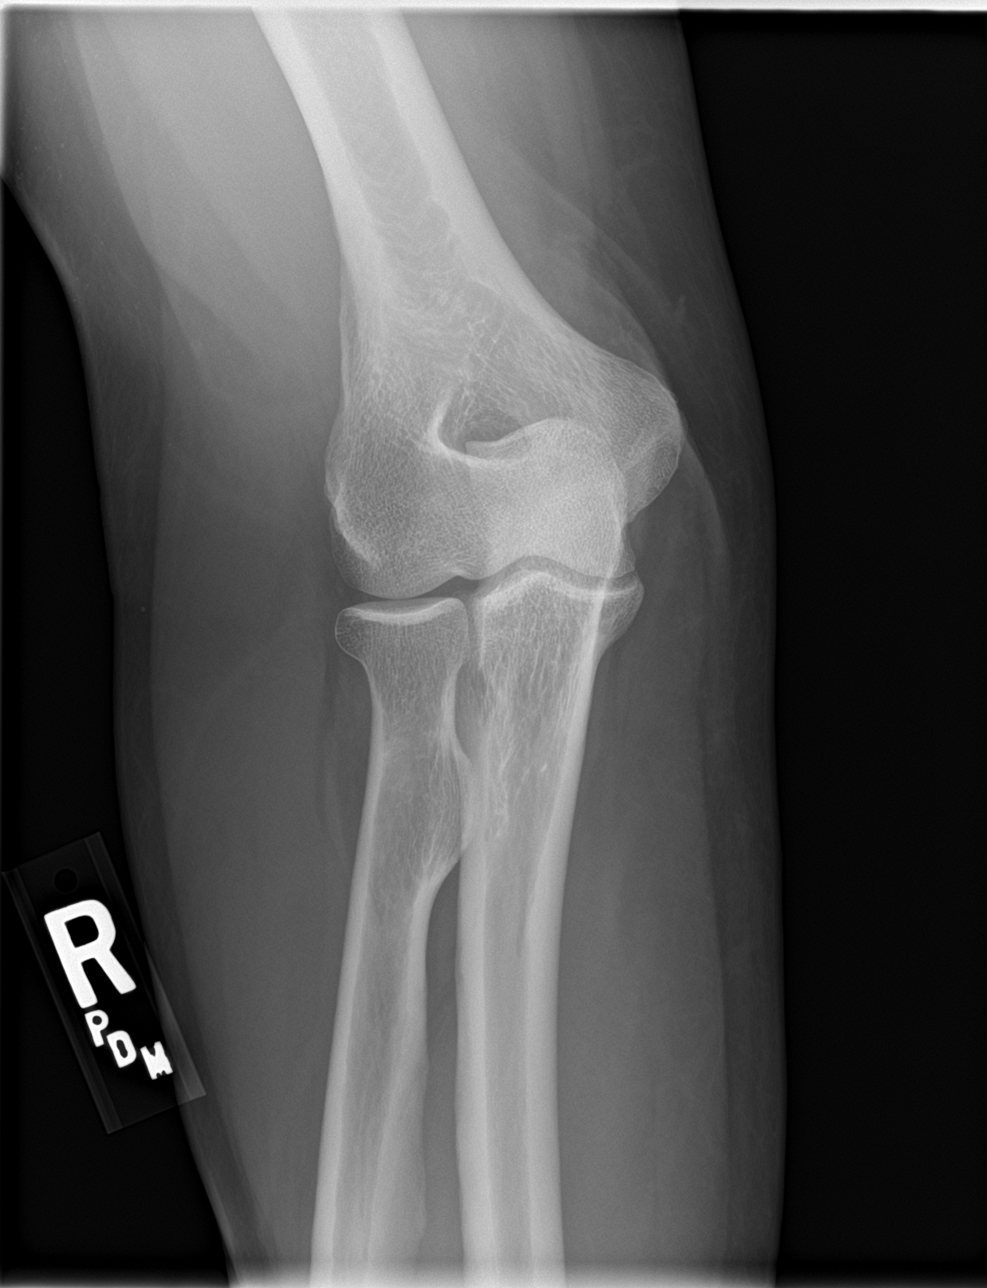

[elbow obl (2 of 2)]
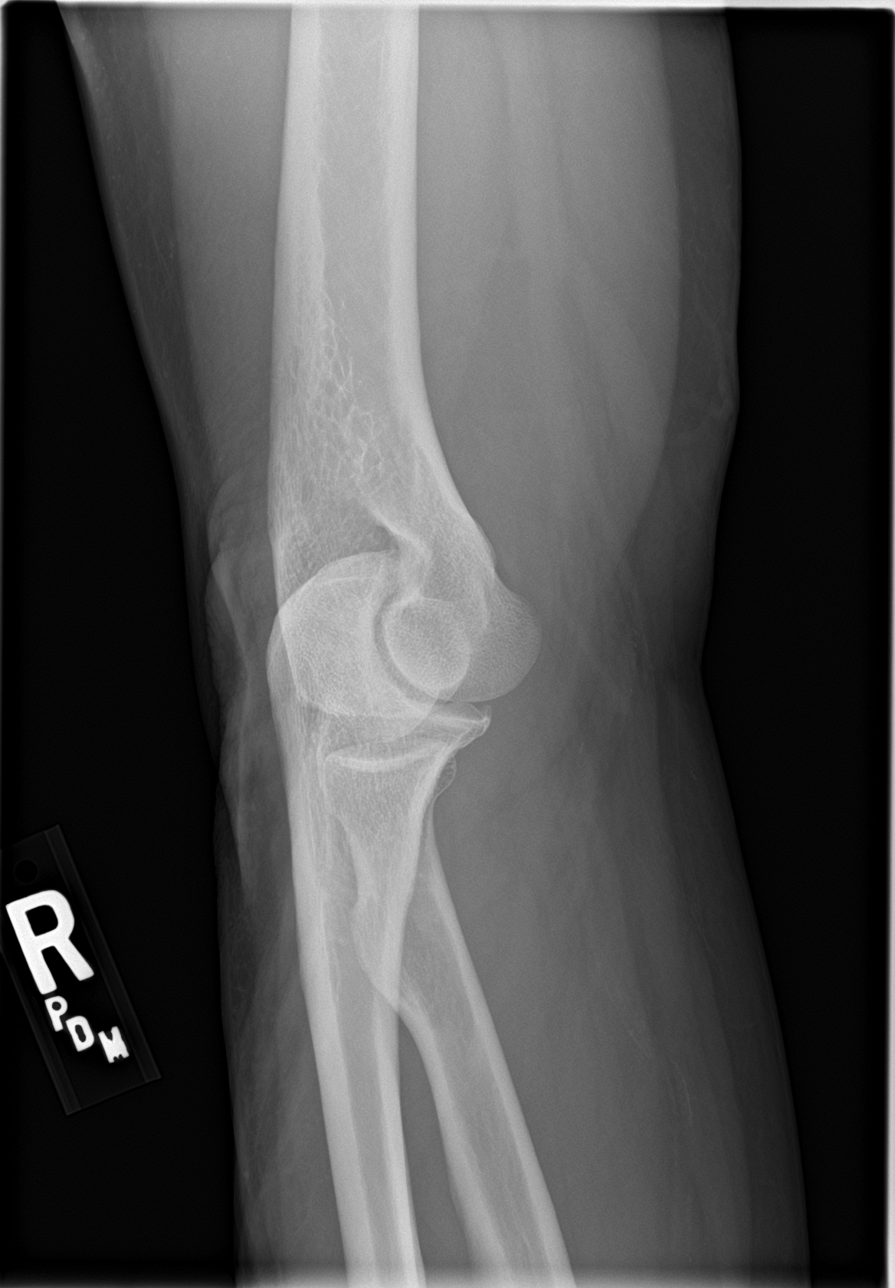

[elbow lat]
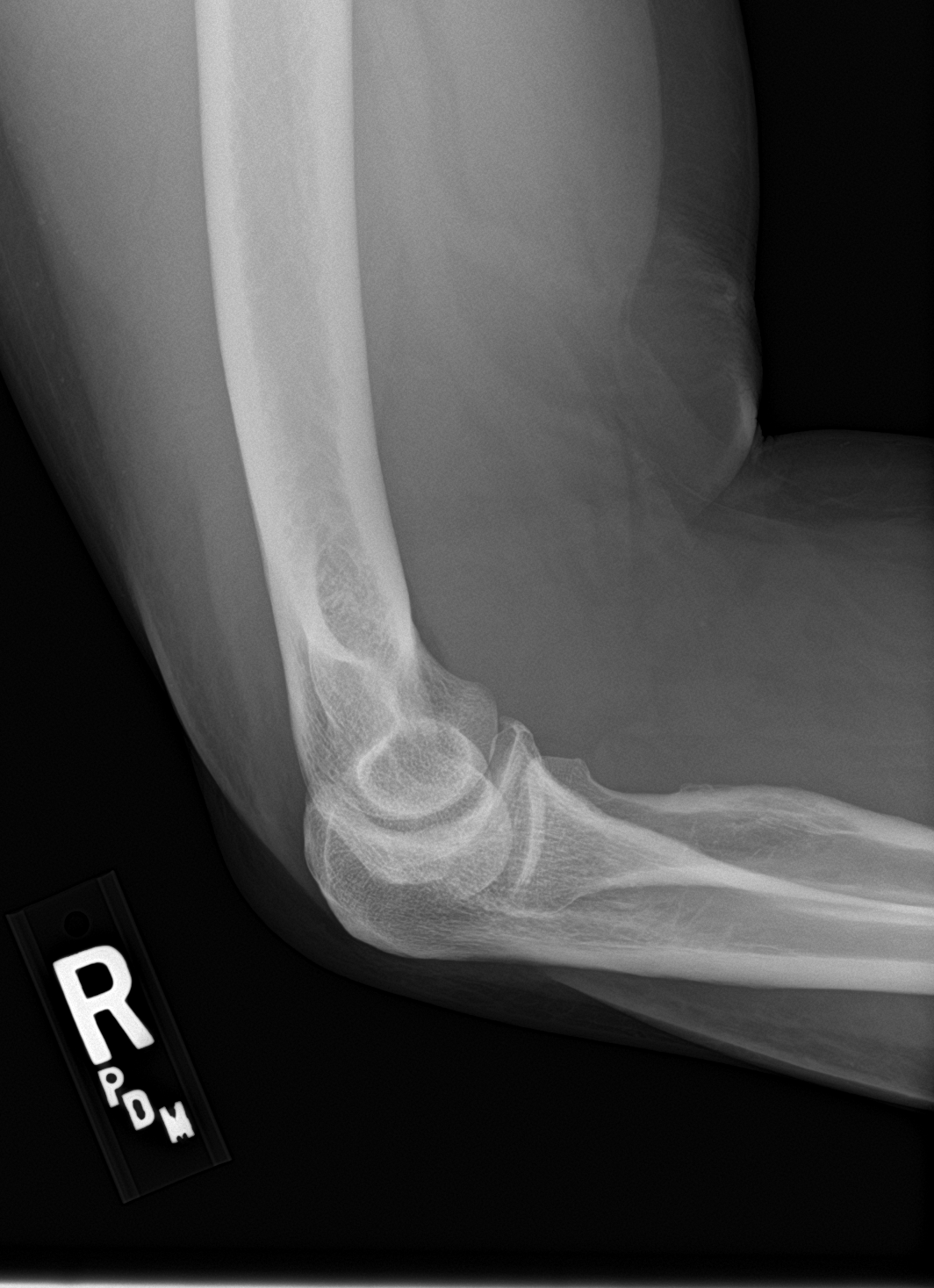

[4 of 4 positions shown; findings below may reference images not displayed]

FINDINGS: There is no evidence of fracture, dislocation, or joint effusion.
There is no evidence of arthropathy or other focal bone abnormality.
Soft tissues are unremarkable.
IMPRESSION: Normal right elbow.

## 2018-10-14 ENCOUNTER — Other Ambulatory Visit: Payer: Self-pay | Admitting: Family Medicine

## 2018-11-10 ENCOUNTER — Other Ambulatory Visit: Payer: Self-pay | Admitting: Internal Medicine

## 2018-11-10 DIAGNOSIS — K922 Gastrointestinal hemorrhage, unspecified: Secondary | ICD-10-CM

## 2018-11-14 ENCOUNTER — Other Ambulatory Visit: Payer: Self-pay | Admitting: Internal Medicine

## 2018-11-17 DIAGNOSIS — L719 Rosacea, unspecified: Secondary | ICD-10-CM | POA: Diagnosis not present

## 2018-11-17 DIAGNOSIS — L821 Other seborrheic keratosis: Secondary | ICD-10-CM | POA: Diagnosis not present

## 2018-11-30 ENCOUNTER — Telehealth: Payer: Self-pay

## 2018-11-30 NOTE — Telephone Encounter (Signed)
LVM for pt to call back in regards.  

## 2018-11-30 NOTE — Telephone Encounter (Signed)
Copied from [place] [id number]. Topic: General - Other >> Nov 30, 2018  9:23 AM Valla Leaver wrote: Reason for CRM: Patient would like Dr. Quay Burow nurse to call him back about a form for the [facility]. He will be faxing the form today but he'd like a call to discuss it.

## 2018-12-03 DIAGNOSIS — N5203 Combined arterial insufficiency and corporo-venous occlusive erectile dysfunction: Secondary | ICD-10-CM | POA: Diagnosis not present

## 2018-12-03 DIAGNOSIS — C61 Malignant neoplasm of prostate: Secondary | ICD-10-CM | POA: Diagnosis not present

## 2018-12-07 NOTE — Telephone Encounter (Signed)
Spoke with pt to advise that form was sent on 12/01/18. I re faxed it on today 12/07/18. I told him to call and follow up to make sure they received it this time. Will let me know otherwise.

## 2018-12-07 NOTE — Telephone Encounter (Signed)
Patient called to check on form that he faxed in on 11/30/2018 for the [facility] and is asking for an update. Per patient he received a letter from [facility] stating that they need this form back ASAP. Please advise and call patient at [facility] [phone]

## 2018-12-30 NOTE — Progress Notes (Signed)
Virtual Visit via Video Note  I connected with Johnny [name] on 12/31/18 at  9:00 AM EDT by a video enabled telemedicine application and verified that I am speaking with the correct person using two identifiers.   I discussed the limitations of evaluation and management by telemedicine and the availability of in person appointments. The patient expressed understanding and agreed to proceed.  The patient is currently at home and I am in the office.    No referring provider.    History of Present Illness: He is here for follow up of his chronic medical conditions and for a subsequent wellness visit.    Here for medicare wellness exam.   I have personally reviewed and have noted 1.The patient's medical and social history 2.Their use of alcohol, tobacco or illicit drugs 3.Their current medications and supplements 4.The patient's functional ability including ADL's, fall risks, home                 safety risk and hearing or visual impairment. 5.Diet and physical activities 6.Evidence for depression or mood disorders 7.Care team reviewed  -  Oncology - Dr Sherlynn Stalls, Sports medicine  - Dr Tamala Julian, Dermatology-Dr. Jimmye Norman    Are there smokers in your home (other than you)? No  Risk Factors Exercise: golf, some walking - was going to gym regularly before COVID-19 pandemic and not as active now Dietary issues discussed: eating more than usual due to being home a lot, eating a lof of fruit, watching the sugars - good at limiting the amount  Vitamin and supplement use:  Fish oil, B complex, vitamin D  Opiod use:  n/a   Cardiac risk factors: advanced age, hypertension, hyperlipidemia, and obesity.  Depression Screen  Have you felt down, depressed or hopeless? No  Have you felt little interest or pleasure in doing things?  No  Activities of Daily Living In your present state of health, do you have any difficulty performing  the following activities?:  Driving? No Managing money?  No Feeding yourself? No Getting from bed to chair? No Climbing a flight of stairs? No Preparing food and eating?: No Bathing or showering? No Getting dressed: No Getting to/using the toilet? No Moving around from place to place: No In the past year have you fallen or had a near fall?: No   Are you sexually active?  No  Do you have more than one partner?  No  Hearing Difficulties: No Do you often ask people to speak up or repeat themselves? No Do you experience ringing or noises in your ears? No Do you have difficulty understanding soft or whispered voices? No Vision:              Any change in vision:  no             Up to date with eye exam:   yes  Memory:  Do you feel that you have a problem with memory? No  Do you often misplace items? No  Do you feel safe at home?  Yes  Cognitive Testing  Alert, Orientated? Yes  Normal Appearance? Yes  Recall of three objects?  Yes  Can perform simple calculations? Yes  Displays appropriate judgment? Yes  Can read the correct time from a watch face? Yes   Advanced Directives have been discussed with the patient? Yes   Follow up of chronic medical problems:  He is exercising regularly, but not as much as he was prior to the coronavirus situation.  Diabetes: He is controlling his sugars with diet.  He is compliant with a diabetic diet.  He does not monitor his sugars at home.  He is up-to-date with an ophthalmology examination.   Hypertension: He is taking his medication daily. He is compliant with a low sodium diet.  He denies chest pain, palpitations, edema, shortness of breath and regular headaches.  He does not monitor his blood pressure at home.    Hyperlipidemia: He is taking his medication daily. He is compliant with a low fat/cholesterol diet. He denies myalgias out of the ordinary.   H/o GI bleed:  He is taking protonix daily.  He states rare GERD.  He denies  symptoms of GI bleeding - dark stool or blood in the stools.    Review of Systems  Constitutional: Negative for chills, fever and malaise/fatigue.  HENT: Negative for hearing loss and tinnitus.   Eyes: Negative for blurred vision and double vision.  Respiratory: Negative for cough, shortness of breath and wheezing.   Cardiovascular: Positive for leg swelling (R minimal swelling, L is normal). Negative for chest pain and palpitations.  Gastrointestinal: Positive for heartburn (rare). Negative for abdominal pain, blood in stool, constipation, diarrhea, melena and nausea.  Genitourinary: Negative for dysuria.  Musculoskeletal: Positive for joint pain. Negative for myalgias.  Skin: Negative for rash.  Neurological: Negative for dizziness and headaches.  Psychiatric/Behavioral: Negative for depression. The patient is not nervous/anxious.    Allergies as of 12/31/2018   No Known Allergies     Medication List       Accurate as of December 31, 2018  9:31 AM. Always use your most recent med list.        b complex vitamins tablet Take 1 tablet by mouth daily.   doxycycline 40 MG capsule Commonly known as:  ORACEA   gabapentin 100 MG capsule Commonly known as:  NEURONTIN TAKE 2 CAPSULES BY MOUTH EVERY DAY AT BEDTIME   hydrochlorothiazide 25 MG tablet Commonly known as:  HYDRODIURIL TAKE 1 TABLET BY MOUTH EVERY DAY   losartan 100 MG tablet Commonly known as:  COZAAR TAKE 1 TABLET (100 MG TOTAL) BY MOUTH DAILY.   metroNIDAZOLE 0.75 % cream Commonly known as:  METROCREAM   OMEGA 3 PO Take 1 tablet by mouth daily.   pantoprazole 40 MG tablet Commonly known as:  PROTONIX TAKE 1 TABLET BY MOUTH TWICE A DAY   Sulfacetamide Sodium-Sulfur 9.8-4.8 % Liqd   Vitamin D (Ergocalciferol) 1.25 MG (50000 UT) Caps capsule Commonly known as:  DRISDOL TAKE 1 CAPSULE (50,000 UNITS TOTAL) BY MOUTH EVERY 7 (SEVEN) DAYS.   Vitamin D3 50 MCG (2000 UT) capsule Take by mouth.      Past  Medical History:  Diagnosis Date  . Hyperlipidemia    LDL goal = < 110  . Hypertension   . Prostate cancer [facility]) 2008   Dr Darcus Austin, [facility]   Past Surgical History:  Procedure Laterality Date  . CERVICAL FUSION  2007   C5-C7 , Dr Saintclair Halsted   . COLONOSCOPY  2006   negative; [place], [place]  . ESOPHAGOGASTRODUODENOSCOPY Left 10/28/2015   Procedure: ESOPHAGOGASTRODUODENOSCOPY (EGD);  Surgeon: Teena Irani, MD;  Location: [facility];  Service: Endoscopy;  Laterality: Left;  . PROSTATECTOMY  11/2007   Dr.Moul, [facility]  . WRIST SURGERY  1998   [place] bone resection 2006    Social History   Socioeconomic History  . Marital status: Married    Spouse name: Not on file  . Number  of children: 3  . Years of education: Not on file  . Highest education level: Not on file  Occupational History  . Occupation: Teacher/Coach  Social Needs  . Financial resource strain: Not on file  . Food insecurity:    Worry: Not on file    Inability: Not on file  . Transportation needs:    Medical: Not on file    Non-medical: Not on file  Tobacco Use  . Smoking status: Never Smoker  . Smokeless tobacco: Never Used  Substance and Sexual Activity  . Alcohol use: No  . Drug use: No  . Sexual activity: Not on file  Lifestyle  . Physical activity:    Days per week: Not on file    Minutes per session: Not on file  . Stress: Not on file  Relationships  . Social connections:    Talks on phone: Not on file    Gets together: Not on file    Attends religious service: Not on file    Active member of club or organization: Not on file    Attends meetings of clubs or organizations: Not on file    Relationship status: Not on file  Other Topics Concern  . Not on file  Social History Narrative  . Not on file     Observations/Objective: Appears well in NAD  123/70 ish at [facility] last month   Lab Results  Component Value Date   WBC 6.5 12/25/2017   HGB 13.8 12/25/2017   HCT 41.0 12/25/2017   PLT 313.0 12/25/2017    GLUCOSE 150 (H) 06/18/2018   CHOL 157 06/18/2018   TRIG 166.0 (H) 06/18/2018   HDL 34.10 (L) 06/18/2018   LDLDIRECT 103.0 06/26/2017   LDLCALC 90 06/18/2018   ALT 92 (H) 06/18/2018   AST 49 (H) 06/18/2018   NA 139 06/18/2018   K 4.2 06/18/2018   CL 103 06/18/2018   CREATININE 0.96 06/18/2018   BUN 13 06/18/2018   CO2 30 06/18/2018   TSH 1.02 12/25/2017   PSA 4.30 (H) 06/21/2007   INR 1.16 10/28/2015   HGBA1C 7.0 (H) 06/18/2018   Also reviewed blood work from [facility] 11/2018  Assessment and Plan:  Wellness Exam: Immunizations  Discussed shingrix, others up to date Colonoscopy  Up to date  - due 2027 Eye exam   Up to date  - due 03/2019 Hearing loss   none Memory concerns/difficulties no concerns with memory.  No signs or symptoms of memory difficulties Independent of ADLs  - fully independent Stressed the importance of regular exercise   Discussed in detail with patient preventative screening tests/immunizations recommended for the next 5-10 years.   See Problem List for Assessment and Plan of chronic medical problems.   Follow Up Instructions:    I discussed the assessment and treatment plan with the patient. The patient was provided an opportunity to ask questions and all were answered. The patient agreed with the plan and demonstrated an understanding of the instructions.   The patient was advised to call back or seek an in-person evaluation if the symptoms worsen or if the condition fails to improve as anticipated.    Binnie Rail, MD

## 2018-12-31 ENCOUNTER — Ambulatory Visit (INDEPENDENT_AMBULATORY_CARE_PROVIDER_SITE_OTHER): Payer: Medicare Other | Admitting: Internal Medicine

## 2018-12-31 ENCOUNTER — Encounter: Payer: Self-pay | Admitting: Internal Medicine

## 2018-12-31 DIAGNOSIS — I10 Essential (primary) hypertension: Secondary | ICD-10-CM | POA: Diagnosis not present

## 2018-12-31 DIAGNOSIS — E119 Type 2 diabetes mellitus without complications: Secondary | ICD-10-CM

## 2018-12-31 DIAGNOSIS — E7849 Other hyperlipidemia: Secondary | ICD-10-CM

## 2018-12-31 DIAGNOSIS — Z8719 Personal history of other diseases of the digestive system: Secondary | ICD-10-CM | POA: Diagnosis not present

## 2018-12-31 DIAGNOSIS — Z8546 Personal history of malignant neoplasm of prostate: Secondary | ICD-10-CM

## 2018-12-31 DIAGNOSIS — L719 Rosacea, unspecified: Secondary | ICD-10-CM

## 2018-12-31 DIAGNOSIS — Z0000 Encounter for general adult medical examination without abnormal findings: Secondary | ICD-10-CM

## 2018-12-31 NOTE — Assessment & Plan Note (Signed)
Well-controlled on current regimen Managed by Dr. Minta Balsam

## 2018-12-31 NOTE — Assessment & Plan Note (Signed)
Taking Protonix No symptoms or signs of GI bleed Rare GERD No NSAIDs Recent CBC normal Continue Protonix daily

## 2018-12-31 NOTE — Assessment & Plan Note (Signed)
Following with oncology every 6 months Currently in observation

## 2018-12-31 NOTE — Assessment & Plan Note (Signed)
Cholesterol has been well controlled without medication We will continue to monitor and recheck in 3 months He has diabetes and should be on a statin-we will discuss at his next visit

## 2018-12-31 NOTE — Assessment & Plan Note (Signed)
BP Readings from Last 3 Encounters:  07/07/18 132/86  06/18/18 126/70  06/16/18 130/76     BP controlled Current regimen effective and well tolerated Continue current medications at current doses

## 2018-12-31 NOTE — Assessment & Plan Note (Addendum)
Lab Results  Component Value Date   HGBA1C 7.0 (H) 06/18/2018   Diet controlled He does not monitor his sugars at home.  Typically they have been well controlled, but A1c was slightly higher at his last visit We will have him follow-up in 3 months and do blood work at that time so we can recheck his A1c Encouraged him to continue regular exercise Continue compliance with a diabetic diet

## 2019-01-03 ENCOUNTER — Other Ambulatory Visit: Payer: Self-pay | Admitting: Family Medicine

## 2019-01-25 DIAGNOSIS — L719 Rosacea, unspecified: Secondary | ICD-10-CM | POA: Diagnosis not present

## 2019-01-25 DIAGNOSIS — L820 Inflamed seborrheic keratosis: Secondary | ICD-10-CM | POA: Diagnosis not present

## 2019-01-25 DIAGNOSIS — L578 Other skin changes due to chronic exposure to nonionizing radiation: Secondary | ICD-10-CM | POA: Diagnosis not present

## 2019-01-25 DIAGNOSIS — L570 Actinic keratosis: Secondary | ICD-10-CM | POA: Diagnosis not present

## 2019-01-25 DIAGNOSIS — L918 Other hypertrophic disorders of the skin: Secondary | ICD-10-CM | POA: Diagnosis not present

## 2019-01-30 ENCOUNTER — Other Ambulatory Visit: Payer: Self-pay | Admitting: Family Medicine

## 2019-01-31 NOTE — Telephone Encounter (Signed)
Refill done.  

## 2019-03-16 ENCOUNTER — Other Ambulatory Visit: Payer: Self-pay | Admitting: Internal Medicine

## 2019-03-16 DIAGNOSIS — I10 Essential (primary) hypertension: Secondary | ICD-10-CM

## 2019-03-22 ENCOUNTER — Other Ambulatory Visit: Payer: Self-pay | Admitting: Family Medicine

## 2019-03-28 ENCOUNTER — Other Ambulatory Visit: Payer: Self-pay | Admitting: Internal Medicine

## 2019-03-28 DIAGNOSIS — I10 Essential (primary) hypertension: Secondary | ICD-10-CM

## 2019-04-14 ENCOUNTER — Other Ambulatory Visit: Payer: Self-pay

## 2019-04-26 ENCOUNTER — Other Ambulatory Visit: Payer: Self-pay | Admitting: Family Medicine

## 2019-05-13 ENCOUNTER — Other Ambulatory Visit: Payer: Self-pay | Admitting: Internal Medicine

## 2019-05-15 ENCOUNTER — Other Ambulatory Visit: Payer: Self-pay | Admitting: Internal Medicine

## 2019-05-15 DIAGNOSIS — K922 Gastrointestinal hemorrhage, unspecified: Secondary | ICD-10-CM

## 2019-05-20 ENCOUNTER — Ambulatory Visit (INDEPENDENT_AMBULATORY_CARE_PROVIDER_SITE_OTHER): Payer: Medicare Other | Admitting: Family Medicine

## 2019-05-20 ENCOUNTER — Other Ambulatory Visit: Payer: Self-pay

## 2019-05-20 ENCOUNTER — Ambulatory Visit: Payer: Self-pay

## 2019-05-20 ENCOUNTER — Encounter: Payer: Self-pay | Admitting: Family Medicine

## 2019-05-20 VITALS — BP 140/80 | HR 74 | Ht 70.0 in | Wt 236.0 lb

## 2019-05-20 DIAGNOSIS — M79641 Pain in right hand: Secondary | ICD-10-CM

## 2019-05-20 DIAGNOSIS — M79642 Pain in left hand: Secondary | ICD-10-CM

## 2019-05-20 DIAGNOSIS — M1811 Unilateral primary osteoarthritis of first carpometacarpal joint, right hand: Secondary | ICD-10-CM

## 2019-05-20 NOTE — Assessment & Plan Note (Signed)
Patient given injection.  Tolerated the procedure well.  Discussed icing regimen and home exercise, which activities to do which wants to avoid.  Icing regimen.  Follow-up again in 4 to 6 weeks

## 2019-05-20 NOTE — Patient Instructions (Addendum)
Good to see you Voltaren Gel over the counter  Exercise 3 times a week Tape the finger with lots of activity See me again in 2 months if not perfect

## 2019-05-20 NOTE — Progress Notes (Addendum)
[name] [address] [place], [place] [address] Phone: [phone] Subjective:    I'm seeing this patient by the request  of:    CC: Thumb pain follow-up I [name] am serving as a scribe for Dr. Hulan Saas.  [id number]   06/17/2019 Still having difficulty.  We discussed buddy taping.  Callus formation is noted.  Continue vitamin D and K2.  Follow-up again in 3 to 4 weeks Update 05/20/2019 Johnny [name] is a 68 y.o. male coming in with complaint of right hand pain, MCP subluxation. Patient states that his hands feel about the same as they did his last visit. Would like an injection.   Patient states that it does not stop him from activity.  Noticing some swelling of the first metacarpal at this point.     Past Medical History:  Diagnosis Date  . Hyperlipidemia    LDL goal = < 110  . Hypertension   . Prostate cancer [facility]) 2008   Dr Darcus Austin, [facility]   Past Surgical History:  Procedure Laterality Date  . CERVICAL FUSION  2007   C5-C7 , Dr Saintclair Halsted   . COLONOSCOPY  2006   negative; [place], [place]  . ESOPHAGOGASTRODUODENOSCOPY Left 10/28/2015   Procedure: ESOPHAGOGASTRODUODENOSCOPY (EGD);  Surgeon: Teena Irani, MD;  Location: [facility];  Service: Endoscopy;  Laterality: Left;  . PROSTATECTOMY  11/2007   Dr.Moul, [facility]  . WRIST SURGERY  1998   Hammate bone resection 2006   Social History   Socioeconomic History  . Marital status: Married    Spouse name: Not on file  . Number of children: 3  . Years of education: Not on file  . Highest education level: Not on file  Occupational History  . Occupation: Teacher/Coach  Social Needs  . Financial resource strain: Not on file  . Food insecurity    Worry: Not on file    Inability: Not on file  . Transportation needs    Medical: Not on file    Non-medical: Not on file  Tobacco Use  . Smoking status: Never Smoker  . Smokeless tobacco: Never Used  Substance and Sexual Activity  . Alcohol  use: No  . Drug use: No  . Sexual activity: Not on file  Lifestyle  . Physical activity    Days per week: Not on file    Minutes per session: Not on file  . Stress: Not on file  Relationships  . Social Herbalist on phone: Not on file    Gets together: Not on file    Attends religious service: Not on file    Active member of club or organization: Not on file    Attends meetings of clubs or organizations: Not on file    Relationship status: Not on file  Other Topics Concern  . Not on file  Social History Narrative  . Not on file   No Known Allergies Family History  Problem Relation Age of Onset  . Breast cancer Mother   . Alzheimer's disease Mother   . Colon cancer Paternal Grandmother   . Diabetes Paternal Grandfather   . Heart attack Paternal Grandfather 52  . Breast cancer Maternal Grandmother   . Heart attack Maternal Grandfather 72  . Heart disease Father        Atrial fibrillation  . Heart failure Father   . Stroke Neg Hx      Current Outpatient Medications (Cardiovascular):  .  hydrochlorothiazide (  HYDRODIURIL) 25 MG tablet, TAKE 1 TABLET BY MOUTH EVERY DAY .  losartan (COZAAR) 100 MG tablet, TAKE 1 TABLET (100 MG TOTAL) BY MOUTH DAILY.     Current Outpatient Medications (Other):  .  b complex vitamins tablet, Take 1 tablet by mouth daily. .  Cholecalciferol (VITAMIN D3) 50 MCG (2000 UT) capsule, Take by mouth. .  doxycycline (ORACEA) 40 MG capsule,  .  gabapentin (NEURONTIN) 100 MG capsule, TAKE 2 CAPSULES BY MOUTH AT BEDTIME .  metroNIDAZOLE (METROCREAM) 0.75 % cream,  .  Omega-3 Fatty Acids (OMEGA 3 PO), Take 1 tablet by mouth daily.  .  pantoprazole (PROTONIX) 40 MG tablet, TAKE 1 TABLET BY MOUTH TWICE A DAY .  Sulfacetamide Sodium-Sulfur 9.8-4.8 % LIQD,  .  Vitamin D, Ergocalciferol, (DRISDOL) 1.25 MG (50000 UT) CAPS capsule, TAKE 1 CAPSULE BY MOUTH ONE TIME PER WEEK    Past medical history, social, surgical and family history all  reviewed in electronic medical record.  No pertanent information unless stated regarding to the chief complaint.   Review of Systems:  No headache, visual changes, nausea, vomiting, diarrhea, constipation, dizziness, abdominal pain, skin rash, fevers, chills, night sweats, weight loss, swollen lymph nodes, body aches, joint swelling, chest pain, shortness of breath, mood changes.  Positive muscle aches  Objective  Blood pressure 140/80, pulse 74, height 5\' 10"  (1.778 m), weight 236 lb (107 kg), SpO2 98 %.    General: No apparent distress alert and oriented x3 mood and affect normal, dressed appropriately.  HEENT: Pupils equal, extraocular movements intact  Respiratory: Patient's speak in full sentences and does not appear short of breath  Cardiovascular: No lower extremity edema, non tender, no erythema  Skin: Warm dry intact with no signs of infection or rash on extremities or on axial skeleton.  Abdomen: Soft nontender  Neuro: Cranial nerves II through XII are intact, neurovascularly intact in all extremities with 2+ DTRs and 2+ pulses.  Lymph: No lymphadenopathy of posterior or anterior cervical chain or axillae bilaterally.  Gait normal with good balance and coordination.  MSK:  tender with full range of motion and good stability and symmetric strength and tone of shoulders, elbows,  hip, knee and ankles bilaterally.  Right hand exam shows the patient does have swelling of the [facility] joint.  Positive grind test.  Good grip strength is still noted and neurovascular intact distally, full range of motion of the ankle noted.   Procedure: Real-time Ultrasound Guided Injection of right CMC joint Device: GE Logiq Q7 Ultrasound guided injection is preferred based studies that show increased duration, increased effect, greater accuracy, decreased procedural pain, increased response rate, and decreased cost with ultrasound guided versus blind injection.  Verbal informed consent obtained.  Time-out  conducted.  Noted no overlying erythema, induration, or other signs of local infection.  Skin prepped in a sterile fashion.  Local anesthesia: Topical Ethyl chloride.  With sterile technique and under real time ultrasound guidance: With a 25-gauge half inch needle injected with 0.5 cc 0.5% Marcaine and 0.5 cc of Kenalog 40 mg/mL Completed without difficulty  Pain immediately resolved suggesting accurate placement of the medication.  Advised to call if fevers/chills, erythema, induration, drainage, or persistent bleeding.  Images permanently stored and available for review in the ultrasound unit.  Impression: Technically successful ultrasound guided injection.    Impression and Recommendations:     This case required medical decision making of moderate complexity. The above documentation has been reviewed and is accurate and complete Johnny [name]  Johnny Bound, DO       Note: This dictation was prepared with Dragon dictation along with smaller phrase technology. Any transcriptional errors that result from this process are unintentional.

## 2019-06-03 DIAGNOSIS — N5203 Combined arterial insufficiency and corporo-venous occlusive erectile dysfunction: Secondary | ICD-10-CM | POA: Diagnosis not present

## 2019-06-03 DIAGNOSIS — K4090 Unilateral inguinal hernia, without obstruction or gangrene, not specified as recurrent: Secondary | ICD-10-CM | POA: Diagnosis not present

## 2019-06-03 DIAGNOSIS — C61 Malignant neoplasm of prostate: Secondary | ICD-10-CM | POA: Diagnosis not present

## 2019-06-13 ENCOUNTER — Other Ambulatory Visit: Payer: Self-pay | Admitting: Family Medicine

## 2019-07-21 NOTE — Progress Notes (Signed)
[name] [name], [name] [address] Phone: [phone] Subjective:   Johnny Johnny [name], am serving as a scribe for Dr. Hulan Saas.   CC: Bilateral hand pain follow-up  [id number]   05/20/2019 Patient given injection.  Tolerated the procedure well.  Discussed icing regimen and home exercise, which activities to do which wants to avoid.  Icing regimen.  Follow-up again in 4 to 6 weeks Update 07/22/2019 Johnny Johnny [name] is a 68 y.o. male coming in with complaint of bilateral hand pain. Patient states that his pain in the [facility] joint has subsided on the right hand. States that he is having 3rd and 4th finger swelling since last visit in right hand. Johnny [name] injury to hand. Is using voltaren gel over the fingers. Can have an occasional twinge of pain in the [facility] joint.      Past Medical History:  Diagnosis Date  . Hyperlipidemia    LDL goal = < 110  . Hypertension   . Prostate cancer [facility]) 2008   Dr Darcus Austin, [facility]   Past Surgical History:  Procedure Laterality Date  . CERVICAL FUSION  2007   C5-C7 , Dr Saintclair Halsted   . COLONOSCOPY  2006   negative; [place],   . ESOPHAGOGASTRODUODENOSCOPY Left 10/28/2015   Procedure: ESOPHAGOGASTRODUODENOSCOPY (EGD);  Surgeon: Teena Irani, MD;  Location: [facility];  Service: Endoscopy;  Laterality: Left;  . PROSTATECTOMY  11/2007   Dr.Moul, [facility]  . WRIST SURGERY  1998   [place]ate bone resection 2006   Social History   Socioeconomic History  . Marital status: Married    Spouse name: Not on file  . Number of children: 3  . Years of education: Not on file  . Highest education level: Not on file  Occupational History  . Occupation: Teacher/Coach  Social Needs  . Financial resource strain: Not on file  . Food insecurity    Worry: Not on file    Inability: Not on file  . Transportation needs    Medical: Not on file    Non-medical: Not on file  Tobacco Use  . Smoking status: Never Smoker  . Smokeless tobacco:  Never Used  Substance and Sexual Activity  . Alcohol use: Johnny [name]  . Drug use: Johnny [name]  . Sexual activity: Not on file  Lifestyle  . Physical activity    Days per week: Not on file    Minutes per session: Not on file  . Stress: Not on file  Relationships  . Social Herbalist on phone: Not on file    Gets together: Not on file    Attends religious service: Not on file    Active member of club or organization: Not on file    Attends meetings of clubs or organizations: Not on file    Relationship status: Not on file  Other Topics Concern  . Not on file  Social History Narrative  . Not on file   Johnny [name] Known Allergies Family History  Problem Relation Age of Onset  . Breast cancer Mother   . Alzheimer's disease Mother   . Colon cancer Paternal Grandmother   . Diabetes Paternal Grandfather   . Heart attack Paternal Grandfather 56  . Breast cancer Maternal Grandmother   . Heart attack Maternal Grandfather 72  . Heart disease Father        Atrial fibrillation  . Heart failure Father   . Stroke Neg Hx      Current  Outpatient Medications (Cardiovascular):  .  hydrochlorothiazide (HYDRODIURIL) 25 MG tablet, TAKE 1 TABLET BY MOUTH EVERY DAY .  losartan (COZAAR) 100 MG tablet, TAKE 1 TABLET (100 MG TOTAL) BY MOUTH DAILY.     Current Outpatient Medications (Other):  .  b complex vitamins tablet, Take 1 tablet by mouth daily. .  Cholecalciferol (VITAMIN D3) 50 MCG (2000 UT) capsule, Take by mouth. .  doxycycline (ORACEA) 40 MG capsule,  .  gabapentin (NEURONTIN) 100 MG capsule, TAKE 2 CAPSULES BY MOUTH AT BEDTIME .  metroNIDAZOLE (METROCREAM) 0.75 % cream,  .  Omega-3 Fatty Acids (OMEGA 3 PO), Take 1 tablet by mouth daily.  .  pantoprazole (PROTONIX) 40 MG tablet, TAKE 1 TABLET BY MOUTH TWICE A DAY .  Sulfacetamide Sodium-Sulfur 9.8-4.8 % LIQD,  .  Vitamin D, Ergocalciferol, (DRISDOL) 1.25 MG (50000 UT) CAPS capsule, TAKE 1 CAPSULE BY MOUTH ONE TIME PER WEEK    Past medical  history, social, surgical and family history all reviewed in electronic medical record.  Johnny [name] pertanent information unless stated regarding to the chief complaint.   Review of Systems:  Johnny [name] headache, visual changes, nausea, vomiting, diarrhea, constipation, dizziness, abdominal pain, skin rash, fevers, chills, night sweats, weight loss, swollen lymph nodes, body aches, joint swelling, muscle aches, chest pain, shortness of breath, mood changes.   Objective  Blood pressure 110/64, pulse 61, height 5\' 10"  (1.778 m), weight 236 lb (107 kg), SpO2 98 %.   General: Johnny [name] apparent distress alert and oriented x3 mood and affect normal, dressed appropriately.  HEENT: Pupils equal, extraocular movements intact  Respiratory: Patient's speak in full sentences and does not appear short of breath  Cardiovascular: Johnny [name] lower extremity edema, non tender, Johnny [name] erythema  Skin: Warm dry intact with Johnny [name] signs of infection or rash on extremities or on axial skeleton.  Abdomen: Soft nontender  Neuro: Cranial nerves II through XII are intact, neurovascularly intact in all extremities with 2+ DTRs and 2+ pulses.  Lymph: Johnny [name] lymphadenopathy of posterior or anterior cervical chain or axillae bilaterally.  Gait normal with good balance and coordination.  MSK:  tender with full range of motion and good stability and symmetric strength and tone of shoulders, elbows, wrist, hip, knee and ankles bilaterally.  Hand exam shows the patient does have a trigger finger noted of the right middle finger.  At the A1 Johnny [name].  Mild tenderness to palpation.  Full range of motion.  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  Good grip strength though noted   Impression and Recommendations:     This case required medical decision making of moderate complexity. The above documentation has been reviewed and is accurate and complete Johnny Pulley, DO       Note: This dictation was prepared with Dragon dictation along with smaller  phrase technology. Any transcriptional errors that result from this process are unintentional.

## 2019-07-22 ENCOUNTER — Ambulatory Visit (INDEPENDENT_AMBULATORY_CARE_PROVIDER_SITE_OTHER): Payer: Medicare Other | Admitting: Family Medicine

## 2019-07-22 ENCOUNTER — Other Ambulatory Visit: Payer: Self-pay

## 2019-07-22 ENCOUNTER — Encounter: Payer: Self-pay | Admitting: Family Medicine

## 2019-07-22 DIAGNOSIS — M65339 Trigger finger, unspecified middle finger: Secondary | ICD-10-CM

## 2019-07-22 DIAGNOSIS — M6530 Trigger finger, unspecified finger: Secondary | ICD-10-CM | POA: Insufficient documentation

## 2019-07-22 NOTE — Assessment & Plan Note (Signed)
Trigger finger noted.  Discussed which activities to do which wants to avoid.  Discussed icing regimen.  Discussed bracing at night.  Worsening symptoms consider injection follow-up 3 months

## 2019-07-22 NOTE — Patient Instructions (Signed)
Finger splint at night If not better see me in 2 months

## 2019-08-04 DIAGNOSIS — H2513 Age-related nuclear cataract, bilateral: Secondary | ICD-10-CM | POA: Diagnosis not present

## 2019-08-14 ENCOUNTER — Other Ambulatory Visit: Payer: Self-pay | Admitting: Internal Medicine

## 2019-08-28 ENCOUNTER — Other Ambulatory Visit: Payer: Self-pay | Admitting: Internal Medicine

## 2019-08-28 DIAGNOSIS — K922 Gastrointestinal hemorrhage, unspecified: Secondary | ICD-10-CM

## 2019-08-29 ENCOUNTER — Other Ambulatory Visit: Payer: Self-pay | Admitting: Family Medicine

## 2019-09-01 ENCOUNTER — Other Ambulatory Visit: Payer: Self-pay | Admitting: Internal Medicine

## 2019-09-01 ENCOUNTER — Other Ambulatory Visit: Payer: Self-pay | Admitting: Family Medicine

## 2019-09-01 DIAGNOSIS — I10 Essential (primary) hypertension: Secondary | ICD-10-CM

## 2019-09-23 ENCOUNTER — Ambulatory Visit: Payer: Medicare Other | Admitting: Family Medicine

## 2019-09-28 DIAGNOSIS — Z20822 Contact with and (suspected) exposure to covid-19: Secondary | ICD-10-CM | POA: Diagnosis not present

## 2019-09-28 DIAGNOSIS — Z01812 Encounter for preprocedural laboratory examination: Secondary | ICD-10-CM | POA: Diagnosis not present

## 2019-09-28 DIAGNOSIS — E782 Mixed hyperlipidemia: Secondary | ICD-10-CM | POA: Diagnosis not present

## 2019-09-28 DIAGNOSIS — E119 Type 2 diabetes mellitus without complications: Secondary | ICD-10-CM | POA: Diagnosis not present

## 2019-09-28 DIAGNOSIS — D508 Other iron deficiency anemias: Secondary | ICD-10-CM | POA: Diagnosis not present

## 2019-09-28 DIAGNOSIS — I10 Essential (primary) hypertension: Secondary | ICD-10-CM | POA: Diagnosis not present

## 2019-09-28 DIAGNOSIS — E1122 Type 2 diabetes mellitus with diabetic chronic kidney disease: Secondary | ICD-10-CM | POA: Diagnosis not present

## 2019-09-28 DIAGNOSIS — Z20828 Contact with and (suspected) exposure to other viral communicable diseases: Secondary | ICD-10-CM | POA: Diagnosis not present

## 2019-09-28 DIAGNOSIS — C61 Malignant neoplasm of prostate: Secondary | ICD-10-CM | POA: Diagnosis not present

## 2019-09-28 LAB — HEMOGLOBIN A1C: Hemoglobin A1C: 10.4

## 2019-09-29 ENCOUNTER — Encounter: Payer: Self-pay | Admitting: Internal Medicine

## 2019-09-29 ENCOUNTER — Ambulatory Visit (INDEPENDENT_AMBULATORY_CARE_PROVIDER_SITE_OTHER): Payer: Medicare Other | Admitting: Internal Medicine

## 2019-09-29 ENCOUNTER — Other Ambulatory Visit: Payer: Self-pay

## 2019-09-29 VITALS — BP 142/86 | HR 99 | Temp 98.0°F | Ht 70.0 in | Wt 227.0 lb

## 2019-09-29 DIAGNOSIS — E119 Type 2 diabetes mellitus without complications: Secondary | ICD-10-CM | POA: Diagnosis not present

## 2019-09-29 DIAGNOSIS — I10 Essential (primary) hypertension: Secondary | ICD-10-CM | POA: Diagnosis not present

## 2019-09-29 DIAGNOSIS — E7849 Other hyperlipidemia: Secondary | ICD-10-CM | POA: Diagnosis not present

## 2019-09-29 MED ORDER — METFORMIN HCL ER 500 MG PO TB24: 500.0000 mg | ORAL_TABLET | Freq: Every day | ORAL | 3 refills | Status: DC

## 2019-09-29 NOTE — Assessment & Plan Note (Signed)
stable overall by history and exam, recent data reviewed with pt, and pt to continue medical treatment as before,  to f/u any worsening symptoms or concerns  

## 2019-09-29 NOTE — Progress Notes (Signed)
Subjective:    Patient ID: Johnny [name], male    DOB: Sep 16, 1950, 69 y.o.   MRN: [id number]  HPI   Here to f/u after surgury at [facility] cancelled after preop a1c was 10.4, Pt denies chest pain, increasing sob or doe, wheezing, orthopnea, PND, increased LE swelling, palpitations, dizziness or syncope.  Pt denies new neurological symptoms such as new headache, or facial or extremity weakness or numbness.  Pt denies low sugar episode.  Pt states overall good compliance with meds, mostly trying to follow appropriate diet, with wt overall down several lbs with polyuria. Wt Readings from Last 3 Encounters:  09/29/19 227 lb (103 kg)  07/22/19 236 lb (107 kg)  05/20/19 236 lb (107 kg)  Admits to quite poor diet with numerous sweets over the holidays, and has just began last wk to go back to more usual diet.   Past Medical History:  Diagnosis Date  . Hyperlipidemia    LDL goal = < 110  . Hypertension   . Prostate cancer [facility]) 2008   Dr Darcus Austin, [facility]   Past Surgical History:  Procedure Laterality Date  . CERVICAL FUSION  2007   C5-C7 , Dr Saintclair Halsted   . COLONOSCOPY  2006   negative; [place], [place]  . ESOPHAGOGASTRODUODENOSCOPY Left 10/28/2015   Procedure: ESOPHAGOGASTRODUODENOSCOPY (EGD);  Surgeon: Teena Irani, MD;  Location: [facility];  Service: Endoscopy;  Laterality: Left;  . PROSTATECTOMY  11/2007   Dr.Moul, [facility]  . WRIST SURGERY  1998   [place] bone resection 2006    reports that he has never smoked. He has never used smokeless tobacco. He reports that he does not drink alcohol or use drugs. family history includes Alzheimer's disease in his mother; Breast cancer in his maternal grandmother and mother; Colon cancer in his paternal grandmother; Diabetes in his paternal grandfather; Heart attack (age of onset: 62) in his maternal grandfather; Heart attack (age of onset: 15) in his paternal grandfather; Heart disease in his father; Heart failure in his father. No Known Allergies Current Outpatient  Medications on File Prior to Visit  Medication Sig Dispense Refill  . b complex vitamins tablet Take 1 tablet by mouth daily.    . Cholecalciferol (VITAMIN D3) 50 MCG (2000 UT) capsule Take by mouth.    . doxycycline (ORACEA) 40 MG capsule     . gabapentin (NEURONTIN) 100 MG capsule TAKE 2 CAPSULES BY MOUTH AT BEDTIME 180 capsule 0  . hydrochlorothiazide (HYDRODIURIL) 25 MG tablet TAKE 1 TABLET BY MOUTH EVERY DAY 90 tablet 0  . losartan (COZAAR) 100 MG tablet TAKE 1 TABLET (100 MG TOTAL) BY MOUTH DAILY. 90 tablet 1  . metroNIDAZOLE (METROCREAM) 0.75 % cream     . Omega-3 Fatty Acids (OMEGA 3 PO) Take 1 tablet by mouth daily.     . pantoprazole (PROTONIX) 40 MG tablet TAKE 1 TABLET BY MOUTH TWICE A DAY 180 tablet 0  . Sulfacetamide Sodium-Sulfur 9.8-4.8 % LIQD     . Vitamin D, Ergocalciferol, (DRISDOL) 1.25 MG (50000 UT) CAPS capsule TAKE 1 CAPSULE BY MOUTH ONE TIME PER WEEK 12 capsule 0   No current facility-administered medications on file prior to visit.   Review of Systems  Constitutional: Negative for other unusual diaphoresis or sweats HENT: Negative for ear discharge or swelling Eyes: Negative for other worsening visual disturbances Respiratory: Negative for stridor or other swelling  Gastrointestinal: Negative for worsening distension or other blood Genitourinary: Negative for retention or other urinary change Musculoskeletal: Negative for  other MSK pain or swelling Skin: Negative for color change or other new lesions Neurological: Negative for worsening tremors and other numbness  Psychiatric/Behavioral: Negative for worsening agitation or other fatigue All otherwise neg per pt     Objective:   Physical Exam BP (!) 142/86   Pulse 99   Temp 98 F (36.7 C) (Oral)   Ht 5\' 10"  (1.778 m)   Wt 227 lb (103 kg)   SpO2 96%   BMI 32.57 kg/m  VS noted,  Constitutional: Pt appears in NAD HENT: Head: NCAT.  Right Ear: External ear normal.  Left Ear: External ear normal.    Eyes: . Pupils are equal, round, and reactive to light. Conjunctivae and EOM are normal Nose: without d/c or deformity Neck: Neck supple. Gross normal ROM Cardiovascular: Normal rate and regular rhythm.   Pulmonary/Chest: Effort normal and breath sounds without rales or wheezing.  Abd:  Soft, NT, ND, + BS, no organomegaly Neurological: Pt is alert. At baseline orientation, motor grossly intact Skin: Skin is warm. No rashes, other new lesions, no LE edema Psychiatric: Pt behavior is normal without agitation  All otherwise neg per pt  Lab Results  Component Value Date   WBC 6.5 12/25/2017   HGB 13.8 12/25/2017   HCT 41.0 12/25/2017   PLT 313.0 12/25/2017   GLUCOSE 150 (H) 06/18/2018   CHOL 157 06/18/2018   TRIG 166.0 (H) 06/18/2018   HDL 34.10 (L) 06/18/2018   LDLDIRECT 103.0 06/26/2017   LDLCALC 90 06/18/2018   ALT 92 (H) 06/18/2018   AST 49 (H) 06/18/2018   NA 139 06/18/2018   K 4.2 06/18/2018   CL 103 06/18/2018   CREATININE 0.96 06/18/2018   BUN 13 06/18/2018   CO2 30 06/18/2018   TSH 1.02 12/25/2017   PSA 4.30 (H) 06/21/2007   INR 1.16 10/28/2015   HGBA1C 7.0 (H) 06/18/2018       Assessment & Plan:

## 2019-09-29 NOTE — Patient Instructions (Signed)
Please take all new medication as prescribed - the metformin ER 500 mg - 1 per day  Please return to your Diabetic diet that worked so well before  Please continue all other medications as before, and refills have been done if requested.  Please have the pharmacy call with any other refills you may need.  Please keep your appointments with your specialists as you may have planned  Please return in 6 weeks to Dr Quay Burow, or sooner if needed

## 2019-09-29 NOTE — Assessment & Plan Note (Signed)
Uncontrolled with dietary non compliance, now back to better but a1c too high for surgury, will encourage contd controlled diet, declines dietary referral, start metformin ER 500 qd and f/u Dr Quay Burow PCP in 6 wks; pt advised also that it appears his elective surgury will need to be rescheduled possibly even further out as pandemic is leading state mandate for Jan 25 elective surgury moratorium

## 2019-11-02 DIAGNOSIS — K219 Gastro-esophageal reflux disease without esophagitis: Secondary | ICD-10-CM | POA: Diagnosis not present

## 2019-11-02 DIAGNOSIS — R142 Eructation: Secondary | ICD-10-CM | POA: Diagnosis not present

## 2019-11-02 DIAGNOSIS — R197 Diarrhea, unspecified: Secondary | ICD-10-CM | POA: Diagnosis not present

## 2019-11-02 DIAGNOSIS — K2270 Barrett's esophagus without dysplasia: Secondary | ICD-10-CM | POA: Diagnosis not present

## 2019-11-02 DIAGNOSIS — Z1211 Encounter for screening for malignant neoplasm of colon: Secondary | ICD-10-CM | POA: Diagnosis not present

## 2019-11-10 ENCOUNTER — Other Ambulatory Visit: Payer: Self-pay

## 2019-11-10 ENCOUNTER — Ambulatory Visit (INDEPENDENT_AMBULATORY_CARE_PROVIDER_SITE_OTHER): Payer: Medicare Other | Admitting: Internal Medicine

## 2019-11-10 ENCOUNTER — Encounter: Payer: Self-pay | Admitting: Internal Medicine

## 2019-11-10 VITALS — BP 126/74 | HR 73 | Temp 98.4°F | Resp 16 | Ht 70.0 in | Wt 218.0 lb

## 2019-11-10 DIAGNOSIS — Z8719 Personal history of other diseases of the digestive system: Secondary | ICD-10-CM | POA: Diagnosis not present

## 2019-11-10 DIAGNOSIS — E119 Type 2 diabetes mellitus without complications: Secondary | ICD-10-CM | POA: Diagnosis not present

## 2019-11-10 DIAGNOSIS — I10 Essential (primary) hypertension: Secondary | ICD-10-CM | POA: Diagnosis not present

## 2019-11-10 DIAGNOSIS — K922 Gastrointestinal hemorrhage, unspecified: Secondary | ICD-10-CM

## 2019-11-10 DIAGNOSIS — E7849 Other hyperlipidemia: Secondary | ICD-10-CM | POA: Diagnosis not present

## 2019-11-10 MED ORDER — ROSUVASTATIN CALCIUM 5 MG PO TABS: 5.0000 mg | ORAL_TABLET | Freq: Every day | ORAL | 1 refills | Status: DC

## 2019-11-10 MED ORDER — HYDROCHLOROTHIAZIDE 25 MG PO TABS: 25.0000 mg | ORAL_TABLET | Freq: Every day | ORAL | 1 refills | Status: DC

## 2019-11-10 MED ORDER — PANTOPRAZOLE SODIUM 40 MG PO TBEC: 40.0000 mg | DELAYED_RELEASE_TABLET | Freq: Every day | ORAL | 1 refills | Status: DC

## 2019-11-10 NOTE — Patient Instructions (Addendum)
  Blood work was ordered.     Medications reviewed and updated.  Changes include :   start crestor 5 mg daily  Your prescription(s) have been submitted to your pharmacy. Please take as directed and contact our office if you believe you are having problem(s) with the medication(s).     Please followup in 6 months

## 2019-11-10 NOTE — Progress Notes (Signed)
Subjective:    Patient ID: Johnny [name], male    DOB: 1950-10-03, 69 y.o.   MRN: [id number]  HPI The patient is here for follow up of their chronic medical problems, including diabetes, hypertension, hyperlipidemia, h/o GIB  He is taking all of his medications as prescribed. He was starting on metformin about six weeks by Dr Jenny Reichmann.  He has lost weight and has been much more compliant with a diabetic diet.     He is exercising regularly - goes to gym 2-3 days a week.      Has had some intermittent GERD.  Take protonix daily.  H/o GIB 2017.  He has a f/u EGD scheduled at [facility] in one week.     Medications and allergies reviewed with patient and updated if appropriate.  Patient Active Problem List   Diagnosis Date Noted  . Trigger finger 07/22/2019  . MCP subluxation, initial encounter 05/26/2018  . Biceps tendinitis on right 05/20/2017  . Cervical radiculopathy 03/25/2017  . Arthritis of carpometacarpal [facility]) joint of right thumb 12/15/2016  . Subluxation/dislocation ECU (extensor carpi ulnaris) tendon, left, initial encounter 09/25/2016  . Diabetes ([place]), diet controlled 06/22/2016  . H/O: GI bleed 10/28/2015  . Rosacea 04/16/2015  . Hyperlipidemia 01/09/2009  . PROSTATE CANCER, HX OF 01/09/2009  . Essential hypertension 08/02/2007  . ABNORMAL ELECTROCARDIOGRAM 08/02/2007    Current Outpatient Medications on File Prior to Visit  Medication Sig Dispense Refill  . b complex vitamins tablet Take 1 tablet by mouth daily.    . Cholecalciferol (VITAMIN D3) 50 MCG (2000 UT) capsule Take by mouth.    . gabapentin (NEURONTIN) 100 MG capsule TAKE 2 CAPSULES BY MOUTH AT BEDTIME 180 capsule 0  . hydrochlorothiazide (HYDRODIURIL) 25 MG tablet TAKE 1 TABLET BY MOUTH EVERY DAY 90 tablet 0  . losartan (COZAAR) 100 MG tablet TAKE 1 TABLET (100 MG TOTAL) BY MOUTH DAILY. 90 tablet 1  . metFORMIN (GLUCOPHAGE-XR) 500 MG 24 hr tablet Take 1 tablet (500 mg total) by mouth daily with  breakfast. 90 tablet 3  . metroNIDAZOLE (METROCREAM) 0.75 % cream     . Omega-3 Fatty Acids (OMEGA 3 PO) Take 1 tablet by mouth daily.     . pantoprazole (PROTONIX) 40 MG tablet TAKE 1 TABLET BY MOUTH TWICE A DAY (Patient taking differently: 40 mg daily. ) 180 tablet 0  . Sulfacetamide Sodium-Sulfur 9.8-4.8 % LIQD     . Vitamin D, Ergocalciferol, (DRISDOL) 1.25 MG (50000 UT) CAPS capsule TAKE 1 CAPSULE BY MOUTH ONE TIME PER WEEK 12 capsule 0   No current facility-administered medications on file prior to visit.    Past Medical History:  Diagnosis Date  . Hyperlipidemia    LDL goal = < 110  . Hypertension   . Prostate cancer [facility]) 2008   Dr Darcus Austin, [facility]    Past Surgical History:  Procedure Laterality Date  . CERVICAL FUSION  2007   C5-C7 , Dr Saintclair Halsted   . COLONOSCOPY  2006   negative; [place],   . ESOPHAGOGASTRODUODENOSCOPY Left 10/28/2015   Procedure: ESOPHAGOGASTRODUODENOSCOPY (EGD);  Surgeon: Teena Irani, MD;  Location: [facility];  Service: Endoscopy;  Laterality: Left;  . PROSTATECTOMY  11/2007   Dr.Moul, [facility]  . WRIST SURGERY  1998   Hammate bone resection 2006    Social History   Socioeconomic History  . Marital status: Married    Spouse name: Not on file  . Number of children: 3  . Years of education:  Not on file  . Highest education level: Not on file  Occupational History  . Occupation: Teacher/Coach  Tobacco Use  . Smoking status: Never Smoker  . Smokeless tobacco: Never Used  Substance and Sexual Activity  . Alcohol use: No  . Drug use: No  . Sexual activity: Not on file  Other Topics Concern  . Not on file  Social History Narrative  . Not on file   Social Determinants of Health   Financial Resource Strain:   . Difficulty of Paying Living Expenses: Not on file  Food Insecurity:   . Worried About Charity fundraiser in the Last Year: Not on file  . Ran Out of Food in the Last Year: Not on file  Transportation Needs:   . Lack of Transportation  (Medical): Not on file  . Lack of Transportation (Non-Medical): Not on file  Physical Activity:   . Days of Exercise per Week: Not on file  . Minutes of Exercise per Session: Not on file  Stress:   . Feeling of Stress : Not on file  Social Connections:   . Frequency of Communication with Friends and Family: Not on file  . Frequency of Social Gatherings with Friends and Family: Not on file  . Attends Religious Services: Not on file  . Active Member of Clubs or Organizations: Not on file  . Attends Archivist Meetings: Not on file  . Marital Status: Not on file    Family History  Problem Relation Age of Onset  . Breast cancer Mother   . Alzheimer's disease Mother   . Colon cancer Paternal Grandmother   . Diabetes Paternal Grandfather   . Heart attack Paternal Grandfather 92  . Breast cancer Maternal Grandmother   . Heart attack Maternal Grandfather 72  . Heart disease Father        Atrial fibrillation  . Heart failure Father   . Stroke Neg Hx     Review of Systems  Constitutional: Negative for chills and fever.  Eyes: Negative for visual disturbance.  Respiratory: Negative for cough, shortness of breath and wheezing.   Cardiovascular: Positive for palpitations (occ). Negative for chest pain and leg swelling.  Neurological: Negative for dizziness, light-headedness and headaches.       Objective:   Vitals:   11/10/19 0833  BP: 126/74  Pulse: 73  Resp: 16  Temp: 98.4 F (36.9 C)  SpO2: 98%   BP Readings from Last 3 Encounters:  11/10/19 126/74  09/29/19 (!) 142/86  07/22/19 110/64   Wt Readings from Last 3 Encounters:  11/10/19 218 lb (98.9 kg)  09/29/19 227 lb (103 kg)  07/22/19 236 lb (107 kg)   Body mass index is 31.28 kg/m.   Physical Exam    Constitutional: Appears well-developed and well-nourished. No distress.  HENT:  Head: Normocephalic and atraumatic.  Neck: Neck supple. No tracheal deviation present. No thyromegaly present.  No  cervical lymphadenopathy Cardiovascular: Normal rate, regular rhythm and normal heart sounds.   No murmur heard. No carotid bruit .  No edema Pulmonary/Chest: Effort normal and breath sounds normal. No respiratory distress. No has no wheezes. No rales.  Skin: Skin is warm and dry. Not diaphoretic.  Psychiatric: Normal mood and affect. Behavior is normal.      Assessment & Plan:    Blood work ordered  - to be done in two months  See Problem List for Assessment and Plan of chronic medical problems.    This visit  occurred during the SARS-CoV-2 public health emergency.  Safety protocols were in place, including screening questions prior to the visit, additional usage of staff PPE, and extensive cleaning of exam room while observing appropriate contact time as indicated for disinfecting solutions.

## 2019-11-10 NOTE — Assessment & Plan Note (Addendum)
Chronic a1c 10.4 on 09/28/19 Started on metformin six weeks ago Much more compliant with a diabetic diet and has lost weight Expect sugars to be much controlled after a couple of months a1c in about 2 months

## 2019-11-10 NOTE — Assessment & Plan Note (Signed)
Chronic Start crestor 5 mg daily Check lipids, cmp in 2 months

## 2019-11-10 NOTE — Assessment & Plan Note (Signed)
Chronic BP well controlled Current regimen effective and well tolerated Continue current medications at current doses cmp  

## 2019-11-10 NOTE — Assessment & Plan Note (Signed)
Having increased GERD recently H/o UGIB Scheduled for EGD next week Continue protonix daily

## 2019-11-14 DIAGNOSIS — Z1159 Encounter for screening for other viral diseases: Secondary | ICD-10-CM | POA: Diagnosis not present

## 2019-11-16 DIAGNOSIS — M545 Low back pain: Secondary | ICD-10-CM | POA: Diagnosis not present

## 2019-11-16 DIAGNOSIS — M9902 Segmental and somatic dysfunction of thoracic region: Secondary | ICD-10-CM | POA: Diagnosis not present

## 2019-11-16 DIAGNOSIS — M5383 Other specified dorsopathies, cervicothoracic region: Secondary | ICD-10-CM | POA: Diagnosis not present

## 2019-11-16 DIAGNOSIS — M9901 Segmental and somatic dysfunction of cervical region: Secondary | ICD-10-CM | POA: Diagnosis not present

## 2019-11-16 DIAGNOSIS — M546 Pain in thoracic spine: Secondary | ICD-10-CM | POA: Diagnosis not present

## 2019-11-16 DIAGNOSIS — M9903 Segmental and somatic dysfunction of lumbar region: Secondary | ICD-10-CM | POA: Diagnosis not present

## 2019-11-17 DIAGNOSIS — K317 Polyp of stomach and duodenum: Secondary | ICD-10-CM | POA: Diagnosis not present

## 2019-11-17 DIAGNOSIS — K2270 Barrett's esophagus without dysplasia: Secondary | ICD-10-CM | POA: Diagnosis not present

## 2019-11-17 DIAGNOSIS — K219 Gastro-esophageal reflux disease without esophagitis: Secondary | ICD-10-CM | POA: Diagnosis not present

## 2019-11-17 DIAGNOSIS — K449 Diaphragmatic hernia without obstruction or gangrene: Secondary | ICD-10-CM | POA: Diagnosis not present

## 2019-11-18 ENCOUNTER — Other Ambulatory Visit: Payer: Self-pay | Admitting: Family Medicine

## 2019-11-23 DIAGNOSIS — M545 Low back pain: Secondary | ICD-10-CM | POA: Diagnosis not present

## 2019-11-23 DIAGNOSIS — M9901 Segmental and somatic dysfunction of cervical region: Secondary | ICD-10-CM | POA: Diagnosis not present

## 2019-11-23 DIAGNOSIS — K2270 Barrett's esophagus without dysplasia: Secondary | ICD-10-CM | POA: Diagnosis not present

## 2019-11-23 DIAGNOSIS — M9902 Segmental and somatic dysfunction of thoracic region: Secondary | ICD-10-CM | POA: Diagnosis not present

## 2019-11-23 DIAGNOSIS — M5383 Other specified dorsopathies, cervicothoracic region: Secondary | ICD-10-CM | POA: Diagnosis not present

## 2019-11-23 DIAGNOSIS — M9903 Segmental and somatic dysfunction of lumbar region: Secondary | ICD-10-CM | POA: Diagnosis not present

## 2019-11-23 DIAGNOSIS — K317 Polyp of stomach and duodenum: Secondary | ICD-10-CM | POA: Diagnosis not present

## 2019-11-23 DIAGNOSIS — M546 Pain in thoracic spine: Secondary | ICD-10-CM | POA: Diagnosis not present

## 2019-11-26 ENCOUNTER — Other Ambulatory Visit: Payer: Self-pay | Admitting: Internal Medicine

## 2019-11-26 DIAGNOSIS — K922 Gastrointestinal hemorrhage, unspecified: Secondary | ICD-10-CM

## 2019-11-30 DIAGNOSIS — M546 Pain in thoracic spine: Secondary | ICD-10-CM | POA: Diagnosis not present

## 2019-11-30 DIAGNOSIS — M9901 Segmental and somatic dysfunction of cervical region: Secondary | ICD-10-CM | POA: Diagnosis not present

## 2019-11-30 DIAGNOSIS — M5383 Other specified dorsopathies, cervicothoracic region: Secondary | ICD-10-CM | POA: Diagnosis not present

## 2019-11-30 DIAGNOSIS — M9902 Segmental and somatic dysfunction of thoracic region: Secondary | ICD-10-CM | POA: Diagnosis not present

## 2019-11-30 DIAGNOSIS — M9903 Segmental and somatic dysfunction of lumbar region: Secondary | ICD-10-CM | POA: Diagnosis not present

## 2019-11-30 DIAGNOSIS — M545 Low back pain: Secondary | ICD-10-CM | POA: Diagnosis not present

## 2019-12-01 ENCOUNTER — Other Ambulatory Visit: Payer: Self-pay | Admitting: Family Medicine

## 2019-12-08 DIAGNOSIS — M9903 Segmental and somatic dysfunction of lumbar region: Secondary | ICD-10-CM | POA: Diagnosis not present

## 2019-12-08 DIAGNOSIS — M545 Low back pain: Secondary | ICD-10-CM | POA: Diagnosis not present

## 2019-12-08 DIAGNOSIS — M9901 Segmental and somatic dysfunction of cervical region: Secondary | ICD-10-CM | POA: Diagnosis not present

## 2019-12-08 DIAGNOSIS — M9902 Segmental and somatic dysfunction of thoracic region: Secondary | ICD-10-CM | POA: Diagnosis not present

## 2019-12-08 DIAGNOSIS — M546 Pain in thoracic spine: Secondary | ICD-10-CM | POA: Diagnosis not present

## 2019-12-08 DIAGNOSIS — M5383 Other specified dorsopathies, cervicothoracic region: Secondary | ICD-10-CM | POA: Diagnosis not present

## 2019-12-13 ENCOUNTER — Telehealth: Payer: Self-pay | Admitting: Internal Medicine

## 2019-12-13 NOTE — Progress Notes (Signed)
  Chronic Care Management   Outreach Note  12/13/2019 Name: Johnny [name] MRN: [id number] DOB: March 10, 1951  Referred by: Binnie Rail, MD Reason for referral : No chief complaint on file.   An unsuccessful telephone outreach was attempted today. The patient was referred to the pharmacist for assistance with care management and care coordination.   Follow Up Plan:   Raynicia Dukes [employer] Scheduler

## 2019-12-22 DIAGNOSIS — M545 Low back pain: Secondary | ICD-10-CM | POA: Diagnosis not present

## 2019-12-22 DIAGNOSIS — M9902 Segmental and somatic dysfunction of thoracic region: Secondary | ICD-10-CM | POA: Diagnosis not present

## 2019-12-22 DIAGNOSIS — M5383 Other specified dorsopathies, cervicothoracic region: Secondary | ICD-10-CM | POA: Diagnosis not present

## 2019-12-22 DIAGNOSIS — M546 Pain in thoracic spine: Secondary | ICD-10-CM | POA: Diagnosis not present

## 2019-12-22 DIAGNOSIS — M9903 Segmental and somatic dysfunction of lumbar region: Secondary | ICD-10-CM | POA: Diagnosis not present

## 2019-12-22 DIAGNOSIS — M9901 Segmental and somatic dysfunction of cervical region: Secondary | ICD-10-CM | POA: Diagnosis not present

## 2019-12-23 DIAGNOSIS — C61 Malignant neoplasm of prostate: Secondary | ICD-10-CM | POA: Diagnosis not present

## 2019-12-23 DIAGNOSIS — N5203 Combined arterial insufficiency and corporo-venous occlusive erectile dysfunction: Secondary | ICD-10-CM | POA: Diagnosis not present

## 2019-12-26 ENCOUNTER — Telehealth: Payer: Self-pay | Admitting: Internal Medicine

## 2019-12-26 NOTE — Progress Notes (Signed)
°  Chronic Care Management   Outreach Note  12/26/2019 Name: Johnny [name] MRN: [id number] DOB: 30-Jul-1951  Referred by: Binnie Rail, MD Reason for referral : No chief complaint on file.   A second unsuccessful telephone outreach was attempted today. The patient was referred to pharmacist for assistance with care management and care coordination.  Follow Up Plan:    Chronic Care Management   Outreach Note  12/26/2019 Name: Johnny [name] MRN: [id number] DOB: 1951-09-07  Referred by: Binnie Rail, MD Reason for referral : No chief complaint on file.   A second unsuccessful telephone outreach was attempted today. The patient was referred to pharmacist for assistance with care management and care coordination.  Follow Up Plan:   Raynicia Dukes [employer] Scheduler

## 2020-01-03 DIAGNOSIS — M9902 Segmental and somatic dysfunction of thoracic region: Secondary | ICD-10-CM | POA: Diagnosis not present

## 2020-01-03 DIAGNOSIS — M9901 Segmental and somatic dysfunction of cervical region: Secondary | ICD-10-CM | POA: Diagnosis not present

## 2020-01-03 DIAGNOSIS — M546 Pain in thoracic spine: Secondary | ICD-10-CM | POA: Diagnosis not present

## 2020-01-03 DIAGNOSIS — M9903 Segmental and somatic dysfunction of lumbar region: Secondary | ICD-10-CM | POA: Diagnosis not present

## 2020-01-03 DIAGNOSIS — M5383 Other specified dorsopathies, cervicothoracic region: Secondary | ICD-10-CM | POA: Diagnosis not present

## 2020-01-03 DIAGNOSIS — M545 Low back pain: Secondary | ICD-10-CM | POA: Diagnosis not present

## 2020-01-09 ENCOUNTER — Other Ambulatory Visit: Payer: Medicare Other

## 2020-01-20 ENCOUNTER — Encounter: Payer: Self-pay | Admitting: Internal Medicine

## 2020-01-24 DIAGNOSIS — M9905 Segmental and somatic dysfunction of pelvic region: Secondary | ICD-10-CM | POA: Diagnosis not present

## 2020-01-24 DIAGNOSIS — M9901 Segmental and somatic dysfunction of cervical region: Secondary | ICD-10-CM | POA: Diagnosis not present

## 2020-01-24 DIAGNOSIS — M9902 Segmental and somatic dysfunction of thoracic region: Secondary | ICD-10-CM | POA: Diagnosis not present

## 2020-01-24 DIAGNOSIS — M545 Low back pain: Secondary | ICD-10-CM | POA: Diagnosis not present

## 2020-01-24 DIAGNOSIS — M9903 Segmental and somatic dysfunction of lumbar region: Secondary | ICD-10-CM | POA: Diagnosis not present

## 2020-02-02 ENCOUNTER — Other Ambulatory Visit: Payer: Self-pay | Admitting: Family Medicine

## 2020-02-02 ENCOUNTER — Other Ambulatory Visit: Payer: Self-pay | Admitting: Internal Medicine

## 2020-02-02 DIAGNOSIS — I10 Essential (primary) hypertension: Secondary | ICD-10-CM

## 2020-02-08 DIAGNOSIS — M9901 Segmental and somatic dysfunction of cervical region: Secondary | ICD-10-CM | POA: Diagnosis not present

## 2020-02-08 DIAGNOSIS — M9903 Segmental and somatic dysfunction of lumbar region: Secondary | ICD-10-CM | POA: Diagnosis not present

## 2020-02-08 DIAGNOSIS — M9905 Segmental and somatic dysfunction of pelvic region: Secondary | ICD-10-CM | POA: Diagnosis not present

## 2020-02-08 DIAGNOSIS — M9902 Segmental and somatic dysfunction of thoracic region: Secondary | ICD-10-CM | POA: Diagnosis not present

## 2020-02-24 ENCOUNTER — Telehealth: Payer: Self-pay | Admitting: Internal Medicine

## 2020-02-24 NOTE — Progress Notes (Signed)
  Chronic Care Management   Outreach Note  02/24/2020 Name: Johnny [name] MRN: [id number] DOB: Dec 21, 1950  Referred by: Binnie Rail, MD Reason for referral : No chief complaint on file.   An unsuccessful telephone outreach was attempted today. The patient was referred to the pharmacist for assistance with care management and care coordination.   This note is not being shared with the patient for the following reason: To respect privacy (The patient or proxy has requested that the information not be shared).  Follow Up Plan:   [name] [employer] Scheduler

## 2020-04-23 ENCOUNTER — Other Ambulatory Visit: Payer: Self-pay | Admitting: Family Medicine

## 2020-04-26 DIAGNOSIS — L820 Inflamed seborrheic keratosis: Secondary | ICD-10-CM | POA: Diagnosis not present

## 2020-04-26 DIAGNOSIS — L719 Rosacea, unspecified: Secondary | ICD-10-CM | POA: Diagnosis not present

## 2020-04-26 DIAGNOSIS — L578 Other skin changes due to chronic exposure to nonionizing radiation: Secondary | ICD-10-CM | POA: Diagnosis not present

## 2020-04-26 DIAGNOSIS — L821 Other seborrheic keratosis: Secondary | ICD-10-CM | POA: Diagnosis not present

## 2020-04-26 DIAGNOSIS — L570 Actinic keratosis: Secondary | ICD-10-CM | POA: Diagnosis not present

## 2020-04-30 ENCOUNTER — Other Ambulatory Visit: Payer: Self-pay | Admitting: Internal Medicine

## 2020-04-30 DIAGNOSIS — I10 Essential (primary) hypertension: Secondary | ICD-10-CM

## 2020-05-03 ENCOUNTER — Other Ambulatory Visit: Payer: Self-pay | Admitting: Internal Medicine

## 2020-05-09 ENCOUNTER — Other Ambulatory Visit: Payer: Self-pay | Admitting: Internal Medicine

## 2020-05-10 ENCOUNTER — Ambulatory Visit: Payer: BC Managed Care – PPO

## 2020-05-10 ENCOUNTER — Encounter: Payer: Self-pay | Admitting: Internal Medicine

## 2020-05-10 NOTE — Progress Notes (Signed)
Subjective:    Patient ID: Johnny [name], male    DOB: [birth year], 69 y.o.   MRN: [id number]  HPI The patient is here for follow up of their chronic medical problems, including DM, Htn, hyperlipidemia, h/o GIB  He is taking all of his medications as prescribed.    He is exercising regularly.   Golf and gym 2-3 times a week.       Medications and allergies reviewed with patient and updated if appropriate.  Patient Active Problem List   Diagnosis Date Noted  . Trigger finger 07/22/2019  . MCP subluxation, initial encounter 05/26/2018  . Biceps tendinitis on right 05/20/2017  . Cervical radiculopathy 03/25/2017  . Arthritis of carpometacarpal [facility]) joint of right thumb 12/15/2016  . Subluxation/dislocation ECU (extensor carpi ulnaris) tendon, left, initial encounter 09/25/2016  . Diabetes ([place]) 06/22/2016  . H/O: GI bleed 10/28/2015  . Rosacea 04/16/2015  . Hyperlipidemia 01/09/2009  . PROSTATE CANCER, HX OF 01/09/2009  . Essential hypertension 08/02/2007  . ABNORMAL ELECTROCARDIOGRAM 08/02/2007    Current Outpatient Medications on File Prior to Visit  Medication Sig Dispense Refill  . b complex vitamins tablet Take 1 tablet by mouth daily.    . Cholecalciferol (VITAMIN D3) 50 MCG (2000 UT) capsule Take by mouth.    . gabapentin (NEURONTIN) 100 MG capsule TAKE 2 CAPSULES BY MOUTH AT BEDTIME 180 capsule 0  . hydrochlorothiazide (HYDRODIURIL) 25 MG tablet TAKE 1 TABLET BY MOUTH EVERY DAY 90 tablet 1  . losartan (COZAAR) 100 MG tablet TAKE 1 TABLET (100 MG TOTAL) BY MOUTH DAILY. 90 tablet 0  . metFORMIN (GLUCOPHAGE-XR) 500 MG 24 hr tablet Take 1 tablet (500 mg total) by mouth daily with breakfast. 90 tablet 3  . metroNIDAZOLE (METROCREAM) 0.75 % cream     . Omega-3 Fatty Acids (OMEGA 3 PO) Take 1 tablet by mouth daily.     . pantoprazole (PROTONIX) 40 MG tablet TAKE 1 TABLET BY MOUTH TWICE A DAY 180 tablet 1  . rosuvastatin (CRESTOR) 5 MG tablet TAKE 1 TABLET BY MOUTH  EVERY DAY 90 tablet 1  . Sulfacetamide Sodium-Sulfur 9.8-4.8 % LIQD     . Vitamin D, Ergocalciferol, (DRISDOL) 1.25 MG (50000 UNIT) CAPS capsule TAKE 1 CAPSULE BY MOUTH ONE TIME PER WEEK 12 capsule 0   No current facility-administered medications on file prior to visit.    Past Medical History:  Diagnosis Date  . Hyperlipidemia    LDL goal = < 110  . Hypertension   . Prostate cancer [facility]) 2008   Dr Darcus Austin, [facility]    Past Surgical History:  Procedure Laterality Date  . CERVICAL FUSION  2007   C5-C7 , Dr Saintclair Halsted   . COLONOSCOPY  2006   negative; [place], [place]  . ESOPHAGOGASTRODUODENOSCOPY Left 10/28/2015   Procedure: ESOPHAGOGASTRODUODENOSCOPY (EGD);  Surgeon: Teena Irani, MD;  Location: [facility];  Service: Endoscopy;  Laterality: Left;  . PROSTATECTOMY  11/2007   Dr.Moul, [facility]  . WRIST SURGERY  1998   [place] bone resection 2006    Social History   Socioeconomic History  . Marital status: Married    Spouse name: Not on file  . Number of children: 3  . Years of education: Not on file  . Highest education level: Not on file  Occupational History  . Occupation: Teacher/Coach  Tobacco Use  . Smoking status: Never Smoker  . Smokeless tobacco: Never Used  Substance and Sexual Activity  . Alcohol use: No  . Drug  use: No  . Sexual activity: Not on file  Other Topics Concern  . Not on file  Social History Narrative  . Not on file   Social Determinants of Health   Financial Resource Strain:   . Difficulty of Paying Living Expenses: Not on file  Food Insecurity:   . Worried About Charity fundraiser in the Last Year: Not on file  . Ran Out of Food in the Last Year: Not on file  Transportation Needs:   . Lack of Transportation (Medical): Not on file  . Lack of Transportation (Non-Medical): Not on file  Physical Activity:   . Days of Exercise per Week: Not on file  . Minutes of Exercise per Session: Not on file  Stress:   . Feeling of Stress : Not on file  Social  Connections:   . Frequency of Communication with Friends and Family: Not on file  . Frequency of Social Gatherings with Friends and Family: Not on file  . Attends Religious Services: Not on file  . Active Member of Clubs or Organizations: Not on file  . Attends Archivist Meetings: Not on file  . Marital Status: Not on file    Family History  Problem Relation Age of Onset  . Breast cancer Mother   . Alzheimer's disease Mother   . Colon cancer Paternal Grandmother   . Diabetes Paternal Grandfather   . Heart attack Paternal Grandfather 72  . Breast cancer Maternal Grandmother   . Heart attack Maternal Grandfather 72  . Heart disease Father        Atrial fibrillation  . Heart failure Father   . Stroke Neg Hx     Review of Systems  Constitutional: Negative for chills and fever.  Respiratory: Negative for cough, shortness of breath and wheezing.   Cardiovascular: Negative for chest pain, palpitations and leg swelling.  Gastrointestinal: Negative for abdominal pain, blood in stool (no melena) and nausea.       No gerd  Neurological: Negative for light-headedness, numbness and headaches.       Objective:   Vitals:   05/11/20 0805  BP: 126/84  Pulse: 67  Temp: 98.2 F (36.8 C)  SpO2: 98%   BP Readings from Last 3 Encounters:  05/11/20 126/84  11/10/19 126/74  09/29/19 (!) 142/86   Wt Readings from Last 3 Encounters:  05/11/20 214 lb (97.1 kg)  11/10/19 218 lb (98.9 kg)  09/29/19 227 lb (103 kg)   Body mass index is 30.71 kg/m.   Physical Exam    Constitutional: Appears well-developed and well-nourished. No distress.  HENT:  Head: Normocephalic and atraumatic.  Neck: Neck supple. No tracheal deviation present. No thyromegaly present.  No cervical lymphadenopathy Cardiovascular: Normal rate, regular rhythm and normal heart sounds.   No murmur heard. No carotid bruit .  No edema Pulmonary/Chest: Effort normal and breath sounds normal. No respiratory  distress. No has no wheezes. No rales.  Skin: Skin is warm and dry. Not diaphoretic.  Psychiatric: Normal mood and affect. Behavior is normal.      Assessment & Plan:    See Problem List for Assessment and Plan of chronic medical problems.    This visit occurred during the SARS-CoV-2 public health emergency.  Safety protocols were in place, including screening questions prior to the visit, additional usage of staff PPE, and extensive cleaning of exam room while observing appropriate contact time as indicated for disinfecting solutions.

## 2020-05-10 NOTE — Patient Instructions (Addendum)
°  Blood work was ordered.   ° ° °Medications reviewed and updated.  Changes include :   none ° ° ° °Please followup in 6 months ° ° °

## 2020-05-11 ENCOUNTER — Other Ambulatory Visit: Payer: Self-pay

## 2020-05-11 ENCOUNTER — Ambulatory Visit (INDEPENDENT_AMBULATORY_CARE_PROVIDER_SITE_OTHER): Payer: Medicare Other | Admitting: Internal Medicine

## 2020-05-11 ENCOUNTER — Encounter: Payer: Self-pay | Admitting: Internal Medicine

## 2020-05-11 VITALS — BP 126/84 | HR 67 | Temp 98.2°F | Ht 70.0 in | Wt 214.0 lb

## 2020-05-11 DIAGNOSIS — Z8719 Personal history of other diseases of the digestive system: Secondary | ICD-10-CM | POA: Diagnosis not present

## 2020-05-11 DIAGNOSIS — E119 Type 2 diabetes mellitus without complications: Secondary | ICD-10-CM

## 2020-05-11 DIAGNOSIS — I10 Essential (primary) hypertension: Secondary | ICD-10-CM

## 2020-05-11 DIAGNOSIS — E7849 Other hyperlipidemia: Secondary | ICD-10-CM | POA: Diagnosis not present

## 2020-05-11 NOTE — Assessment & Plan Note (Signed)
Chronic BP well controlled Current regimen effective and well tolerated Continue current medications at current doses cmp  

## 2020-05-11 NOTE — Assessment & Plan Note (Signed)
Chronic - h/o of GIB EGD earlier this year No GERD or concerning symptoms continue pantoprazole

## 2020-05-11 NOTE — Assessment & Plan Note (Signed)
Chronic Check lipid panel  Continue daily statin Regular exercise and healthy diet encouraged  

## 2020-05-11 NOTE — Assessment & Plan Note (Signed)
Chronic Check a1c Low sugar / carb diet Continue regular exercise Continue current medications

## 2020-05-12 LAB — COMPLETE METABOLIC PANEL WITH GFR
AG Ratio: 2 (calc) (ref 1.0–2.5)
ALT: 27 U/L (ref 9–46)
AST: 22 U/L (ref 10–35)
Albumin: 4.3 g/dL (ref 3.6–5.1)
Alkaline phosphatase (APISO): 55 U/L (ref 35–144)
BUN: 16 mg/dL (ref 7–25)
CO2: 27 mmol/L (ref 20–32)
Calcium: 9.6 mg/dL (ref 8.6–10.3)
Chloride: 104 mmol/L (ref 98–110)
Creat: 0.94 mg/dL (ref 0.70–1.25)
GFR, Est African American: 95 mL/min/{1.73_m2} (ref >=60)
GFR, Est Non African American: 82 mL/min/{1.73_m2} (ref >=60)
Globulin: 2.2 g/dL (calc) (ref 1.9–3.7)
Glucose, Bld: 148 mg/dL — ABNORMAL HIGH (ref 65–99)
Potassium: 4 mmol/L (ref 3.5–5.3)
Sodium: 139 mmol/L (ref 135–146)
Total Bilirubin: 0.8 mg/dL (ref 0.2–1.2)
Total Protein: 6.5 g/dL (ref 6.1–8.1)

## 2020-05-12 LAB — CBC WITH DIFFERENTIAL/PLATELET
Absolute Monocytes: 690 cells/uL (ref 200–950)
Basophils Absolute: 7 cells/uL (ref 0–200)
Basophils Relative: 0.1 %
Eosinophils Absolute: 141 cells/uL (ref 15–500)
Eosinophils Relative: 2.1 %
HCT: 43.9 % (ref 38.5–50.0)
Hemoglobin: 14.2 g/dL (ref 13.2–17.1)
Lymphs Abs: 1615 cells/uL (ref 850–3900)
MCH: 28.5 pg (ref 27.0–33.0)
MCHC: 32.3 g/dL (ref 32.0–36.0)
MCV: 88.2 fL (ref 80.0–100.0)
MPV: 12 fL (ref 7.5–12.5)
Monocytes Relative: 10.3 %
Neutro Abs: 4248 cells/uL (ref 1500–7800)
Neutrophils Relative %: 63.4 %
Platelets: 268 10*3/uL (ref 140–400)
RBC: 4.98 10*6/uL (ref 4.20–5.80)
RDW: 13.4 % (ref 11.0–15.0)
Total Lymphocyte: 24.1 %
WBC: 6.7 10*3/uL (ref 3.8–10.8)

## 2020-05-12 LAB — HEMOGLOBIN A1C
Hgb A1c MFr Bld: 6.9 % of total Hgb — ABNORMAL HIGH (ref <5.7)
Mean Plasma Glucose: 151 (calc)
eAG (mmol/L): 8.4 (calc)

## 2020-05-12 LAB — LIPID PANEL
Cholesterol: 120 mg/dL (ref <200)
HDL: 42 mg/dL (ref >=40)
LDL Cholesterol (Calc): 55 mg/dL (calc)
Non-HDL Cholesterol (Calc): 78 mg/dL (calc) (ref <130)
Total CHOL/HDL Ratio: 2.9 (calc) (ref <5.0)
Triglycerides: 150 mg/dL — ABNORMAL HIGH (ref <150)

## 2020-05-16 ENCOUNTER — Other Ambulatory Visit: Payer: Self-pay | Admitting: Family Medicine

## 2020-05-22 NOTE — Progress Notes (Signed)
This encounter was created in error - please disregard.

## 2020-06-22 DIAGNOSIS — K317 Polyp of stomach and duodenum: Secondary | ICD-10-CM | POA: Diagnosis not present

## 2020-06-22 DIAGNOSIS — K219 Gastro-esophageal reflux disease without esophagitis: Secondary | ICD-10-CM | POA: Diagnosis not present

## 2020-06-22 DIAGNOSIS — Z1211 Encounter for screening for malignant neoplasm of colon: Secondary | ICD-10-CM | POA: Diagnosis not present

## 2020-06-22 DIAGNOSIS — K2270 Barrett's esophagus without dysplasia: Secondary | ICD-10-CM | POA: Diagnosis not present

## 2020-06-29 DIAGNOSIS — K4090 Unilateral inguinal hernia, without obstruction or gangrene, not specified as recurrent: Secondary | ICD-10-CM | POA: Diagnosis not present

## 2020-06-29 DIAGNOSIS — N5203 Combined arterial insufficiency and corporo-venous occlusive erectile dysfunction: Secondary | ICD-10-CM | POA: Diagnosis not present

## 2020-06-29 DIAGNOSIS — C61 Malignant neoplasm of prostate: Secondary | ICD-10-CM | POA: Diagnosis not present

## 2020-07-08 ENCOUNTER — Other Ambulatory Visit: Payer: Self-pay | Admitting: Internal Medicine

## 2020-07-08 ENCOUNTER — Other Ambulatory Visit: Payer: Self-pay | Admitting: Family Medicine

## 2020-07-08 DIAGNOSIS — I10 Essential (primary) hypertension: Secondary | ICD-10-CM

## 2020-07-31 DIAGNOSIS — K4090 Unilateral inguinal hernia, without obstruction or gangrene, not specified as recurrent: Secondary | ICD-10-CM | POA: Diagnosis not present

## 2020-08-16 ENCOUNTER — Other Ambulatory Visit: Payer: Self-pay | Admitting: Family Medicine

## 2020-08-20 DIAGNOSIS — R0981 Nasal congestion: Secondary | ICD-10-CM | POA: Diagnosis not present

## 2020-08-20 DIAGNOSIS — R051 Acute cough: Secondary | ICD-10-CM | POA: Diagnosis not present

## 2020-08-20 DIAGNOSIS — Z20828 Contact with and (suspected) exposure to other viral communicable diseases: Secondary | ICD-10-CM | POA: Diagnosis not present

## 2020-08-20 DIAGNOSIS — J069 Acute upper respiratory infection, unspecified: Secondary | ICD-10-CM | POA: Diagnosis not present

## 2020-09-04 ENCOUNTER — Telehealth: Payer: Self-pay | Admitting: Internal Medicine

## 2020-09-04 NOTE — Telephone Encounter (Signed)
Left message for patient to call back and schedule Medicare Annual Wellness Visit (AWV) with Nurse Health Advisor. This can be scheduled IN OFFICE or VIRTUAL/TELEPHONE VISIT.  This should be a 45 minute visit  Last AWV 12/31/18

## 2020-09-18 ENCOUNTER — Other Ambulatory Visit: Payer: Self-pay | Admitting: Internal Medicine

## 2020-09-18 NOTE — Telephone Encounter (Signed)
Please refill as per office routine med refill policy (all routine meds refilled for 3 mo or monthly per pt preference up to one year from last visit, then month to month grace period for 3 mo, then further med refills will have to be denied)  

## 2020-09-27 ENCOUNTER — Other Ambulatory Visit: Payer: Self-pay | Admitting: Family Medicine

## 2020-09-27 NOTE — Telephone Encounter (Signed)
Sent patient MyChart message regarding labs and PCP taking over rx.

## 2020-10-02 NOTE — Telephone Encounter (Signed)
Would recommend that he take 2000 units of otc vitamin d daily.  He no longer needs the high dose

## 2020-10-02 NOTE — Telephone Encounter (Signed)
Left patient voicemail regarding switching medication to PCP since we have not seen him since 2020.

## 2020-10-03 NOTE — Telephone Encounter (Signed)
Spoke with patient and info given 

## 2020-10-04 ENCOUNTER — Other Ambulatory Visit: Payer: Self-pay | Admitting: Internal Medicine

## 2020-10-04 DIAGNOSIS — K922 Gastrointestinal hemorrhage, unspecified: Secondary | ICD-10-CM

## 2020-10-27 ENCOUNTER — Other Ambulatory Visit: Payer: Self-pay | Admitting: Internal Medicine

## 2020-10-28 ENCOUNTER — Other Ambulatory Visit: Payer: Self-pay | Admitting: Internal Medicine

## 2020-11-09 ENCOUNTER — Other Ambulatory Visit: Payer: Self-pay

## 2020-11-11 NOTE — Patient Instructions (Addendum)
    Blood work was ordered.      Medications changes include :   none     Please followup in 6 months  

## 2020-11-11 NOTE — Progress Notes (Signed)
Subjective:    Patient ID: Johnny [name], male    DOB: June 10, 1951, 70 y.o.   MRN: [id number]  HPI The patient is here for follow up of their chronic medical problems, including DM, htn, hyperlipidemia, h/o GIB  He goes to the gym 2-3 times a week.    Medications and allergies reviewed with patient and updated if appropriate.  Patient Active Problem List   Diagnosis Date Noted  . Trigger finger 07/22/2019  . MCP subluxation, initial encounter 05/26/2018  . Biceps tendinitis on right 05/20/2017  . Cervical radiculopathy 03/25/2017  . Arthritis of carpometacarpal [facility]) joint of right thumb 12/15/2016  . Subluxation/dislocation ECU (extensor carpi ulnaris) tendon, left, initial encounter 09/25/2016  . Diabetes ([place]) 06/22/2016  . H/O: GI bleed 10/28/2015  . Rosacea 04/16/2015  . Hyperlipidemia 01/09/2009  . PROSTATE CANCER, HX OF 01/09/2009  . Essential hypertension 08/02/2007  . ABNORMAL ELECTROCARDIOGRAM 08/02/2007    Current Outpatient Medications on File Prior to Visit  Medication Sig Dispense Refill  . b complex vitamins tablet Take 1 tablet by mouth daily.    . Cholecalciferol (VITAMIN D3) 50 MCG (2000 UT) capsule Take by mouth.    . gabapentin (NEURONTIN) 100 MG capsule Take 2 capsules (200 mg total) by mouth at bedtime. NEEDS APPT FOR FUTURE REFILLS 180 capsule 0  . hydrochlorothiazide (HYDRODIURIL) 25 MG tablet TAKE 1 TABLET BY MOUTH EVERY DAY 90 tablet 1  . losartan (COZAAR) 100 MG tablet TAKE 1 TABLET (100 MG TOTAL) BY MOUTH DAILY. 90 tablet 1  . metFORMIN (GLUCOPHAGE-XR) 500 MG 24 hr tablet TAKE 1 TABLET BY MOUTH EVERY DAY WITH BREAKFAST 90 tablet 3  . metroNIDAZOLE (METROCREAM) 0.75 % cream     . Omega-3 Fatty Acids (OMEGA 3 PO) Take 1 tablet by mouth daily.     . pantoprazole (PROTONIX) 40 MG tablet TAKE 1 TABLET BY MOUTH TWICE A DAY 180 tablet 1  . rosuvastatin (CRESTOR) 5 MG tablet TAKE 1 TABLET BY MOUTH EVERY DAY 90 tablet 1  . Sulfacetamide Sodium-Sulfur  9.8-4.8 % LIQD      No current facility-administered medications on file prior to visit.    Past Medical History:  Diagnosis Date  . Hyperlipidemia    LDL goal = < 110  . Hypertension   . Prostate cancer [facility]) 2008   Dr Darcus Austin, [facility]    Past Surgical History:  Procedure Laterality Date  . CERVICAL FUSION  2007   C5-C7 , Dr Saintclair Halsted   . COLONOSCOPY  2006   negative; [place], [place]  . ESOPHAGOGASTRODUODENOSCOPY Left 10/28/2015   Procedure: ESOPHAGOGASTRODUODENOSCOPY (EGD);  Surgeon: Teena Irani, MD;  Location: [facility];  Service: Endoscopy;  Laterality: Left;  . PROSTATECTOMY  11/2007   Dr.Moul, [facility]  . WRIST SURGERY  1998   [place] bone resection 2006    Social History   Socioeconomic History  . Marital status: Married    Spouse name: Not on file  . Number of children: 3  . Years of education: Not on file  . Highest education level: Not on file  Occupational History  . Occupation: Teacher/Coach  Tobacco Use  . Smoking status: Never Smoker  . Smokeless tobacco: Never Used  Substance and Sexual Activity  . Alcohol use: No  . Drug use: No  . Sexual activity: Not on file  Other Topics Concern  . Not on file  Social History Narrative  . Not on file   Social Determinants of Health   Financial Resource  Strain: Not on file  Food Insecurity: Not on file  Transportation Needs: Not on file  Physical Activity: Not on file  Stress: Not on file  Social Connections: Not on file    Family History  Problem Relation Age of Onset  . Breast cancer Mother   . Alzheimer's disease Mother   . Colon cancer Paternal Grandmother   . Diabetes Paternal Grandfather   . Heart attack Paternal Grandfather 83  . Breast cancer Maternal Grandmother   . Heart attack Maternal Grandfather 72  . Heart disease Father        Atrial fibrillation  . Heart failure Father   . Stroke Neg Hx     Review of Systems  Constitutional: Negative for chills and fever.  HENT: Negative for trouble  swallowing.   Respiratory: Negative for cough, shortness of breath and wheezing.   Cardiovascular: Negative for chest pain, palpitations and leg swelling.  Gastrointestinal: Negative for abdominal pain and nausea.       No gerd  Neurological: Positive for headaches (rare). Negative for light-headedness.       Objective:   Vitals:   11/12/20 0804  BP: 130/78  Pulse: 64  Temp: 98 F (36.7 C)  SpO2: 98%   BP Readings from Last 3 Encounters:  11/12/20 130/78  05/11/20 126/84  11/10/19 126/74   Wt Readings from Last 3 Encounters:  11/12/20 221 lb (100.2 kg)  05/11/20 214 lb (97.1 kg)  11/10/19 218 lb (98.9 kg)   Body mass index is 31.71 kg/m.   Physical Exam    Constitutional: Appears well-developed and well-nourished. No distress.  HENT:  Head: Normocephalic and atraumatic.  Neck: Neck supple. No tracheal deviation present. No thyromegaly present.  No cervical lymphadenopathy Cardiovascular: Normal rate, regular rhythm and normal heart sounds.   No murmur heard. No carotid bruit .  No edema Pulmonary/Chest: Effort normal and breath sounds normal. No respiratory distress. No has no wheezes. No rales.  Skin: Skin is warm and dry. Not diaphoretic.  Psychiatric: Normal mood and affect. Behavior is normal.      Assessment & Plan:    See Problem List for Assessment and Plan of chronic medical problems.    This visit occurred during the SARS-CoV-2 public health emergency.  Safety protocols were in place, including screening questions prior to the visit, additional usage of staff PPE, and extensive cleaning of exam room while observing appropriate contact time as indicated for disinfecting solutions.

## 2020-11-12 ENCOUNTER — Ambulatory Visit (INDEPENDENT_AMBULATORY_CARE_PROVIDER_SITE_OTHER): Payer: Medicare Other | Admitting: Internal Medicine

## 2020-11-12 ENCOUNTER — Encounter: Payer: Self-pay | Admitting: Internal Medicine

## 2020-11-12 ENCOUNTER — Other Ambulatory Visit: Payer: Self-pay

## 2020-11-12 VITALS — BP 130/78 | HR 64 | Temp 98.0°F | Ht 70.0 in | Wt 221.0 lb

## 2020-11-12 DIAGNOSIS — Z8719 Personal history of other diseases of the digestive system: Secondary | ICD-10-CM

## 2020-11-12 DIAGNOSIS — I10 Essential (primary) hypertension: Secondary | ICD-10-CM | POA: Diagnosis not present

## 2020-11-12 DIAGNOSIS — R251 Tremor, unspecified: Secondary | ICD-10-CM | POA: Diagnosis not present

## 2020-11-12 DIAGNOSIS — E1169 Type 2 diabetes mellitus with other specified complication: Secondary | ICD-10-CM

## 2020-11-12 DIAGNOSIS — K922 Gastrointestinal hemorrhage, unspecified: Secondary | ICD-10-CM

## 2020-11-12 DIAGNOSIS — E7849 Other hyperlipidemia: Secondary | ICD-10-CM | POA: Diagnosis not present

## 2020-11-12 LAB — COMPREHENSIVE METABOLIC PANEL
ALT: 30 U/L (ref 0–53)
AST: 22 U/L (ref 0–37)
Albumin: 4.2 g/dL (ref 3.5–5.2)
Alkaline Phosphatase: 57 U/L (ref 39–117)
BUN: 11 mg/dL (ref 6–23)
CO2: 29 mEq/L (ref 19–32)
Calcium: 9.8 mg/dL (ref 8.4–10.5)
Chloride: 100 mEq/L (ref 96–112)
Creatinine, Ser: 0.91 mg/dL (ref 0.40–1.50)
GFR: 85.92 mL/min (ref >60.00)
Glucose, Bld: 161 mg/dL — ABNORMAL HIGH (ref 70–99)
Potassium: 4.6 mEq/L (ref 3.5–5.1)
Sodium: 137 mEq/L (ref 135–145)
Total Bilirubin: 0.7 mg/dL (ref 0.2–1.2)
Total Protein: 7 g/dL (ref 6.0–8.3)

## 2020-11-12 LAB — LIPID PANEL
Cholesterol: 107 mg/dL (ref 0–200)
HDL: 41 mg/dL (ref >39.00)
LDL Cholesterol: 39 mg/dL (ref 0–99)
NonHDL: 65.7
Total CHOL/HDL Ratio: 3
Triglycerides: 132 mg/dL (ref 0.0–149.0)
VLDL: 26.4 mg/dL (ref 0.0–40.0)

## 2020-11-12 LAB — HEMOGLOBIN A1C: Hgb A1c MFr Bld: 7 % — ABNORMAL HIGH (ref 4.6–6.5)

## 2020-11-12 MED ORDER — PANTOPRAZOLE SODIUM 40 MG PO TBEC: 40.0000 mg | DELAYED_RELEASE_TABLET | Freq: Every day | ORAL | 1 refills | Status: AC

## 2020-11-12 NOTE — Assessment & Plan Note (Signed)
Chronic Check lipid panel  Continue crestor 5 mg qd Regular exercise and healthy diet encouraged

## 2020-11-12 NOTE — Assessment & Plan Note (Signed)
Chronic BP well controlled Continue hctz 25 mg qd, losartan 100 mg qd cmp

## 2020-11-12 NOTE — Assessment & Plan Note (Addendum)
Chronic Continue protonix 40 mg qd No symptoms of GERD, dysphagia Sees GI

## 2020-11-12 NOTE — Assessment & Plan Note (Signed)
Chronic With hyperlipidemia a1c Continue metformin xr 500 mg daily

## 2020-11-12 NOTE — Addendum Note (Signed)
Addended by: Jacobo Forest on: 11/12/2020 08:42 AM   Modules accepted: Orders

## 2020-11-12 NOTE — Assessment & Plan Note (Signed)
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

## 2020-12-09 ENCOUNTER — Other Ambulatory Visit: Payer: Self-pay | Admitting: Family Medicine

## 2020-12-11 NOTE — Telephone Encounter (Signed)
Sent patient a message that he will need appt for refill of gabapentin.

## 2020-12-26 DIAGNOSIS — K317 Polyp of stomach and duodenum: Secondary | ICD-10-CM | POA: Diagnosis not present

## 2020-12-26 DIAGNOSIS — K449 Diaphragmatic hernia without obstruction or gangrene: Secondary | ICD-10-CM | POA: Diagnosis not present

## 2020-12-26 DIAGNOSIS — K2270 Barrett's esophagus without dysplasia: Secondary | ICD-10-CM | POA: Diagnosis not present

## 2020-12-28 DIAGNOSIS — N5203 Combined arterial insufficiency and corporo-venous occlusive erectile dysfunction: Secondary | ICD-10-CM | POA: Diagnosis not present

## 2020-12-28 DIAGNOSIS — C61 Malignant neoplasm of prostate: Secondary | ICD-10-CM | POA: Diagnosis not present

## 2020-12-28 DIAGNOSIS — R9721 Rising PSA following treatment for malignant neoplasm of prostate: Secondary | ICD-10-CM | POA: Diagnosis not present

## 2021-01-03 DIAGNOSIS — K317 Polyp of stomach and duodenum: Secondary | ICD-10-CM | POA: Diagnosis not present

## 2021-01-03 DIAGNOSIS — K2270 Barrett's esophagus without dysplasia: Secondary | ICD-10-CM | POA: Diagnosis not present

## 2021-01-18 ENCOUNTER — Other Ambulatory Visit: Payer: Self-pay | Admitting: Internal Medicine

## 2021-01-18 DIAGNOSIS — I10 Essential (primary) hypertension: Secondary | ICD-10-CM

## 2021-02-12 ENCOUNTER — Other Ambulatory Visit: Payer: Self-pay

## 2021-02-12 ENCOUNTER — Ambulatory Visit (INDEPENDENT_AMBULATORY_CARE_PROVIDER_SITE_OTHER): Payer: Medicare Other | Admitting: Family Medicine

## 2021-02-12 ENCOUNTER — Ambulatory Visit: Payer: Self-pay

## 2021-02-12 VITALS — BP 156/98 | HR 63 | Ht 70.0 in | Wt 219.8 lb

## 2021-02-12 DIAGNOSIS — M25552 Pain in left hip: Secondary | ICD-10-CM | POA: Diagnosis not present

## 2021-02-12 DIAGNOSIS — M7062 Trochanteric bursitis, left hip: Secondary | ICD-10-CM

## 2021-02-12 NOTE — Patient Instructions (Addendum)
Thank you for coming in today.  I've referred you to Physical Therapy.  Let us know if you don't hear from them in one week.  We will show you some home exercises well. Please complete the exercises that the athletic trainer went over with you: View at [email or web address] using code: [id number]   Please use Voltaren gel (Generic Diclofenac Gel) up to 4x daily for pain as needed.  This is available over-the-counter as both the name brand Voltaren gel and the generic diclofenac gel.  Recheck with me in about 6 weeks or so.

## 2021-02-12 NOTE — Progress Notes (Signed)
   I, Peterson Lombard, LAT, ATC acting as a scribe for Lynne Leader, MD.  Subjective:    CC: L hip pain  HPI: Pt is a 70 y/o male c/o L hip pain x a week or so w/ no known MOI. Pt was previously seen by Dr. Tamala Julian on 07/22/19 for hand pain/trigger finger. Pt locates pain to the lateral aspect of his L hip.  Radiates: no Mechanical symptoms: no LE Numbness/tingling: yes LE Weakness: sometimes when taking the first step after standing Aggravates: transitioning from sitting to standing Treatments tried: exercises, oils, IBU  Pertinent review of Systems: No fevers or chills  Relevant historical information: Hypertension, diabetes   Objective:    Vitals:   02/12/21 0809  BP: (!) 156/98  Pulse: 63  SpO2: 98%   General: Well Developed, well nourished, and in no acute distress.   MSK: Left hip normal-appearing normal motion. Not particularly tender to palpation over greater trochanter. Hip abduction strength diminished 4+/5 with some pain with resisted hip abduction.  External rotation strength is intact. Antalgic gait for the first few steps normalizing after a few more steps.    Impression and Recommendations:    Assessment and Plan: 70 y.o. 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  Patient is not point tender at greater trochanter to a significant extent indicating that he probably does not have a lot of trochanteric bursitis.  Plan for home exercise program taught in clinic today by ATC and physical therapy.  Recheck back in 6 weeks.  If not better consider injection or MRI.Marland Kitchen  PDMP not reviewed this encounter. Orders Placed This Encounter  Procedures  . Ambulatory referral to Physical Therapy    Referral Priority:   Routine    Referral Type:   Physical Medicine    Referral Reason:   Specialty Services Required    Requested Specialty:   Physical Therapy    Number of Visits Requested:   1   No orders of the defined types were placed in this  encounter.   Discussed warning signs or symptoms. Please see discharge instructions. Patient expresses understanding.   The above documentation has been reviewed and is accurate and complete Lynne Leader, M.D.

## 2021-02-14 ENCOUNTER — Ambulatory Visit: Payer: Medicare Other | Admitting: Family Medicine

## 2021-02-18 DIAGNOSIS — R2689 Other abnormalities of gait and mobility: Secondary | ICD-10-CM | POA: Diagnosis not present

## 2021-02-18 DIAGNOSIS — M25552 Pain in left hip: Secondary | ICD-10-CM | POA: Diagnosis not present

## 2021-02-18 DIAGNOSIS — M25652 Stiffness of left hip, not elsewhere classified: Secondary | ICD-10-CM | POA: Diagnosis not present

## 2021-02-19 DIAGNOSIS — M25552 Pain in left hip: Secondary | ICD-10-CM | POA: Diagnosis not present

## 2021-02-19 DIAGNOSIS — M25652 Stiffness of left hip, not elsewhere classified: Secondary | ICD-10-CM | POA: Diagnosis not present

## 2021-02-19 DIAGNOSIS — R2689 Other abnormalities of gait and mobility: Secondary | ICD-10-CM | POA: Diagnosis not present

## 2021-02-25 DIAGNOSIS — R2689 Other abnormalities of gait and mobility: Secondary | ICD-10-CM | POA: Diagnosis not present

## 2021-02-25 DIAGNOSIS — M25652 Stiffness of left hip, not elsewhere classified: Secondary | ICD-10-CM | POA: Diagnosis not present

## 2021-02-25 DIAGNOSIS — M25552 Pain in left hip: Secondary | ICD-10-CM | POA: Diagnosis not present

## 2021-03-04 DIAGNOSIS — M25552 Pain in left hip: Secondary | ICD-10-CM | POA: Diagnosis not present

## 2021-03-04 DIAGNOSIS — R2689 Other abnormalities of gait and mobility: Secondary | ICD-10-CM | POA: Diagnosis not present

## 2021-03-04 DIAGNOSIS — M25652 Stiffness of left hip, not elsewhere classified: Secondary | ICD-10-CM | POA: Diagnosis not present

## 2021-03-11 DIAGNOSIS — M25552 Pain in left hip: Secondary | ICD-10-CM | POA: Diagnosis not present

## 2021-03-11 DIAGNOSIS — R2689 Other abnormalities of gait and mobility: Secondary | ICD-10-CM | POA: Diagnosis not present

## 2021-03-11 DIAGNOSIS — M25652 Stiffness of left hip, not elsewhere classified: Secondary | ICD-10-CM | POA: Diagnosis not present

## 2021-03-15 ENCOUNTER — Other Ambulatory Visit: Payer: Self-pay | Admitting: Internal Medicine

## 2021-03-20 DIAGNOSIS — M25552 Pain in left hip: Secondary | ICD-10-CM | POA: Diagnosis not present

## 2021-03-20 DIAGNOSIS — R2689 Other abnormalities of gait and mobility: Secondary | ICD-10-CM | POA: Diagnosis not present

## 2021-03-20 DIAGNOSIS — M25652 Stiffness of left hip, not elsewhere classified: Secondary | ICD-10-CM | POA: Diagnosis not present

## 2021-03-20 NOTE — Progress Notes (Signed)
   I, Wendy Poet, LAT, ATC, am serving as scribe for Dr. Lynne Leader.  Johnny [name] is a 70 y.o. male who presents to [place] [facility] today for f/u L hip pain. Pt was last seen by Dr. Georgina Snell on 02/12/21 and was taught a HEP and referred to [facility]. Today, pt reports that is feeling much better, noting almost 100% improvement.  He has completed around 6-7 sessions and was d/c from PT yesterday.   Pertinent review of systems: No fevers or chills  Relevant historical information: Diabetes   Exam:  BP 138/72 (BP Location: Right Arm, Patient Position: Sitting, Cuff Size: Normal)   Pulse 68   Ht 5\' 10"  (1.778 m)   Wt 216 lb 6.4 oz (98.2 kg)   SpO2 97%   BMI 31.05 kg/m  General: Well Developed, well nourished, and in no acute distress.   MSK: Left hip normal motion normal strength normal gait.     Assessment and Plan: 70 y.o. 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  Significantly improved with physical therapy.  Patient has transition to home exercise program on his own now.  Plan to continue home exercise program watchful waiting and recheck with me as needed.  Discussed precautions check back reasons and that I could certainly order physical therapy again in the future if needed if his symptoms should return.  Total encounter time 20 minutes including face-to-face time with the patient and, reviewing past medical record, and charting on the date of service.     Discussed warning signs or symptoms. Please see discharge instructions. Patient expresses understanding.   The above documentation has been reviewed and is accurate and complete Lynne Leader, M.D.

## 2021-03-21 ENCOUNTER — Other Ambulatory Visit: Payer: Self-pay

## 2021-03-21 ENCOUNTER — Encounter: Payer: Self-pay | Admitting: Family Medicine

## 2021-03-21 ENCOUNTER — Ambulatory Visit (INDEPENDENT_AMBULATORY_CARE_PROVIDER_SITE_OTHER): Payer: Medicare Other | Admitting: Family Medicine

## 2021-03-21 VITALS — BP 138/72 | HR 68 | Ht 70.0 in | Wt 216.4 lb

## 2021-03-21 DIAGNOSIS — M7062 Trochanteric bursitis, left hip: Secondary | ICD-10-CM | POA: Diagnosis not present

## 2021-03-21 NOTE — Patient Instructions (Signed)
Thank you for coming in today.   Keep up the home exercises.   Recheck with me as needed.   We can order physical therapy again if needed.

## 2021-05-12 NOTE — Patient Instructions (Addendum)
   Monitor your weight at home - if your weight loss continues please come back in.     Blood work was ordered.     Medications changes include :   none     Please followup in 6 months

## 2021-05-12 NOTE — Progress Notes (Signed)
Subjective:    Patient ID: Johnny [name], male    DOB: Dec 01, 1950, 70 y.o.   MRN: [id number]  HPI The patient is here for follow up of their chronic medical problems, including htn, dm, hichol. H/o GIB  Goes to gym 2-3/wk   Weight 213 -> 202 in a week or week and a half - he was playing a lot of golf during this time.  He was sweating a lot.  His appetite was a little less.  He does monitor his weight at home.  He denies any other circumstances that could have influenced his weight.    Medications and allergies reviewed with patient and updated if appropriate.  Patient Active Problem List   Diagnosis Date Noted   Tremor 11/12/2020   Trigger finger 07/22/2019   MCP subluxation, initial encounter 05/26/2018   Biceps tendinitis on right 05/20/2017   Cervical radiculopathy 03/25/2017   Arthritis of carpometacarpal (CMC) joint of right thumb 12/15/2016   Subluxation/dislocation ECU (extensor carpi ulnaris) tendon, left, initial encounter 09/25/2016   Diabetes ([place]) 06/22/2016   H/O: GI bleed 10/28/2015   Rosacea 04/16/2015   Hyperlipidemia 01/09/2009   PROSTATE CANCER, HX OF - [facility] 01/09/2009   Essential hypertension 08/02/2007   ABNORMAL ELECTROCARDIOGRAM 08/02/2007    Current Outpatient Medications on File Prior to Visit  Medication Sig Dispense Refill   b complex vitamins tablet Take 1 tablet by mouth daily.     Cholecalciferol (VITAMIN D3) 50 MCG (2000 UT) capsule Take by mouth.     hydrochlorothiazide (HYDRODIURIL) 25 MG tablet TAKE 1 TABLET BY MOUTH EVERY DAY 90 tablet 1   losartan (COZAAR) 100 MG tablet TAKE 1 TABLET (100 MG TOTAL) BY MOUTH DAILY. 90 tablet 1   metFORMIN (GLUCOPHAGE-XR) 500 MG 24 hr tablet TAKE 1 TABLET BY MOUTH EVERY DAY WITH BREAKFAST 90 tablet 3   metroNIDAZOLE (METROCREAM) 0.75 % cream      Omega-3 Fatty Acids (OMEGA 3 PO) Take 1 tablet by mouth daily.      pantoprazole (PROTONIX) 40 MG tablet Take 1 tablet (40 mg total) by mouth daily.  18090 tablet 1   rosuvastatin (CRESTOR) 5 MG tablet TAKE 1 TABLET BY MOUTH EVERY DAY 90 tablet 1   Sulfacetamide Sodium-Sulfur 9.8-4.8 % LIQD      No current facility-administered medications on file prior to visit.    Past Medical History:  Diagnosis Date   Hyperlipidemia    LDL goal = < 110   Hypertension    Prostate cancer [facility]) 2008   Dr Darcus Austin, [facility]    Past Surgical History:  Procedure Laterality Date   CERVICAL FUSION  2007   C5-C7 , Dr Saintclair Halsted    COLONOSCOPY  2006   negative; [place], [place]   ESOPHAGOGASTRODUODENOSCOPY Left 10/28/2015   Procedure: ESOPHAGOGASTRODUODENOSCOPY (EGD);  Surgeon: Teena Irani, MD;  Location: [facility];  Service: Endoscopy;  Laterality: Left;   PROSTATECTOMY  11/2007   Dr.Moul, [facility]   WRIST SURGERY  1998   [place] bone resection 2006    Social History   Socioeconomic History   Marital status: Married    Spouse name: Not on file   Number of children: 3   Years of education: Not on file   Highest education level: Not on file  Occupational History   Occupation: Teacher/Coach  Tobacco Use   Smoking status: Never   Smokeless tobacco: Never  Substance and Sexual Activity   Alcohol use: No   Drug use: No  Sexual activity: Not on file  Other Topics Concern   Not on file  Social History Narrative   Not on file   Social Determinants of Health   Financial Resource Strain: Not on file  Food Insecurity: Not on file  Transportation Needs: Not on file  Physical Activity: Not on file  Stress: Not on file  Social Connections: Not on file    Family History  Problem Relation Age of Onset   Breast cancer Mother    Alzheimer's disease Mother    Colon cancer Paternal Grandmother    Diabetes Paternal Grandfather    Heart attack Paternal Grandfather 65   Breast cancer Maternal Grandmother    Heart attack Maternal Grandfather 36   Heart disease Father        Atrial fibrillation   Heart failure Father    Stroke Neg Hx     Review of  Systems  Constitutional:  Positive for unexpected weight change. Negative for chills and fever.  Respiratory:  Negative for cough, shortness of breath and wheezing.   Cardiovascular:  Positive for palpitations (occ). Negative for chest pain and leg swelling.  Gastrointestinal:  Negative for abdominal pain, blood in stool (no black stool), constipation, diarrhea and nausea.       Occ reflux  Neurological:  Negative for light-headedness and headaches.      Objective:   Vitals:   05/13/21 0811  BP: 132/74  Pulse: 70  Temp: 98 F (36.7 C)  SpO2: 98%   BP Readings from Last 3 Encounters:  05/13/21 132/74  03/21/21 138/72  02/12/21 (!) 156/98   Wt Readings from Last 3 Encounters:  05/13/21 201 lb (91.2 kg)  03/21/21 216 lb 6.4 oz (98.2 kg)  02/12/21 219 lb 12.8 oz (99.7 kg)   Body mass index is 28.84 kg/m.   Physical Exam    Constitutional: Appears well-developed and well-nourished. No distress.  HENT:  Head: Normocephalic and atraumatic.  Neck: Neck supple. No tracheal deviation present. No thyromegaly present.  No cervical lymphadenopathy Cardiovascular: Normal rate, regular rhythm and normal heart sounds.   No murmur heard. No carotid bruit .  No edema Pulmonary/Chest: Effort normal and breath sounds normal. No respiratory distress. No has no wheezes. No rales. Abdomen: Soft, nontender, nondistended, no hepatosplenomegaly Skin: Skin is warm and dry. Not diaphoretic.  Psychiatric: Normal mood and affect. Behavior is normal.      Assessment & Plan:    See Problem List for Assessment and Plan of chronic medical problems.    This visit occurred during the SARS-CoV-2 public health emergency.  Safety protocols were in place, including screening questions prior to the visit, additional usage of staff PPE, and extensive cleaning of exam room while observing appropriate contact time as indicated for disinfecting solutions.

## 2021-05-13 ENCOUNTER — Ambulatory Visit (INDEPENDENT_AMBULATORY_CARE_PROVIDER_SITE_OTHER): Payer: Medicare Other | Admitting: Internal Medicine

## 2021-05-13 ENCOUNTER — Encounter: Payer: Self-pay | Admitting: Internal Medicine

## 2021-05-13 ENCOUNTER — Other Ambulatory Visit: Payer: Self-pay

## 2021-05-13 VITALS — BP 132/74 | HR 70 | Temp 98.0°F | Ht 70.0 in | Wt 201.0 lb

## 2021-05-13 DIAGNOSIS — Z8719 Personal history of other diseases of the digestive system: Secondary | ICD-10-CM

## 2021-05-13 DIAGNOSIS — Z8546 Personal history of malignant neoplasm of prostate: Secondary | ICD-10-CM | POA: Diagnosis not present

## 2021-05-13 DIAGNOSIS — Z125 Encounter for screening for malignant neoplasm of prostate: Secondary | ICD-10-CM

## 2021-05-13 DIAGNOSIS — I10 Essential (primary) hypertension: Secondary | ICD-10-CM | POA: Diagnosis not present

## 2021-05-13 DIAGNOSIS — E7849 Other hyperlipidemia: Secondary | ICD-10-CM | POA: Diagnosis not present

## 2021-05-13 DIAGNOSIS — R634 Abnormal weight loss: Secondary | ICD-10-CM | POA: Insufficient documentation

## 2021-05-13 DIAGNOSIS — E1169 Type 2 diabetes mellitus with other specified complication: Secondary | ICD-10-CM | POA: Diagnosis not present

## 2021-05-13 LAB — CBC WITH DIFFERENTIAL/PLATELET
Basophils Absolute: 0 10*3/uL (ref 0.0–0.1)
Basophils Relative: 0.2 % (ref 0.0–3.0)
Eosinophils Absolute: 0.1 10*3/uL (ref 0.0–0.7)
Eosinophils Relative: 1.2 % (ref 0.0–5.0)
HCT: 44.4 % (ref 39.0–52.0)
Hemoglobin: 14.3 g/dL (ref 13.0–17.0)
Lymphocytes Relative: 17 % (ref 12.0–46.0)
Lymphs Abs: 1.2 10*3/uL (ref 0.7–4.0)
MCHC: 32.3 g/dL (ref 30.0–36.0)
MCV: 78.8 fl (ref 78.0–100.0)
Monocytes Absolute: 0.6 10*3/uL (ref 0.1–1.0)
Monocytes Relative: 9.4 % (ref 3.0–12.0)
Neutro Abs: 4.9 10*3/uL (ref 1.4–7.7)
Neutrophils Relative %: 72.2 % (ref 43.0–77.0)
Platelets: 254 10*3/uL (ref 150.0–400.0)
RBC: 5.63 Mil/uL (ref 4.22–5.81)
RDW: 16.1 % — ABNORMAL HIGH (ref 11.5–15.5)
WBC: 6.8 10*3/uL (ref 4.0–10.5)

## 2021-05-13 LAB — HEMOGLOBIN A1C: Hgb A1c MFr Bld: 13.2 % — ABNORMAL HIGH (ref 4.6–6.5)

## 2021-05-13 LAB — COMPREHENSIVE METABOLIC PANEL
ALT: 37 U/L (ref 0–53)
AST: 24 U/L (ref 0–37)
Albumin: 4.2 g/dL (ref 3.5–5.2)
Alkaline Phosphatase: 66 U/L (ref 39–117)
BUN: 18 mg/dL (ref 6–23)
CO2: 25 mEq/L (ref 19–32)
Calcium: 10 mg/dL (ref 8.4–10.5)
Chloride: 97 mEq/L (ref 96–112)
Creatinine, Ser: 1.04 mg/dL (ref 0.40–1.50)
GFR: 72.95 mL/min (ref >60.00)
Glucose, Bld: 332 mg/dL — ABNORMAL HIGH (ref 70–99)
Potassium: 4 mEq/L (ref 3.5–5.1)
Sodium: 134 mEq/L — ABNORMAL LOW (ref 135–145)
Total Bilirubin: 0.8 mg/dL (ref 0.2–1.2)
Total Protein: 7.3 g/dL (ref 6.0–8.3)

## 2021-05-13 LAB — LIPID PANEL
Cholesterol: 117 mg/dL (ref 0–200)
HDL: 39.5 mg/dL (ref >39.00)
LDL Cholesterol: 48 mg/dL (ref 0–99)
NonHDL: 77.67
Total CHOL/HDL Ratio: 3
Triglycerides: 148 mg/dL (ref 0.0–149.0)
VLDL: 29.6 mg/dL (ref 0.0–40.0)

## 2021-05-13 LAB — TSH: TSH: 1.44 u[IU]/mL (ref 0.35–5.50)

## 2021-05-13 LAB — PSA, MEDICARE: PSA: 0.8 ng/ml (ref 0.10–4.00)

## 2021-05-13 NOTE — Assessment & Plan Note (Signed)
Chronic Check lipid panel  Continue Crestor 5 mg daily Regular exercise and healthy diet encouraged  

## 2021-05-13 NOTE — Assessment & Plan Note (Signed)
History of prostate cancer Following with [facility] Last PSA was in April Will check PSA today given his weight loss

## 2021-05-13 NOTE — Assessment & Plan Note (Signed)
Chronic BP well controlled Continue hydrochlorothiazide 25 mg daily, losartan 100 mg daily cmp

## 2021-05-13 NOTE — Assessment & Plan Note (Addendum)
Acute Has lost 12 ish pounds in a short time period - 1- 1 1/2 weeks w/o trying He thinks it is likely related to golfing more and sweating a lot-loss of water weight.  He states his appetite may have been slightly decreased during this time as well No new concerning symptoms Check cbc, tsh, cmp, PSA He will continue to monitor his weight at home and if he continues to lose weight he will return for further evaluation

## 2021-05-13 NOTE — Assessment & Plan Note (Signed)
Chronic Lab Results  Component Value Date   HGBA1C 7.0 (H) 11/12/2020   Controlled Check A1c today Continue metformin XR 500 mg daily with breakfast

## 2021-05-13 NOTE — Assessment & Plan Note (Signed)
History of GI bleed Has occasional reflux No black stool, blood in the stool Continue pantoprazole 40 mg daily

## 2021-05-14 ENCOUNTER — Telehealth: Payer: Self-pay | Admitting: Internal Medicine

## 2021-05-14 NOTE — Telephone Encounter (Signed)
Patient calling back after receiving vm from nurse  Patient says he has been taking his medication as prescribed  Call patient back if any additional details or changes are needed [phone]

## 2021-05-20 NOTE — Progress Notes (Signed)
Subjective:    Patient ID: Johnny [name], male    DOB: December 05, 1950, 70 y.o.   MRN: [id number]  HPI The patient is here for follow up of his DM.  Lab Results  Component Value Date   HGBA1C 13.2 (H) 05/13/2021   He has been taking his medication daily.  A few weeks prior to blood work he had blurry vision and he had increased urination.  He did not mention this at his last visit and wanted me to know.  He was drinking a lot of tea, liquid IV and cheerwine.  He was eating [place] ice.  One week ago he stopped drinking tea/soda.  He is no longer having blurriness and feels better than he has.    He thinks these are some of the reasons why the sugar was elevated.  He thinks typically he eats pretty well, but except for the things he mentioned above.    Medications and allergies reviewed with patient and updated if appropriate.  Patient Active Problem List   Diagnosis Date Noted   Weight loss 05/13/2021   Tremor 11/12/2020   Trigger finger 07/22/2019   MCP subluxation, initial encounter 05/26/2018   Biceps tendinitis on right 05/20/2017   Cervical radiculopathy 03/25/2017   Arthritis of carpometacarpal (CMC) joint of right thumb 12/15/2016   Subluxation/dislocation ECU (extensor carpi ulnaris) tendon, left, initial encounter 09/25/2016   Diabetes ([place]) 06/22/2016   H/O: GI bleed 10/28/2015   Rosacea 04/16/2015   Hyperlipidemia 01/09/2009   PROSTATE CANCER, HX OF - [facility] 01/09/2009   Essential hypertension 08/02/2007   ABNORMAL ELECTROCARDIOGRAM 08/02/2007    Current Outpatient Medications on File Prior to Visit  Medication Sig Dispense Refill   b complex vitamins tablet Take 1 tablet by mouth daily.     Cholecalciferol (VITAMIN D3) 50 MCG (2000 UT) capsule Take by mouth.     hydrochlorothiazide (HYDRODIURIL) 25 MG tablet TAKE 1 TABLET BY MOUTH EVERY DAY 90 tablet 1   losartan (COZAAR) 100 MG tablet TAKE 1 TABLET (100 MG TOTAL) BY MOUTH DAILY. 90 tablet 1    metroNIDAZOLE (METROCREAM) 0.75 % cream      Omega-3 Fatty Acids (OMEGA 3 PO) Take 1 tablet by mouth daily.      pantoprazole (PROTONIX) 40 MG tablet Take 1 tablet (40 mg total) by mouth daily. 18090 tablet 1   rosuvastatin (CRESTOR) 5 MG tablet TAKE 1 TABLET BY MOUTH EVERY DAY 90 tablet 1   Sulfacetamide Sodium-Sulfur 9.8-4.8 % LIQD      No current facility-administered medications on file prior to visit.    Past Medical History:  Diagnosis Date   Hyperlipidemia    LDL goal = < 110   Hypertension    Prostate cancer [facility]) 2008   Dr Darcus Austin, [facility]    Past Surgical History:  Procedure Laterality Date   CERVICAL FUSION  2007   C5-C7 , Dr Saintclair Halsted    COLONOSCOPY  2006   negative; [place], [place]   ESOPHAGOGASTRODUODENOSCOPY Left 10/28/2015   Procedure: ESOPHAGOGASTRODUODENOSCOPY (EGD);  Surgeon: Teena Irani, MD;  Location: [facility];  Service: Endoscopy;  Laterality: Left;   PROSTATECTOMY  11/2007   Dr.Moul, [facility]   WRIST SURGERY  1998   [place] bone resection 2006    Social History   Socioeconomic History   Marital status: Married    Spouse name: Not on file   Number of children: 3   Years of education: Not on file   Highest education level: Not  on file  Occupational History   Occupation: Teacher/Coach  Tobacco Use   Smoking status: Never   Smokeless tobacco: Never  Substance and Sexual Activity   Alcohol use: No   Drug use: No   Sexual activity: Not on file  Other Topics Concern   Not on file  Social History Narrative   Not on file   Social Determinants of Health   Financial Resource Strain: Not on file  Food Insecurity: Not on file  Transportation Needs: Not on file  Physical Activity: Not on file  Stress: Not on file  Social Connections: Not on file    Family History  Problem Relation Age of Onset   Breast cancer Mother    Alzheimer's disease Mother    Colon cancer Paternal Grandmother    Diabetes Paternal Grandfather    Heart attack Paternal Grandfather 4    Breast cancer Maternal Grandmother    Heart attack Maternal Grandfather 41   Heart disease Father        Atrial fibrillation   Heart failure Father    Stroke Neg Hx     Review of Systems  Constitutional:  Negative for fever.  Eyes:  Negative for visual disturbance.  Endocrine: Positive for polyuria.  Neurological:  Negative for light-headedness and headaches.      Objective:   Vitals:   05/21/21 0920  BP: 130/76  Pulse: 68  Temp: 98.1 F (36.7 C)  SpO2: 99%   BP Readings from Last 3 Encounters:  05/21/21 130/76  05/13/21 132/74  03/21/21 138/72   Wt Readings from Last 3 Encounters:  05/21/21 201 lb (91.2 kg)  05/13/21 201 lb (91.2 kg)  03/21/21 216 lb 6.4 oz (98.2 kg)   Body mass index is 28.84 kg/m.   Physical Exam    Constitutional: Appears well-developed and well-nourished. No distress.       Assessment & Plan:    See Problem List for Assessment and Plan of chronic medical problems.    This visit occurred during the SARS-CoV-2 public health emergency.  Safety protocols were in place, including screening questions prior to the visit, additional usage of staff PPE, and extensive cleaning of exam room while observing appropriate contact time as indicated for disinfecting solutions.

## 2021-05-21 ENCOUNTER — Other Ambulatory Visit: Payer: Self-pay

## 2021-05-21 ENCOUNTER — Ambulatory Visit (INDEPENDENT_AMBULATORY_CARE_PROVIDER_SITE_OTHER): Payer: Medicare Other | Admitting: Internal Medicine

## 2021-05-21 ENCOUNTER — Encounter: Payer: Self-pay | Admitting: Internal Medicine

## 2021-05-21 DIAGNOSIS — E1169 Type 2 diabetes mellitus with other specified complication: Secondary | ICD-10-CM

## 2021-05-21 MED ORDER — METFORMIN HCL ER 500 MG PO TB24: 1500.0000 mg | ORAL_TABLET | Freq: Every day | ORAL | 3 refills | Status: DC

## 2021-05-21 NOTE — Patient Instructions (Addendum)
    Medications changes include :   increase metformin to 3 tabs with breakfast   Your prescription(s) have been submitted to your pharmacy. Please take as directed and contact our office if you believe you are having problem(s) with the medication(s).   Please followup in 3 months

## 2021-05-21 NOTE — Assessment & Plan Note (Signed)
Chronic With hyperlipidemia, hypertension Last blood work was surprisingly uncontrolled, but he has recognized some of the things that have contributed to that and has already made changes in his diet Stressed low sugar/carbohydrate diet Increase metformin to 1500 mg XR daily with breakfast Follow-up in 3 months so we can reevaluate A1c and hopefully decrease metformin at that time if able

## 2021-06-06 DIAGNOSIS — L719 Rosacea, unspecified: Secondary | ICD-10-CM | POA: Diagnosis not present

## 2021-06-06 DIAGNOSIS — L821 Other seborrheic keratosis: Secondary | ICD-10-CM | POA: Diagnosis not present

## 2021-06-06 DIAGNOSIS — L578 Other skin changes due to chronic exposure to nonionizing radiation: Secondary | ICD-10-CM | POA: Diagnosis not present

## 2021-06-06 DIAGNOSIS — L570 Actinic keratosis: Secondary | ICD-10-CM | POA: Diagnosis not present

## 2021-07-05 DIAGNOSIS — R9721 Rising PSA following treatment for malignant neoplasm of prostate: Secondary | ICD-10-CM | POA: Diagnosis not present

## 2021-07-05 DIAGNOSIS — N5203 Combined arterial insufficiency and corporo-venous occlusive erectile dysfunction: Secondary | ICD-10-CM | POA: Diagnosis not present

## 2021-07-05 DIAGNOSIS — C61 Malignant neoplasm of prostate: Secondary | ICD-10-CM | POA: Diagnosis not present

## 2021-07-10 ENCOUNTER — Telehealth: Payer: Self-pay | Admitting: Internal Medicine

## 2021-07-10 NOTE — Telephone Encounter (Signed)
Left message for patient to call me back at [phone] to schedule Medicare Annual Wellness Visit   Last AWV  12/31/18  Please schedule at anytime with [facility] if patient calls the office back.    40 Minutes appointment   Any questions, please call me at [phone]

## 2021-07-18 ENCOUNTER — Telehealth: Payer: Self-pay | Admitting: Family Medicine

## 2021-07-18 NOTE — Telephone Encounter (Signed)
Called pt to confirm which hip he'd like to go to PT for.  Assuming it is his L hip as that is what we saw him for previously but called to confirm.  Happy to place a PT referral once R/L hip is confirmed.

## 2021-07-18 NOTE — Telephone Encounter (Signed)
Patient called asking if Dr Georgina Snell would put in a referral for the patient to continue PT. He said that hip his has been bothering him and he would like to try more PT. (Previously sent to [facility])  Please advise.

## 2021-07-19 ENCOUNTER — Other Ambulatory Visit: Payer: Self-pay | Admitting: Physical Therapy

## 2021-07-19 DIAGNOSIS — M25552 Pain in left hip: Secondary | ICD-10-CM

## 2021-07-19 DIAGNOSIS — M7062 Trochanteric bursitis, left hip: Secondary | ICD-10-CM

## 2021-07-19 NOTE — Telephone Encounter (Signed)
Referral sent 

## 2021-07-19 NOTE — Telephone Encounter (Signed)
PT order placed to [facility] per pt request.

## 2021-07-19 NOTE — Telephone Encounter (Signed)
Patient called back, still left hip.

## 2021-07-22 DIAGNOSIS — M7062 Trochanteric bursitis, left hip: Secondary | ICD-10-CM | POA: Diagnosis not present

## 2021-07-22 DIAGNOSIS — M6281 Muscle weakness (generalized): Secondary | ICD-10-CM | POA: Diagnosis not present

## 2021-07-22 DIAGNOSIS — M25552 Pain in left hip: Secondary | ICD-10-CM | POA: Diagnosis not present

## 2021-07-23 DIAGNOSIS — K2270 Barrett's esophagus without dysplasia: Secondary | ICD-10-CM | POA: Diagnosis not present

## 2021-07-23 DIAGNOSIS — K317 Polyp of stomach and duodenum: Secondary | ICD-10-CM | POA: Diagnosis not present

## 2021-07-23 DIAGNOSIS — K219 Gastro-esophageal reflux disease without esophagitis: Secondary | ICD-10-CM | POA: Diagnosis not present

## 2021-07-24 DIAGNOSIS — M25552 Pain in left hip: Secondary | ICD-10-CM | POA: Diagnosis not present

## 2021-07-24 DIAGNOSIS — M6281 Muscle weakness (generalized): Secondary | ICD-10-CM | POA: Diagnosis not present

## 2021-07-24 DIAGNOSIS — M7062 Trochanteric bursitis, left hip: Secondary | ICD-10-CM | POA: Diagnosis not present

## 2021-07-29 DIAGNOSIS — M6281 Muscle weakness (generalized): Secondary | ICD-10-CM | POA: Diagnosis not present

## 2021-07-29 DIAGNOSIS — M7062 Trochanteric bursitis, left hip: Secondary | ICD-10-CM | POA: Diagnosis not present

## 2021-07-29 DIAGNOSIS — M25552 Pain in left hip: Secondary | ICD-10-CM | POA: Diagnosis not present

## 2021-07-31 DIAGNOSIS — M6281 Muscle weakness (generalized): Secondary | ICD-10-CM | POA: Diagnosis not present

## 2021-07-31 DIAGNOSIS — M25552 Pain in left hip: Secondary | ICD-10-CM | POA: Diagnosis not present

## 2021-07-31 DIAGNOSIS — M7062 Trochanteric bursitis, left hip: Secondary | ICD-10-CM | POA: Diagnosis not present

## 2021-08-05 DIAGNOSIS — M7062 Trochanteric bursitis, left hip: Secondary | ICD-10-CM | POA: Diagnosis not present

## 2021-08-05 DIAGNOSIS — M25552 Pain in left hip: Secondary | ICD-10-CM | POA: Diagnosis not present

## 2021-08-05 DIAGNOSIS — M6281 Muscle weakness (generalized): Secondary | ICD-10-CM | POA: Diagnosis not present

## 2021-08-12 DIAGNOSIS — M7062 Trochanteric bursitis, left hip: Secondary | ICD-10-CM | POA: Diagnosis not present

## 2021-08-12 DIAGNOSIS — M25552 Pain in left hip: Secondary | ICD-10-CM | POA: Diagnosis not present

## 2021-08-12 DIAGNOSIS — M6281 Muscle weakness (generalized): Secondary | ICD-10-CM | POA: Diagnosis not present

## 2021-08-19 ENCOUNTER — Encounter: Payer: Self-pay | Admitting: Internal Medicine

## 2021-08-19 DIAGNOSIS — M6281 Muscle weakness (generalized): Secondary | ICD-10-CM | POA: Diagnosis not present

## 2021-08-19 DIAGNOSIS — M7062 Trochanteric bursitis, left hip: Secondary | ICD-10-CM | POA: Diagnosis not present

## 2021-08-19 DIAGNOSIS — M25552 Pain in left hip: Secondary | ICD-10-CM | POA: Diagnosis not present

## 2021-08-19 NOTE — Patient Instructions (Addendum)
    Blood work was ordered.      Medications changes include :   none     Please followup in 6 months  

## 2021-08-19 NOTE — Progress Notes (Signed)
Subjective:    Patient ID: Johnny [name], male    DOB: December 23, 1950, 70 y.o.   MRN: [id number]  This visit occurred during the SARS-CoV-2 public health emergency.  Safety protocols were in place, including screening questions prior to the visit, additional usage of staff PPE, and extensive cleaning of exam room while observing appropriate contact time as indicated for disinfecting solutions.     HPI The patient is here for follow up of their chronic medical problems, including DM, htn, hld, h/o GIB   He is still exercising regularly.   He is doing much better with compliance with a diabetic diet.    He is taking all of his medications as prescribed.     Medications and allergies reviewed with patient and updated if appropriate.  Patient Active Problem List   Diagnosis Date Noted   Weight loss 05/13/2021   Tremor 11/12/2020   Trigger finger 07/22/2019   MCP subluxation, initial encounter 05/26/2018   Biceps tendinitis on right 05/20/2017   Cervical radiculopathy 03/25/2017   Arthritis of carpometacarpal (CMC) joint of right thumb 12/15/2016   Subluxation/dislocation ECU (extensor carpi ulnaris) tendon, left, initial encounter 09/25/2016   Diabetes ([place]) 06/22/2016   H/O: GI bleed 10/28/2015   Rosacea 04/16/2015   Hyperlipidemia 01/09/2009   PROSTATE CANCER, HX OF - [facility] 01/09/2009   Essential hypertension 08/02/2007   ABNORMAL ELECTROCARDIOGRAM 08/02/2007    Current Outpatient Medications on File Prior to Visit  Medication Sig Dispense Refill   azithromycin (ZITHROMAX) 250 MG tablet TAKE 2 TABLETS BY MOUTH TODAY, THEN TAKE 1 TABLET DAILY FOR 4 DAYS     b complex vitamins tablet Take 1 tablet by mouth daily.     Cholecalciferol (VITAMIN D3) 50 MCG (2000 UT) capsule Take by mouth.     hydrochlorothiazide (HYDRODIURIL) 25 MG tablet TAKE 1 TABLET BY MOUTH EVERY DAY 90 tablet 1   losartan (COZAAR) 100 MG tablet TAKE 1 TABLET (100 MG TOTAL) BY MOUTH DAILY. 90  tablet 1   metFORMIN (GLUCOPHAGE-XR) 500 MG 24 hr tablet Take 3 tablets (1,500 mg total) by mouth daily with breakfast. 270 tablet 3   metroNIDAZOLE (METROCREAM) 0.75 % cream      Omega-3 Fatty Acids (OMEGA 3 PO) Take 1 tablet by mouth daily.      pantoprazole (PROTONIX) 40 MG tablet Take 1 tablet (40 mg total) by mouth daily. 18090 tablet 1   rosuvastatin (CRESTOR) 5 MG tablet TAKE 1 TABLET BY MOUTH EVERY DAY 90 tablet 1   Sulfacetamide Sodium-Sulfur 9.8-4.8 % LIQD      No current facility-administered medications on file prior to visit.    Past Medical History:  Diagnosis Date   Hyperlipidemia    LDL goal = < 110   Hypertension    Prostate cancer [facility]) 2008   Dr Darcus Austin, [facility]    Past Surgical History:  Procedure Laterality Date   CERVICAL FUSION  2007   C5-C7 , Dr Saintclair Halsted    COLONOSCOPY  2006   negative; [place], [place]   ESOPHAGOGASTRODUODENOSCOPY Left 10/28/2015   Procedure: ESOPHAGOGASTRODUODENOSCOPY (EGD);  Surgeon: Teena Irani, MD;  Location: [facility];  Service: Endoscopy;  Laterality: Left;   PROSTATECTOMY  11/2007   Dr.Moul, [facility]   WRIST SURGERY  1998   [place] bone resection 2006    Social History   Socioeconomic History   Marital status: Married    Spouse name: Not on file   Number of children: 3   Years  of education: Not on file   Highest education level: Not on file  Occupational History   Occupation: Teacher/Coach  Tobacco Use   Smoking status: Never   Smokeless tobacco: Never  Substance and Sexual Activity   Alcohol use: No   Drug use: No   Sexual activity: Not on file  Other Topics Concern   Not on file  Social History Narrative   Not on file   Social Determinants of Health   Financial Resource Strain: Not on file  Food Insecurity: Not on file  Transportation Needs: Not on file  Physical Activity: Not on file  Stress: Not on file  Social Connections: Not on file    Family History  Problem Relation Age of Onset   Breast cancer Mother     Alzheimer's disease Mother    Colon cancer Paternal Grandmother    Diabetes Paternal Grandfather    Heart attack Paternal Grandfather 51   Breast cancer Maternal Grandmother    Heart attack Maternal Grandfather 44   Heart disease Father        Atrial fibrillation   Heart failure Father    Stroke Neg Hx     Review of Systems  Constitutional:  Negative for chills and fever.  Respiratory:  Negative for cough, shortness of breath and wheezing.   Cardiovascular:  Negative for chest pain and leg swelling.  Gastrointestinal:  Positive for blood in stool (no melena). Negative for abdominal pain and nausea.       Occ gerd  Neurological:  Negative for light-headedness, numbness and headaches.      Objective:   Vitals:   08/20/21 0826  BP: 126/80  Pulse: 68  Temp: 98.1 F (36.7 C)  SpO2: 98%   BP Readings from Last 3 Encounters:  08/20/21 126/80  05/21/21 130/76  05/13/21 132/74   Wt Readings from Last 3 Encounters:  08/20/21 202 lb (91.6 kg)  05/21/21 201 lb (91.2 kg)  05/13/21 201 lb (91.2 kg)   Body mass index is 28.98 kg/m.   Physical Exam    Constitutional: Appears well-developed and well-nourished. No distress.  HENT:  Head: Normocephalic and atraumatic.  Neck: Neck supple. No tracheal deviation present. No thyromegaly present.  No cervical lymphadenopathy Cardiovascular: Normal rate, regular rhythm and normal heart sounds.   No murmur heard. No carotid bruit .  Trace bl LE edema Pulmonary/Chest: Effort normal and breath sounds normal. No respiratory distress. No has no wheezes. No rales.  Skin: Skin is warm and dry. Not diaphoretic.  Psychiatric: Normal mood and affect. Behavior is normal.      Assessment & Plan:    See Problem List for Assessment and Plan of chronic medical problems.

## 2021-08-20 ENCOUNTER — Ambulatory Visit (INDEPENDENT_AMBULATORY_CARE_PROVIDER_SITE_OTHER): Payer: Medicare Other | Admitting: Internal Medicine

## 2021-08-20 ENCOUNTER — Other Ambulatory Visit: Payer: Self-pay

## 2021-08-20 VITALS — BP 126/80 | HR 68 | Temp 98.1°F | Ht 70.0 in | Wt 202.0 lb

## 2021-08-20 DIAGNOSIS — I10 Essential (primary) hypertension: Secondary | ICD-10-CM

## 2021-08-20 DIAGNOSIS — E1169 Type 2 diabetes mellitus with other specified complication: Secondary | ICD-10-CM

## 2021-08-20 DIAGNOSIS — E7849 Other hyperlipidemia: Secondary | ICD-10-CM | POA: Diagnosis not present

## 2021-08-20 DIAGNOSIS — Z8719 Personal history of other diseases of the digestive system: Secondary | ICD-10-CM | POA: Diagnosis not present

## 2021-08-20 LAB — COMPREHENSIVE METABOLIC PANEL
ALT: 27 U/L (ref 0–53)
AST: 24 U/L (ref 0–37)
Albumin: 4.1 g/dL (ref 3.5–5.2)
Alkaline Phosphatase: 53 U/L (ref 39–117)
BUN: 13 mg/dL (ref 6–23)
CO2: 30 mEq/L (ref 19–32)
Calcium: 9.9 mg/dL (ref 8.4–10.5)
Chloride: 100 mEq/L (ref 96–112)
Creatinine, Ser: 0.91 mg/dL (ref 0.40–1.50)
GFR: 85.46 mL/min (ref >60.00)
Glucose, Bld: 137 mg/dL — ABNORMAL HIGH (ref 70–99)
Potassium: 4.5 mEq/L (ref 3.5–5.1)
Sodium: 136 mEq/L (ref 135–145)
Total Bilirubin: 0.7 mg/dL (ref 0.2–1.2)
Total Protein: 6.9 g/dL (ref 6.0–8.3)

## 2021-08-20 LAB — LIPID PANEL
Cholesterol: 103 mg/dL (ref 0–200)
HDL: 36.5 mg/dL — ABNORMAL LOW (ref >39.00)
LDL Cholesterol: 47 mg/dL (ref 0–99)
NonHDL: 66.68
Total CHOL/HDL Ratio: 3
Triglycerides: 96 mg/dL (ref 0.0–149.0)
VLDL: 19.2 mg/dL (ref 0.0–40.0)

## 2021-08-20 LAB — HEMOGLOBIN A1C: Hgb A1c MFr Bld: 8 % — ABNORMAL HIGH (ref 4.6–6.5)

## 2021-08-20 NOTE — Assessment & Plan Note (Signed)
Chronic With hyperlipidemia Lab Results  Component Value Date   HGBA1C 13.2 (H) 05/13/2021   Poorly controlled 3 months ago He has revised his lifestyle Continue metformin 1500 mg XR daily Check A1c-we will adjust medication if needed

## 2021-08-20 NOTE — Assessment & Plan Note (Signed)
Chronic Blood pressure well controlled CMP Continue hydrochlorothiazide 25 mg daily, losartan 100 mg daily 

## 2021-08-20 NOTE — Assessment & Plan Note (Signed)
Chronic Regular exercise and healthy diet encouraged Check lipid panel  Continue rosuvastatin 5 mg daily 

## 2021-08-20 NOTE — Assessment & Plan Note (Signed)
History of GI bleed No symptoms consistent with GI bleed Continue pantoprazole 40 mg daily

## 2021-08-21 DIAGNOSIS — M25552 Pain in left hip: Secondary | ICD-10-CM | POA: Diagnosis not present

## 2021-08-21 DIAGNOSIS — M6281 Muscle weakness (generalized): Secondary | ICD-10-CM | POA: Diagnosis not present

## 2021-08-21 DIAGNOSIS — M7062 Trochanteric bursitis, left hip: Secondary | ICD-10-CM | POA: Diagnosis not present

## 2021-08-22 ENCOUNTER — Other Ambulatory Visit: Payer: Self-pay | Admitting: Internal Medicine

## 2021-08-27 DIAGNOSIS — M7062 Trochanteric bursitis, left hip: Secondary | ICD-10-CM | POA: Diagnosis not present

## 2021-08-27 DIAGNOSIS — M25552 Pain in left hip: Secondary | ICD-10-CM | POA: Diagnosis not present

## 2021-08-27 DIAGNOSIS — M6281 Muscle weakness (generalized): Secondary | ICD-10-CM | POA: Diagnosis not present

## 2021-08-30 DIAGNOSIS — M7062 Trochanteric bursitis, left hip: Secondary | ICD-10-CM | POA: Diagnosis not present

## 2021-08-30 DIAGNOSIS — M25552 Pain in left hip: Secondary | ICD-10-CM | POA: Diagnosis not present

## 2021-08-30 DIAGNOSIS — M6281 Muscle weakness (generalized): Secondary | ICD-10-CM | POA: Diagnosis not present

## 2021-09-05 DIAGNOSIS — M6281 Muscle weakness (generalized): Secondary | ICD-10-CM | POA: Diagnosis not present

## 2021-09-05 DIAGNOSIS — M25552 Pain in left hip: Secondary | ICD-10-CM | POA: Diagnosis not present

## 2021-09-05 DIAGNOSIS — M7062 Trochanteric bursitis, left hip: Secondary | ICD-10-CM | POA: Diagnosis not present

## 2021-09-10 DIAGNOSIS — M6281 Muscle weakness (generalized): Secondary | ICD-10-CM | POA: Diagnosis not present

## 2021-09-10 DIAGNOSIS — M25552 Pain in left hip: Secondary | ICD-10-CM | POA: Diagnosis not present

## 2021-09-10 DIAGNOSIS — M7062 Trochanteric bursitis, left hip: Secondary | ICD-10-CM | POA: Diagnosis not present

## 2021-09-26 ENCOUNTER — Other Ambulatory Visit: Payer: Self-pay | Admitting: Internal Medicine

## 2021-10-18 ENCOUNTER — Other Ambulatory Visit: Payer: Self-pay | Admitting: Internal Medicine

## 2021-10-18 DIAGNOSIS — I10 Essential (primary) hypertension: Secondary | ICD-10-CM

## 2021-11-12 ENCOUNTER — Ambulatory Visit: Payer: Medicare Other | Admitting: Internal Medicine

## 2022-02-05 ENCOUNTER — Telehealth: Payer: Self-pay | Admitting: Internal Medicine

## 2022-02-05 NOTE — Telephone Encounter (Signed)
LVM for pt to rtn my call to schedule AWV with [facility]. Call back # [phone] 

## 2022-02-17 ENCOUNTER — Ambulatory Visit (INDEPENDENT_AMBULATORY_CARE_PROVIDER_SITE_OTHER): Payer: Medicare PPO

## 2022-02-17 VITALS — Wt 202.0 lb

## 2022-02-17 DIAGNOSIS — Z0000 Encounter for general adult medical examination without abnormal findings: Secondary | ICD-10-CM

## 2022-02-17 NOTE — Patient Instructions (Signed)
[name] , Thank you for taking time to come for your Medicare Wellness Visit. I appreciate your ongoing commitment to your health goals. Please review the following plan we discussed and let me know if I can assist you in the future.   Screening recommendations/referrals: Colonoscopy: 01/11/16 Recommended yearly ophthalmology/optometry visit for glaucoma screening and checkup Recommended yearly dental visit for hygiene and checkup  Vaccinations: Influenza vaccine: n/d Pneumococcal vaccine: 12/25/17 Tdap vaccine: 02/03/12, due when have an injury Shingles vaccine: n/d   Covid-19: n/d  Advanced directives: no  Conditions/risks identified: none  Next appointment: Follow up in one year for your annual wellness visit. 02/20/23 @ 8 am by phone  Preventive Care 65 Years and Older, Male Preventive care refers to lifestyle choices and visits with your health care provider that can promote health and wellness. What does preventive care include? A yearly physical exam. This is also called an annual well check. Dental exams once or twice a year. Routine eye exams. Ask your health care provider how often you should have your eyes checked. Personal lifestyle choices, including: Daily care of your teeth and gums. Regular physical activity. Eating a healthy diet. Avoiding tobacco and drug use. Limiting alcohol use. Practicing safe sex. Taking low doses of aspirin every day. Taking vitamin and mineral supplements as recommended by your health care provider. What happens during an annual well check? The services and screenings done by your health care provider during your annual well check will depend on your age, overall health, lifestyle risk factors, and family history of disease. Counseling  Your health care provider may ask you questions about your: Alcohol use. Tobacco use. Drug use. Emotional well-being. Home and relationship well-being. Sexual activity. Eating habits. History of  falls. Memory and ability to understand (cognition). Work and work Statistician. Screening  You may have the following tests or measurements: Height, weight, and BMI. Blood pressure. Lipid and cholesterol levels. These may be checked every 5 years, or more frequently if you are over 40 years old. Skin check. Lung cancer screening. You may have this screening every year starting at age 79 if you have a 30-pack-year history of smoking and currently smoke or have quit within the past 15 years. Fecal occult blood test (FOBT) of the stool. You may have this test every year starting at age 3. Flexible sigmoidoscopy or colonoscopy. You may have a sigmoidoscopy every 5 years or a colonoscopy every 10 years starting at age 7. Prostate cancer screening. Recommendations will vary depending on your family history and other risks. Hepatitis C blood test. Hepatitis B blood test. Sexually transmitted disease (STD) testing. Diabetes screening. This is done by checking your blood sugar (glucose) after you have not eaten for a while (fasting). You may have this done every 1-3 years. Abdominal aortic aneurysm (AAA) screening. You may need this if you are a current or former smoker. Osteoporosis. You may be screened starting at age 16 if you are at high risk. Talk with your health care provider about your test results, treatment options, and if necessary, the need for more tests. Vaccines  Your health care provider may recommend certain vaccines, such as: Influenza vaccine. This is recommended every year. Tetanus, diphtheria, and acellular pertussis (Tdap, Td) vaccine. You may need a Td booster every 10 years. Zoster vaccine. You may need this after age 80. Pneumococcal 13-valent conjugate (PCV13) vaccine. One dose is recommended after age 67. Pneumococcal polysaccharide (PPSV23) vaccine. One dose is recommended after age 46. Talk to  your health care provider about which screenings and vaccines you need and  how often you need them. This information is not intended to replace advice given to you by your health care provider. Make sure you discuss any questions you have with your health care provider. Document Released: 09/28/2015 Document Revised: 05/21/2016 Document Reviewed: 07/03/2015 Elsevier Interactive Patient Education  2017 [employer] Prevention in the Home Falls can cause injuries. They can happen to people of all ages. There are many things you can do to make your home safe and to help prevent falls. What can I do on the outside of my home? Regularly fix the edges of walkways and driveways and fix any cracks. Remove anything that might make you trip as you walk through a door, such as a raised step or threshold. Trim any bushes or trees on the path to your home. Use bright outdoor lighting. Clear any walking paths of anything that might make someone trip, such as rocks or tools. Regularly check to see if handrails are loose or broken. Make sure that both sides of any steps have handrails. Any raised decks and porches should have guardrails on the edges. Have any leaves, snow, or ice cleared regularly. Use sand or salt on walking paths during winter. Clean up any spills in your garage right away. This includes oil or grease spills. What can I do in the bathroom? Use night lights. Install grab bars by the toilet and in the tub and shower. Do not use towel bars as grab bars. Use non-skid mats or decals in the tub or shower. If you need to sit down in the shower, use a plastic, non-slip stool. Keep the floor dry. Clean up any water that spills on the floor as soon as it happens. Remove soap buildup in the tub or shower regularly. Attach bath mats securely with double-sided non-slip rug tape. Do not have throw rugs and other things on the floor that can make you trip. What can I do in the bedroom? Use night lights. Make sure that you have a light by your bed that is easy to  reach. Do not use any sheets or blankets that are too big for your bed. They should not hang down onto the floor. Have a firm chair that has side arms. You can use this for support while you get dressed. Do not have throw rugs and other things on the floor that can make you trip. What can I do in the kitchen? Clean up any spills right away. Avoid walking on wet floors. Keep items that you use a lot in easy-to-reach places. If you need to reach something above you, use a strong step stool that has a grab bar. Keep electrical cords out of the way. Do not use floor polish or wax that makes floors slippery. If you must use wax, use non-skid floor wax. Do not have throw rugs and other things on the floor that can make you trip. What can I do with my stairs? Do not leave any items on the stairs. Make sure that there are handrails on both sides of the stairs and use them. Fix handrails that are broken or loose. Make sure that handrails are as long as the stairways. Check any carpeting to make sure that it is firmly attached to the stairs. Fix any carpet that is loose or worn. Avoid having throw rugs at the top or bottom of the stairs. If you do have throw rugs, attach  them to the floor with carpet tape. Make sure that you have a light switch at the top of the stairs and the bottom of the stairs. If you do not have them, ask someone to add them for you. What else can I do to help prevent falls? Wear shoes that: Do not have high heels. Have rubber bottoms. Are comfortable and fit you well. Are closed at the toe. Do not wear sandals. If you use a stepladder: Make sure that it is fully opened. Do not climb a closed stepladder. Make sure that both sides of the stepladder are locked into place. Ask someone to hold it for you, if possible. Clearly mark and make sure that you can see: Any grab bars or handrails. First and last steps. Where the edge of each step is. Use tools that help you move  around (mobility aids) if they are needed. These include: Canes. Walkers. Scooters. Crutches. Turn on the lights when you go into a dark area. Replace any light bulbs as soon as they burn out. Set up your furniture so you have a clear path. Avoid moving your furniture around. If any of your floors are uneven, fix them. If there are any pets around you, be aware of where they are. Review your medicines with your doctor. Some medicines can make you feel dizzy. This can increase your chance of falling. Ask your doctor what other things that you can do to help prevent falls. This information is not intended to replace advice given to you by your health care provider. Make sure you discuss any questions you have with your health care provider. Document Released: 06/28/2009 Document Revised: 02/07/2016 Document Reviewed: 10/06/2014 Elsevier Interactive Patient Education  2017 [employer].

## 2022-02-17 NOTE — Progress Notes (Signed)
Virtual Visit via Telephone Note  I connected with  Claude Waldman Guerry on 02/17/22 at  9:15 AM EDT by telephone and verified that I am speaking with the correct person using two identifiers.  Location: Patient: home Provider: Ether Griffins Persons participating in the virtual visit: [place]   I discussed the limitations, risks, security and privacy concerns of performing an evaluation and management service by telephone and the availability of in person appointments. The patient expressed understanding and agreed to proceed.  Interactive audio and video telecommunications were attempted between this nurse and patient, however failed, due to patient having technical difficulties OR patient did not have access to video capability.  We continued and completed visit with audio only.  Some vital signs may be absent or patient reported.   Dionisio David, LPN  Subjective:   MARQUAIL BRADWELL is a 71 y.o. male who presents for Medicare Annual/Subsequent preventive examination.  Review of Systems     Cardiac Risk Factors include: advanced age (>81mn, >>34women);diabetes mellitus;male gender     Objective:    There were no vitals filed for this visit. There is no height or weight on file to calculate BMI.     02/17/2022    9:24 AM 10/29/2015   12:00 AM 10/28/2015    4:25 PM  Advanced Directives  Does Patient Have a Medical Advance Directive? No Yes No  Type of Advance Directive  Living will   Does patient want to make changes to medical advance directive?  No - Patient declined   [place]?  Yes   Would patient like information on creating a medical advance directive? No - Patient declined No - patient declined information No - patient declined information    Current Medications (verified) Outpatient Encounter Medications as of 02/17/2022  Medication Sig   b complex vitamins tablet Take 1 tablet by mouth daily.   Cholecalciferol  (VITAMIN D3) 50 MCG (2000 UT) capsule Take by mouth.   hydrochlorothiazide (HYDRODIURIL) 25 MG tablet TAKE 1 TABLET BY MOUTH EVERY DAY   losartan (COZAAR) 100 MG tablet TAKE 1 TABLET BY MOUTH EVERY DAY   metFORMIN (GLUCOPHAGE-XR) 500 MG 24 hr tablet Take 3 tablets (1,500 mg total) by mouth daily with breakfast.   metroNIDAZOLE (METROCREAM) 0.75 % cream    Omega-3 Fatty Acids (OMEGA 3 PO) Take 1 tablet by mouth daily.    pantoprazole (PROTONIX) 40 MG tablet Take 1 tablet (40 mg total) by mouth daily.   rosuvastatin (CRESTOR) 5 MG tablet TAKE 1 TABLET BY MOUTH EVERY DAY   Sulfacetamide Sodium-Sulfur 9.8-4.8 % LIQD    azithromycin (ZITHROMAX) 250 MG tablet TAKE 2 TABLETS BY MOUTH TODAY, THEN TAKE 1 TABLET DAILY FOR 4 DAYS (Patient not taking: Reported on 02/17/2022)   No facility-administered encounter medications on file as of 02/17/2022.    Allergies (verified) Patient has no known allergies.   History: Past Medical History:  Diagnosis Date   Hyperlipidemia    LDL goal = < 110   Hypertension    [facility]state [facility]   [facility]  Past Surgical History:  Procedure Laterality Date   CERVICAL FUSION  2007   C5-C7 , Dr CSaintclair Halsted   COLONOSCOPY  2006   negative; [place], [place]   ESOPHAGOGASTRODUODENOSCOPY Left 10/28/2015   Procedure: ESOPHAGOGASTRODUODENOSCOPY (EGD);  Surgeon: JTeena Irani MD;  [facility];  Service: Endoscopy;  Laterality: Left;   PROSTATECTOMY  11/2007  Dr.Moul, [name]   [place] bone resection 2006   Family History  Problem Relation Age of Onset   Breast cancer Mother    Alzheimer's disease Mother    Colon cancer Paternal Grandmother    Diabetes Paternal Grandfather    Heart attack Paternal Grandfather 49   Breast cancer Maternal Grandmother    Heart attack Maternal Grandfather 48   Heart disease Father        Atrial fibrillation   Heart failure Father    Stroke Neg Hx    Social History   Socioeconomic History   Marital  status: Married    Spouse name: Not on file   Number of children: 3   Years of education: Not on file   Highest education level: Not on file  Occupational History   Occupation: Teacher/Coach  Tobacco Use   Smoking status: Never   Smokeless tobacco: Never  Substance and Sexual Activity   Alcohol use: No   Drug use: No   Sexual activity: Not on file  Other Topics Concern   Not on file  Social History Narrative   Not on file   Social Determinants of Health   Financial Resource Strain: Low Risk    Difficulty of Paying Living Expenses: Not hard at all  Food Insecurity: No Food Insecurity   Worried About Charity fundraiser in the Last Year: Never true   Ran Out of Food in the Last Year: Never true  Transportation Needs: No Transportation Needs   Lack of Transportation (Medical): No   Lack of Transportation (Non-Medical): No  Physical Activity: Insufficiently Active   Days of Exercise per Week: 3 days   Minutes of Exercise per Session: 30 min  Stress: No Stress Concern Present   Feeling of Stress : Not at all  Social Connections: Moderately Integrated   Frequency of Communication with Friends and Family: More than three times a week   Frequency of Social Gatherings with Friends and Family: Once a week   Attends Religious Services: More than 4 times per year   Active Member of [employer] or Organizations: No   Attends Music therapist: Never   Marital Status: Married    Tobacco Counseling Counseling given: Not Answered   Clinical Intake:  Pre-visit preparation completed: Yes  Pain : No/denies pain     Diabetes: Yes CBG done?: No Did pt. bring in CBG monitor from home?: No  How often do you need to have someone help you when you read instructions, pamphlets, or other written materials from your doctor or pharmacy?: 1 - Never  Diabetic?yes Nutrition Risk Assessment:  Has the patient had any N/V/D within the last 2 months?  No  Does the patient have any  non-healing wounds?  No  Has the patient had any unintentional weight loss or weight gain?  No   Diabetes:  Is the patient diabetic?  Yes  If diabetic, was a CBG obtained today?  No  Did the patient bring in their glucometer from home?  No  How often do you monitor your CBG's? never  Financial Strains and Diabetes Management:  Are you having any financial strains with the device, your supplies or your medication? No .  Does the patient want to be seen by Chronic Care Management for management of their diabetes?  No  Would the patient like to be referred to a Nutritionist or for Diabetic Management?  No   Diabetic Exams:  Diabetic  Eye Exam: Completed January 2023 by Dr. Hulan Saas. Overdue for diabetic eye exam. Pt has been advised about the importance in completing this exam.   Diabetic Foot Exam: Completed 03/25/17. Pt has been advised about the importance in completing this exam.       Information entered by :: Kirke Shaggy, LPN   Activities of Daily Living    02/17/2022    9:25 AM  In your present state of health, do you have any difficulty performing the following activities:  Hearing? 0  Vision? 0  Difficulty concentrating or making decisions? 0  Walking or climbing stairs? 0  Dressing or bathing? 0  Doing errands, shopping? 0  Preparing Food and eating ? N  Using the Toilet? N  In the past six months, have you accidently leaked urine? N  Do you have problems with loss of bowel control? N  Managing your Medications? N  Managing your Finances? N  Housekeeping or managing your Housekeeping? N    Patient Care Team: Binnie Rail, MD as PCP - General (Internal Medicine)  Indicate any recent Medical Services you may have received from other than [facility] providers in the past year (date may be approximate).     Assessment:   This is a routine wellness examination for [employer].  Hearing/Vision screen No results found.  Dietary issues and exercise activities  discussed: Current Exercise Habits: Home exercise routine, Type of exercise: walking, Time (Minutes): 30, Frequency (Times/Week): 3, Weekly Exercise (Minutes/Week): 90, Intensity: Mild   Goals Addressed             This Visit's Progress    DIET - EAT MORE FRUITS AND VEGETABLES         Depression Screen    02/17/2022    9:23 AM 11/12/2020    8:08 AM 11/10/2019    8:37 AM 06/26/2017   12:22 PM 06/20/2016    8:05 AM  PHQ 2/9 Scores  PHQ - 2 Score 0 0 0 0 0  PHQ- 9 Score 0        Fall Risk    02/17/2022    9:25 AM 11/12/2020    8:05 AM 11/10/2019    8:37 AM 04/14/2019    3:17 PM 12/25/2017    9:30 AM  Fall Risk   Falls in the past year? 0 0 0 0 No  Comment    Emmi Telephone Survey: data to providers prior to load   Number falls in past yr: 0 0 0    Injury with Fall? 0 0 0    Risk for fall due to : No Fall Risks      Follow up Falls evaluation completed Falls evaluation completed       [place]:  Any stairs in or around the home? Yes  If so, are there any without handrails? No  Home free of loose throw rugs in walkways, pet beds, electrical cords, etc? Yes  Adequate lighting in your home to reduce risk of falls? Yes   ASSISTIVE DEVICES UTILIZED TO PREVENT FALLS:  Life alert? No  Use of a cane, walker or w/c? No  Grab bars in the bathroom? No  Shower chair or bench in shower? Yes  Elevated toilet seat or a handicapped toilet? No     Cognitive Function:        02/17/2022    9:29 AM  6CIT Screen  What Year? 0 points  What month? 0 points  What time?  0 points  Count back from 20 0 points  Months in reverse 0 points  Repeat phrase 0 points  Total Score 0 points    Immunizations Immunization History  Administered Date(s) Administered   Influenza, High Dose Seasonal PF 06/20/2016, 06/26/2017, 06/18/2018   Pneumococcal Conjugate-13 09/25/2016   Pneumococcal Polysaccharide-23 12/25/2017   Tdap 02/03/2012    TDAP status: Due,  Education has been provided regarding the importance of this vaccine. Advised may receive this vaccine at local pharmacy or Health Dept. Aware to provide a copy of the vaccination record if obtained from local pharmacy or Health Dept. Verbalized acceptance and understanding.  Flu Vaccine status: Declined, Education has been provided regarding the importance of this vaccine but patient still declined. Advised may receive this vaccine at local pharmacy or Health Dept. Aware to provide a copy of the vaccination record if obtained from local pharmacy or Health Dept. Verbalized acceptance and understanding.  Pneumococcal vaccine status: Up to date  Covid-19 vaccine status: Declined, Education has been provided regarding the importance of this vaccine but patient still declined. Advised may receive this vaccine at local pharmacy or Health Dept.or vaccine clinic. Aware to provide a copy of the vaccination record if obtained from local pharmacy or Health Dept. Verbalized acceptance and understanding.  Qualifies for Shingles Vaccine? Yes   Zostavax completed No   Shingrix Completed?: No.    Education has been provided regarding the importance of this vaccine. Patient has been advised to call insurance company to determine out of pocket expense if they have not yet received this vaccine. Advised may also receive vaccine at local pharmacy or Health Dept. Verbalized acceptance and understanding.  Screening Tests Health Maintenance  Topic Date Due   COVID-19 Vaccine (1) Never done   Zoster Vaccines- Shingrix (1 of 2) Never done   FOOT EXAM  03/25/2018   OPHTHALMOLOGY EXAM  03/26/2019   TETANUS/TDAP  02/02/2022   HEMOGLOBIN A1C  02/18/2022   INFLUENZA VACCINE  04/15/2022   COLONOSCOPY (Pts 45-103yr Insurance coverage will need to be confirmed)  01/10/2026   Pneumonia Vaccine 71 Years old  Completed   Hepatitis C Screening  Completed   HPV VACCINES  Aged Out    Health Maintenance  Health Maintenance  Due  Topic Date Due   COVID-19 Vaccine (1) Never done   Zoster Vaccines- Shingrix (1 of 2) Never done   FOOT EXAM  03/25/2018   OPHTHALMOLOGY EXAM  03/26/2019   TETANUS/TDAP  02/02/2022    Colorectal cancer screening: Type of screening: Colonoscopy. Completed 01/11/16. Repeat every 10 years  Lung Cancer Screening: (Low Dose CT Chest recommended if Age 71-80years, 30 pack-year currently smoking OR have quit w/in 15years.) does not qualify.   Additional Screening:  Hepatitis C Screening: does qualify; Completed 06/20/16  Vision Screening: Recommended annual ophthalmology exams for early detection of glaucoma and other disorders of the eye. Is the patient up to date with their annual eye exam?  Yes  Who is the provider or what is the name of the office in which the patient attends annual eye exams? Dr.Cranford If pt is not established with a provider, would they like to be referred to a provider to establish care? No .   Dental Screening: Recommended annual dental exams for proper oral hygiene  Community Resource Referral / Chronic Care Management: CRR required this visit?  No   CCM required this visit?  No      Plan:     I have personally  reviewed and noted the following in the patient's chart:   Medical and social history Use of alcohol, tobacco or illicit drugs  Current medications and supplements including opioid prescriptions. Patient is not currently taking opioid prescriptions. Functional ability and status Nutritional status Physical activity Advanced directives List of other physicians Hospitalizations, surgeries, and ER visits in previous 12 months Vitals Screenings to include cognitive, depression, and falls Referrals and appointments  In addition, I have reviewed and discussed with patient certain preventive protocols, quality metrics, and best practice recommendations. A written personalized care plan for preventive services as well as general preventive  health recommendations were provided to patient.     Dionisio David, LPN   12/21/1857   Nurse Notes: none

## 2022-02-18 ENCOUNTER — Ambulatory Visit: Payer: Medicare Other | Admitting: Internal Medicine

## 2022-02-23 ENCOUNTER — Encounter: Payer: Self-pay | Admitting: Internal Medicine

## 2022-02-23 NOTE — Progress Notes (Unsigned)
Subjective:    Patient ID: Johnny [name], male    DOB: 1950-11-20, 71 y.o.   MRN: [id number]     HPI Johnny [name] is here for follow up of his chronic medical problems, including DM, htn, hld, h/o GIB  He is exercising regularly.   He is doing well with eating healthy.    Medications and allergies reviewed with patient and updated if appropriate.  Current Outpatient Medications on File Prior to Visit  Medication Sig Dispense Refill   b complex vitamins tablet Take 1 tablet by mouth daily.     Cholecalciferol (VITAMIN D3) 50 MCG (2000 UT) capsule Take by mouth.     hydrochlorothiazide (HYDRODIURIL) 25 MG tablet TAKE 1 TABLET BY MOUTH EVERY DAY 90 tablet 1   losartan (COZAAR) 100 MG tablet TAKE 1 TABLET BY MOUTH EVERY DAY 90 tablet 1   metFORMIN (GLUCOPHAGE-XR) 500 MG 24 hr tablet Take 3 tablets (1,500 mg total) by mouth daily with breakfast. 270 tablet 3   metroNIDAZOLE (METROCREAM) 0.75 % cream      Omega-3 Fatty Acids (OMEGA 3 PO) Take 1 tablet by mouth daily.      pantoprazole (PROTONIX) 40 MG tablet Take 1 tablet (40 mg total) by mouth daily. 18090 tablet 1   rosuvastatin (CRESTOR) 5 MG tablet TAKE 1 TABLET BY MOUTH EVERY DAY 90 tablet 1   Sulfacetamide Sodium-Sulfur 9.8-4.8 % LIQD      No current facility-administered medications on file prior to visit.     Review of Systems  Constitutional:  Negative for fever.  Respiratory:  Negative for cough, shortness of breath and wheezing.   Cardiovascular:  Negative for chest pain, palpitations and leg swelling.  Gastrointestinal:  Negative for abdominal pain and blood in stool (no black stool).       Occ gerd symptoms  Neurological:  Negative for light-headedness and headaches.       Objective:   Vitals:   02/24/22 0947  BP: 118/72  Pulse: 74  Temp: 98 F (36.7 C)  SpO2: 99%   BP Readings from Last 3 Encounters:  02/24/22 118/72  08/20/21 126/80  05/21/21 130/76   Wt Readings from Last 3 Encounters:  02/24/22  203 lb (92.1 kg)  02/17/22 202 lb (91.6 kg)  08/20/21 202 lb (91.6 kg)   Body mass index is 29.13 kg/m.    Physical Exam Constitutional:      General: He is not in acute distress.    Appearance: Normal appearance. He is not ill-appearing.  HENT:     Head: Normocephalic and atraumatic.  Eyes:     Conjunctiva/sclera: Conjunctivae normal.  Cardiovascular:     Rate and Rhythm: Normal rate and regular rhythm.     Heart sounds: Normal heart sounds. No murmur heard. Pulmonary:     Effort: Pulmonary effort is normal. No respiratory distress.     Breath sounds: Normal breath sounds. No wheezing or rales.  Musculoskeletal:     Right lower leg: No edema.     Left lower leg: No edema.  Skin:    General: Skin is warm and dry.     Findings: No rash.  Neurological:     Mental Status: He is alert. Mental status is at baseline.  Psychiatric:        Mood and Affect: Mood normal.        Lab Results  Component Value Date   WBC 6.8 05/13/2021   HGB 14.3 05/13/2021   HCT 44.4  05/13/2021   PLT 254.0 05/13/2021   GLUCOSE 137 (H) 08/20/2021   CHOL 103 08/20/2021   TRIG 96.0 08/20/2021   HDL 36.50 (L) 08/20/2021   LDLDIRECT 103.0 06/26/2017   LDLCALC 47 08/20/2021   ALT 27 08/20/2021   AST 24 08/20/2021   NA 136 08/20/2021   K 4.5 08/20/2021   CL 100 08/20/2021   CREATININE 0.91 08/20/2021   BUN 13 08/20/2021   CO2 30 08/20/2021   TSH 1.44 05/13/2021   PSA 0.80 05/13/2021   INR 1.16 10/28/2015   HGBA1C 8.0 (H) 08/20/2021     Assessment & Plan:    See Problem List for Assessment and Plan of chronic medical problems.

## 2022-02-24 ENCOUNTER — Ambulatory Visit (INDEPENDENT_AMBULATORY_CARE_PROVIDER_SITE_OTHER): Payer: Medicare PPO | Admitting: Internal Medicine

## 2022-02-24 VITALS — BP 118/72 | HR 74 | Temp 98.0°F | Ht 70.0 in | Wt 203.0 lb

## 2022-02-24 DIAGNOSIS — E1169 Type 2 diabetes mellitus with other specified complication: Secondary | ICD-10-CM

## 2022-02-24 DIAGNOSIS — Z8719 Personal history of other diseases of the digestive system: Secondary | ICD-10-CM

## 2022-02-24 DIAGNOSIS — I10 Essential (primary) hypertension: Secondary | ICD-10-CM | POA: Diagnosis not present

## 2022-02-24 DIAGNOSIS — E7849 Other hyperlipidemia: Secondary | ICD-10-CM | POA: Diagnosis not present

## 2022-02-24 LAB — COMPREHENSIVE METABOLIC PANEL
ALT: 28 U/L (ref 0–53)
AST: 24 U/L (ref 0–37)
Albumin: 4.1 g/dL (ref 3.5–5.2)
Alkaline Phosphatase: 54 U/L (ref 39–117)
BUN: 12 mg/dL (ref 6–23)
CO2: 26 mEq/L (ref 19–32)
Calcium: 9.7 mg/dL (ref 8.4–10.5)
Chloride: 101 mEq/L (ref 96–112)
Creatinine, Ser: 0.79 mg/dL (ref 0.40–1.50)
GFR: 89.75 mL/min (ref >60.00)
Glucose, Bld: 149 mg/dL — ABNORMAL HIGH (ref 70–99)
Potassium: 3.8 mEq/L (ref 3.5–5.1)
Sodium: 136 mEq/L (ref 135–145)
Total Bilirubin: 0.9 mg/dL (ref 0.2–1.2)
Total Protein: 7 g/dL (ref 6.0–8.3)

## 2022-02-24 LAB — MICROALBUMIN / CREATININE URINE RATIO
Creatinine,U: 136.2 mg/dL
Microalb Creat Ratio: 0.6 mg/g (ref 0.0–30.0)
Microalb, Ur: 0.8 mg/dL (ref 0.0–1.9)

## 2022-02-24 LAB — LIPID PANEL
Cholesterol: 117 mg/dL (ref 0–200)
HDL: 46.5 mg/dL (ref >39.00)
LDL Cholesterol: 53 mg/dL (ref 0–99)
NonHDL: 70
Total CHOL/HDL Ratio: 3
Triglycerides: 87 mg/dL (ref 0.0–149.0)
VLDL: 17.4 mg/dL (ref 0.0–40.0)

## 2022-02-24 LAB — HEMOGLOBIN A1C: Hgb A1c MFr Bld: 6.8 % — ABNORMAL HIGH (ref 4.6–6.5)

## 2022-02-24 NOTE — Assessment & Plan Note (Signed)
Chronic Regular exercise and healthy diet encouraged Check lipid panel  Continue Crestor 5 mg daily 

## 2022-02-24 NOTE — Assessment & Plan Note (Signed)
Chronic  Lab Results  Component Value Date   HGBA1C 8.0 (H) 08/20/2021   Sugars not ideally controlled Testing sugars 1 times a day Check A1c, urine microalbumin today Continue metformin XR 1500 mg with breakfast Stressed regular exercise, diabetic diet

## 2022-02-24 NOTE — Assessment & Plan Note (Signed)
History of GI bleed No symptoms consistent with active GI bleed now Continue pantoprazole 40 mg daily

## 2022-02-24 NOTE — Patient Instructions (Addendum)
     Blood work was ordered.      Medications changes include :   none     Return in about 4 months (around 06/26/2022) for follow up.

## 2022-02-24 NOTE — Assessment & Plan Note (Signed)
Chronic Blood pressure well controlled CMP Continue hydrochlorothiazide 25 mg daily, losartan 100 mg daily 

## 2022-03-23 ENCOUNTER — Other Ambulatory Visit: Payer: Self-pay | Admitting: Internal Medicine

## 2022-04-04 ENCOUNTER — Other Ambulatory Visit: Payer: Self-pay | Admitting: Internal Medicine

## 2022-04-10 ENCOUNTER — Other Ambulatory Visit: Payer: Self-pay | Admitting: Internal Medicine

## 2022-04-10 DIAGNOSIS — I10 Essential (primary) hypertension: Secondary | ICD-10-CM

## 2022-05-12 ENCOUNTER — Other Ambulatory Visit: Payer: Self-pay | Admitting: Internal Medicine

## 2022-05-29 DIAGNOSIS — L719 Rosacea, unspecified: Secondary | ICD-10-CM | POA: Diagnosis not present

## 2022-05-29 DIAGNOSIS — L821 Other seborrheic keratosis: Secondary | ICD-10-CM | POA: Diagnosis not present

## 2022-05-29 DIAGNOSIS — L570 Actinic keratosis: Secondary | ICD-10-CM | POA: Diagnosis not present

## 2022-05-29 DIAGNOSIS — L578 Other skin changes due to chronic exposure to nonionizing radiation: Secondary | ICD-10-CM | POA: Diagnosis not present

## 2022-05-29 DIAGNOSIS — L720 Epidermal cyst: Secondary | ICD-10-CM | POA: Diagnosis not present

## 2022-06-18 NOTE — Progress Notes (Unsigned)
[place] Phone: [phone] Subjective:   [name], am serving as a scribe for Dr. Hulan Saas.  I'm seeing this patient by the request  of:  Binnie Rail, MD  CC: Right hand pain  [id number]  Johnny [name] is a 71 y.o. male coming in with complaint of R hand pain. Last seen in November 2020 for trigger finger. Patient states that [facility] joint has been painful. No injury. Flew out to [place] and he noticed hand pain when trying to get his wallet out of front pocket of his jeans. Pain is less now but still present. Injection in September 2020.        Past Medical History:  Diagnosis Date   Hyperlipidemia    LDL goal = < 110   Hypertension    Prostate cancer [facility]) 2008   Dr Darcus Austin, [facility]   Past Surgical History:  Procedure Laterality Date   CERVICAL FUSION  2007   C5-C7 , Dr Saintclair Halsted    COLONOSCOPY  2006   negative; [place], [place]   ESOPHAGOGASTRODUODENOSCOPY Left 10/28/2015   Procedure: ESOPHAGOGASTRODUODENOSCOPY (EGD);  Surgeon: Teena Irani, MD;  Location: [facility];  Service: Endoscopy;  Laterality: Left;   PROSTATECTOMY  11/2007   Dr.Moul, [place]   [place] bone resection 2006   Social History   Socioeconomic History   Marital status: Married    Spouse name: Not on file   Number of children: 3   Years of education: Not on file   Highest education level: Not on file  Occupational History   Occupation: Teacher/Coach  Tobacco Use   Smoking status: Never   Smokeless tobacco: Never  Substance and Sexual Activity   Alcohol use: No   Drug use: No   Sexual activity: Not on file  Other Topics Concern   Not on file  Social History Narrative   Not on file   Social Determinants of Health   Financial Resource Strain: Low Risk  (02/17/2022)   Overall Financial Resource Strain (CARDIA)    Difficulty of Paying Living Expenses: Not hard at all  Food Insecurity: No Food  Insecurity (02/17/2022)   Hunger Vital Sign    Worried About Running Out of Food in the Last Year: Never true    Ran Out of Food in the Last Year: Never true  Transportation Needs: No Transportation Needs (02/17/2022)   PRAPARE - Hydrologist (Medical): No    Lack of Transportation (Non-Medical): No  Physical Activity: Insufficiently Active (02/17/2022)   Exercise Vital Sign    Days of Exercise per Week: 3 days    Minutes of Exercise per Session: 30 min  Stress: No Stress Concern Present (02/17/2022)   [place]    Feeling of Stress : Not at all  Social Connections: Moderately Integrated (02/17/2022)   Social Connection and Isolation Panel [NHANES]    Frequency of Communication with Friends and Family: More than three times a week    Frequency of Social Gatherings with Friends and Family: Once a week    Attends Religious Services: More than 4 times per year    Active Member of [employer] or Organizations: No    Attends Archivist Meetings: Never    Marital Status: Married   No Known Allergies Family History  Problem Relation Age of Onset   Breast cancer  Mother    Alzheimer's disease Mother    Colon cancer Paternal Grandmother    Diabetes Paternal Grandfather    Heart attack Paternal Grandfather 48   Breast cancer Maternal Grandmother    Heart attack Maternal Grandfather 47   Heart disease Father        Atrial fibrillation   Heart failure Father    Stroke Neg Hx     Current Outpatient Medications (Endocrine & Metabolic):    metFORMIN (GLUCOPHAGE-XR) 500 MG 24 hr tablet, TAKE 3 TABLETS (1,500 MG TOTAL) BY MOUTH DAILY WITH BREAKFAST.  Current Outpatient Medications (Cardiovascular):    hydrochlorothiazide (HYDRODIURIL) 25 MG tablet, TAKE 1 TABLET BY MOUTH EVERY DAY   losartan (COZAAR) 100 MG tablet, TAKE 1 TABLET BY MOUTH EVERY DAY   rosuvastatin (CRESTOR) 5 MG tablet, TAKE 1 TABLET BY  MOUTH EVERY DAY     Current Outpatient Medications (Other):    b complex vitamins tablet, Take 1 tablet by mouth daily.   Cholecalciferol (VITAMIN D3) 50 MCG (2000 UT) capsule, Take by mouth.   metroNIDAZOLE (METROCREAM) 0.75 % cream,    Omega-3 Fatty Acids (OMEGA 3 PO), Take 1 tablet by mouth daily.    pantoprazole (PROTONIX) 40 MG tablet, Take 1 tablet (40 mg total) by mouth daily.   Sulfacetamide Sodium-Sulfur 9.8-4.8 % LIQD,    Reviewed prior external information including notes and imaging from  primary care provider As well as notes that were available from care everywhere and other healthcare systems.  Past medical history, social, surgical and family history all reviewed in electronic medical record.  No pertanent information unless stated regarding to the chief complaint.   Review of Systems:  No headache, visual changes, nausea, vomiting, diarrhea, constipation, dizziness, abdominal pain, skin rash, fevers, chills, night sweats, weight loss, swollen lymph nodes, body aches, joint swelling, chest pain, shortness of breath, mood changes. POSITIVE muscle aches  Objective  Blood pressure 122/82, pulse 65, height '5\' 10"'$  (1.778 m), weight 208 lb (94.3 kg), SpO2 98 %.   General: No apparent distress alert and oriented x3 mood and affect normal, dressed appropriately.  HEENT: Pupils equal, extraocular movements intact  Respiratory: Patient's speak in full sentences and does not appear short of breath  Cardiovascular: No lower extremity edema, non tender, no erythema  Right knee exam shows the patient does have significant swelling of the CMC.  Positive grind test noted.  No trigger nodule noted.  Neurovascularly intact distally.   Procedure: Real-time Ultrasound Guided Injection of right CMC joint Device: GE Logiq Q7 Ultrasound guided injection is preferred based studies that show increased duration, increased effect, greater accuracy, decreased procedural pain, increased  response rate, and decreased cost with ultrasound guided versus blind injection.  Verbal informed consent obtained.  Time-out conducted.  Noted no overlying erythema, induration, or other signs of local infection.  Skin prepped in a sterile fashion.  Local anesthesia: Topical Ethyl chloride.  With sterile technique and under real time ultrasound guidance: With 25-gauge half inch needle injecting 0.5 cc of 0.5% Marcaine and 0.5 cc of Kenalog 40 mg/mL Completed without difficulty  Pain immediately resolved suggesting accurate placement of the medication.  Advised to call if fevers/chills, erythema, induration, drainage, or persistent bleeding.  Impression: Technically successful ultrasound guided injection.   Impression and Recommendations:     The above documentation has been reviewed and is accurate and complete Lyndal Pulley, DO

## 2022-06-19 ENCOUNTER — Encounter: Payer: Self-pay | Admitting: Family Medicine

## 2022-06-19 ENCOUNTER — Ambulatory Visit: Payer: Self-pay

## 2022-06-19 ENCOUNTER — Ambulatory Visit (INDEPENDENT_AMBULATORY_CARE_PROVIDER_SITE_OTHER): Payer: Medicare PPO | Admitting: Family Medicine

## 2022-06-19 VITALS — BP 122/82 | HR 65 | Ht 70.0 in | Wt 208.0 lb

## 2022-06-19 DIAGNOSIS — M79641 Pain in right hand: Secondary | ICD-10-CM

## 2022-06-19 DIAGNOSIS — M1811 Unilateral primary osteoarthritis of first carpometacarpal joint, right hand: Secondary | ICD-10-CM

## 2022-06-19 NOTE — Assessment & Plan Note (Signed)
Chronic problem with worsening symptoms.  Made over 3 years since the last injection.  Encourage patient to continue with potential bracing, icing and topical anti-inflammatories.  Chronic problem with exacerbation.  Follow-up again in 6 to 8 weeks to make sure patient is doing better for further evaluation.

## 2022-06-19 NOTE — Patient Instructions (Signed)
Injection in thumb today Arnica lotion Good to see you! See [place] when you need [place]

## 2022-06-25 ENCOUNTER — Encounter: Payer: Self-pay | Admitting: Internal Medicine

## 2022-06-25 NOTE — Patient Instructions (Addendum)
     Blood work was ordered.     Medications changes include :   none    Return in about 6 months (around 12/26/2022) for Physical Exam.

## 2022-06-25 NOTE — Progress Notes (Signed)
Subjective:    Patient ID: Johnny [name], male    DOB: May 27, 1951, 71 y.o.   MRN: [id number]     HPI Johnny [name] is here for follow up of his chronic medical problems, including DM, htn, hld, h/o GIB, has barretts  He is exercising  - playing golf - gets over 10,000 3-4 times a week.    Medications and allergies reviewed with patient and updated if appropriate.  Current Outpatient Medications on File Prior to Visit  Medication Sig Dispense Refill   b complex vitamins tablet Take 1 tablet by mouth daily.     Cholecalciferol (VITAMIN D3) 50 MCG (2000 UT) capsule Take by mouth.     hydrochlorothiazide (HYDRODIURIL) 25 MG tablet TAKE 1 TABLET BY MOUTH EVERY DAY 90 tablet 1   losartan (COZAAR) 100 MG tablet TAKE 1 TABLET BY MOUTH EVERY DAY 90 tablet 1   metFORMIN (GLUCOPHAGE-XR) 500 MG 24 hr tablet TAKE 3 TABLETS (1,500 MG TOTAL) BY MOUTH DAILY WITH BREAKFAST. 270 tablet 3   metroNIDAZOLE (METROCREAM) 0.75 % cream      Omega-3 Fatty Acids (OMEGA 3 PO) Take 1 tablet by mouth daily.      pantoprazole (PROTONIX) 40 MG tablet Take 1 tablet (40 mg total) by mouth daily. 18090 tablet 1   rosuvastatin (CRESTOR) 5 MG tablet TAKE 1 TABLET BY MOUTH EVERY DAY 90 tablet 1   Sulfacetamide Sodium-Sulfur 9.8-4.8 % LIQD      No current facility-administered medications on file prior to visit.     Review of Systems  Constitutional:  Negative for fever.  Respiratory:  Negative for cough, shortness of breath and wheezing.   Cardiovascular:  Negative for chest pain, palpitations and leg swelling.  Gastrointestinal:  Negative for abdominal pain, blood in stool (no black stool) and nausea.       No gerd  Neurological:  Negative for light-headedness and headaches.       Objective:   Vitals:   06/26/22 1035  BP: 120/80  Pulse: (!) 58  Temp: 97.8 F (36.6 C)  SpO2: 98%   BP Readings from Last 3 Encounters:  06/26/22 120/80  06/19/22 122/82  02/24/22 118/72   Wt Readings from Last 3  Encounters:  06/26/22 210 lb (95.3 kg)  06/19/22 208 lb (94.3 kg)  02/24/22 203 lb (92.1 kg)   Body mass index is 30.13 kg/m.    Physical Exam Constitutional:      General: He is not in acute distress.    Appearance: Normal appearance. He is not ill-appearing.  HENT:     Head: Normocephalic and atraumatic.  Eyes:     Conjunctiva/sclera: Conjunctivae normal.  Cardiovascular:     Rate and Rhythm: Normal rate and regular rhythm.     Heart sounds: Normal heart sounds. No murmur heard. Pulmonary:     Effort: Pulmonary effort is normal. No respiratory distress.     Breath sounds: Normal breath sounds. No wheezing or rales.  Musculoskeletal:     Right lower leg: No edema.     Left lower leg: No edema.  Skin:    General: Skin is warm and dry.     Findings: No rash.  Neurological:     Mental Status: He is alert. Mental status is at baseline.  Psychiatric:        Mood and Affect: Mood normal.        Lab Results  Component Value Date   WBC 6.8 05/13/2021   HGB 14.3  05/13/2021   HCT 44.4 05/13/2021   PLT 254.0 05/13/2021   GLUCOSE 149 (H) 02/24/2022   CHOL 117 02/24/2022   TRIG 87.0 02/24/2022   HDL 46.50 02/24/2022   LDLDIRECT 103.0 06/26/2017   LDLCALC 53 02/24/2022   ALT 28 02/24/2022   AST 24 02/24/2022   NA 136 02/24/2022   K 3.8 02/24/2022   CL 101 02/24/2022   CREATININE 0.79 02/24/2022   BUN 12 02/24/2022   CO2 26 02/24/2022   TSH 1.44 05/13/2021   PSA 0.80 05/13/2021   INR 1.16 10/28/2015   HGBA1C 6.8 (H) 02/24/2022   MICROALBUR 0.8 02/24/2022     Assessment & Plan:    See Problem List for Assessment and Plan of chronic medical problems.

## 2022-06-26 ENCOUNTER — Ambulatory Visit: Payer: Medicare PPO | Admitting: Internal Medicine

## 2022-06-26 VITALS — BP 120/80 | HR 58 | Temp 97.8°F | Ht 70.0 in | Wt 210.0 lb

## 2022-06-26 DIAGNOSIS — E7849 Other hyperlipidemia: Secondary | ICD-10-CM | POA: Diagnosis not present

## 2022-06-26 DIAGNOSIS — E1169 Type 2 diabetes mellitus with other specified complication: Secondary | ICD-10-CM

## 2022-06-26 DIAGNOSIS — K2270 Barrett's esophagus without dysplasia: Secondary | ICD-10-CM | POA: Insufficient documentation

## 2022-06-26 DIAGNOSIS — Z8719 Personal history of other diseases of the digestive system: Secondary | ICD-10-CM

## 2022-06-26 DIAGNOSIS — Z8546 Personal history of malignant neoplasm of prostate: Secondary | ICD-10-CM | POA: Diagnosis not present

## 2022-06-26 DIAGNOSIS — I10 Essential (primary) hypertension: Secondary | ICD-10-CM

## 2022-06-26 LAB — COMPREHENSIVE METABOLIC PANEL
ALT: 21 U/L (ref 0–53)
AST: 20 U/L (ref 0–37)
Albumin: 4 g/dL (ref 3.5–5.2)
Alkaline Phosphatase: 53 U/L (ref 39–117)
BUN: 16 mg/dL (ref 6–23)
CO2: 26 mEq/L (ref 19–32)
Calcium: 9.2 mg/dL (ref 8.4–10.5)
Chloride: 103 mEq/L (ref 96–112)
Creatinine, Ser: 0.82 mg/dL (ref 0.40–1.50)
GFR: 88.54 mL/min (ref >60.00)
Glucose, Bld: 133 mg/dL — ABNORMAL HIGH (ref 70–99)
Potassium: 4.2 mEq/L (ref 3.5–5.1)
Sodium: 137 mEq/L (ref 135–145)
Total Bilirubin: 0.6 mg/dL (ref 0.2–1.2)
Total Protein: 6.7 g/dL (ref 6.0–8.3)

## 2022-06-26 LAB — PSA, MEDICARE: PSA: 1.16 ng/ml (ref 0.10–4.00)

## 2022-06-26 LAB — HEMOGLOBIN A1C: Hgb A1c MFr Bld: 7.1 % — ABNORMAL HIGH (ref 4.6–6.5)

## 2022-06-26 NOTE — Assessment & Plan Note (Signed)
Chronic Following with [facility] We will check PSA so he does not have to have it drawn there

## 2022-06-26 NOTE — Assessment & Plan Note (Signed)
Chronic BP well controlled Continue hctz 25 mg daily, losartan 100 mg daily cmp  

## 2022-06-26 NOTE — Assessment & Plan Note (Signed)
Chronic H/o UGIB - ulcer GERD controlled Continue pantoprazole 40 mg daily

## 2022-06-26 NOTE — Assessment & Plan Note (Addendum)
Chronic H/o Barrett's esophagus Up-to-date with EGD per patient Denies GERD, dysphagia Follows with GI yearly

## 2022-06-26 NOTE — Assessment & Plan Note (Signed)
Chronic  Lab Results  Component Value Date   HGBA1C 6.8 (H) 02/24/2022   Sugars well controlled Testing sugars 0 times a day Check A1c Continue metformin 1500 mg xr daily Stressed regular exercise, diabetic diet

## 2022-06-26 NOTE — Assessment & Plan Note (Addendum)
Chronic Check lipid panel  Continue crestor 5 mg daily Regular exercise and healthy diet encouraged  

## 2022-07-04 DIAGNOSIS — K317 Polyp of stomach and duodenum: Secondary | ICD-10-CM | POA: Diagnosis not present

## 2022-07-04 DIAGNOSIS — Z1211 Encounter for screening for malignant neoplasm of colon: Secondary | ICD-10-CM | POA: Diagnosis not present

## 2022-07-04 DIAGNOSIS — K219 Gastro-esophageal reflux disease without esophagitis: Secondary | ICD-10-CM | POA: Diagnosis not present

## 2022-07-04 DIAGNOSIS — K2270 Barrett's esophagus without dysplasia: Secondary | ICD-10-CM | POA: Diagnosis not present

## 2022-07-10 DIAGNOSIS — C61 Malignant neoplasm of prostate: Secondary | ICD-10-CM | POA: Diagnosis not present

## 2022-07-10 DIAGNOSIS — Z87440 Personal history of urinary (tract) infections: Secondary | ICD-10-CM | POA: Diagnosis not present

## 2022-07-10 DIAGNOSIS — Z79899 Other long term (current) drug therapy: Secondary | ICD-10-CM | POA: Diagnosis not present

## 2022-07-10 DIAGNOSIS — R9721 Rising PSA following treatment for malignant neoplasm of prostate: Secondary | ICD-10-CM | POA: Diagnosis not present

## 2022-07-10 DIAGNOSIS — Z8546 Personal history of malignant neoplasm of prostate: Secondary | ICD-10-CM | POA: Diagnosis not present

## 2022-07-10 DIAGNOSIS — Z9079 Acquired absence of other genital organ(s): Secondary | ICD-10-CM | POA: Diagnosis not present

## 2022-07-10 DIAGNOSIS — Z23 Encounter for immunization: Secondary | ICD-10-CM | POA: Diagnosis not present

## 2022-07-10 DIAGNOSIS — Z87442 Personal history of urinary calculi: Secondary | ICD-10-CM | POA: Diagnosis not present

## 2022-07-30 DIAGNOSIS — R9721 Rising PSA following treatment for malignant neoplasm of prostate: Secondary | ICD-10-CM | POA: Diagnosis not present

## 2022-07-30 DIAGNOSIS — Z7984 Long term (current) use of oral hypoglycemic drugs: Secondary | ICD-10-CM | POA: Diagnosis not present

## 2022-07-30 DIAGNOSIS — C61 Malignant neoplasm of prostate: Secondary | ICD-10-CM | POA: Diagnosis not present

## 2022-07-30 DIAGNOSIS — Z79899 Other long term (current) drug therapy: Secondary | ICD-10-CM | POA: Diagnosis not present

## 2022-07-30 DIAGNOSIS — Z9079 Acquired absence of other genital organ(s): Secondary | ICD-10-CM | POA: Diagnosis not present

## 2022-07-30 DIAGNOSIS — Z23 Encounter for immunization: Secondary | ICD-10-CM | POA: Diagnosis not present

## 2022-09-22 DIAGNOSIS — M5387 Other specified dorsopathies, lumbosacral region: Secondary | ICD-10-CM | POA: Diagnosis not present

## 2022-09-22 DIAGNOSIS — M7918 Myalgia, other site: Secondary | ICD-10-CM | POA: Diagnosis not present

## 2022-09-22 DIAGNOSIS — M9903 Segmental and somatic dysfunction of lumbar region: Secondary | ICD-10-CM | POA: Diagnosis not present

## 2022-09-22 DIAGNOSIS — M9905 Segmental and somatic dysfunction of pelvic region: Secondary | ICD-10-CM | POA: Diagnosis not present

## 2022-09-22 DIAGNOSIS — M461 Sacroiliitis, not elsewhere classified: Secondary | ICD-10-CM | POA: Diagnosis not present

## 2022-09-22 DIAGNOSIS — M9902 Segmental and somatic dysfunction of thoracic region: Secondary | ICD-10-CM | POA: Diagnosis not present

## 2022-09-26 ENCOUNTER — Other Ambulatory Visit: Payer: Self-pay | Admitting: Internal Medicine

## 2022-09-26 ENCOUNTER — Other Ambulatory Visit: Payer: Self-pay

## 2022-10-04 ENCOUNTER — Other Ambulatory Visit: Payer: Self-pay | Admitting: Internal Medicine

## 2022-10-04 DIAGNOSIS — I10 Essential (primary) hypertension: Secondary | ICD-10-CM

## 2022-10-06 ENCOUNTER — Other Ambulatory Visit: Payer: Self-pay

## 2022-10-06 DIAGNOSIS — M5387 Other specified dorsopathies, lumbosacral region: Secondary | ICD-10-CM | POA: Diagnosis not present

## 2022-10-06 DIAGNOSIS — M461 Sacroiliitis, not elsewhere classified: Secondary | ICD-10-CM | POA: Diagnosis not present

## 2022-10-06 DIAGNOSIS — M7918 Myalgia, other site: Secondary | ICD-10-CM | POA: Diagnosis not present

## 2022-10-06 DIAGNOSIS — M9902 Segmental and somatic dysfunction of thoracic region: Secondary | ICD-10-CM | POA: Diagnosis not present

## 2022-10-06 DIAGNOSIS — M9903 Segmental and somatic dysfunction of lumbar region: Secondary | ICD-10-CM | POA: Diagnosis not present

## 2022-10-06 DIAGNOSIS — M9905 Segmental and somatic dysfunction of pelvic region: Secondary | ICD-10-CM | POA: Diagnosis not present

## 2022-10-09 DIAGNOSIS — M9903 Segmental and somatic dysfunction of lumbar region: Secondary | ICD-10-CM | POA: Diagnosis not present

## 2022-10-09 DIAGNOSIS — M9905 Segmental and somatic dysfunction of pelvic region: Secondary | ICD-10-CM | POA: Diagnosis not present

## 2022-10-09 DIAGNOSIS — M7918 Myalgia, other site: Secondary | ICD-10-CM | POA: Diagnosis not present

## 2022-10-09 DIAGNOSIS — M461 Sacroiliitis, not elsewhere classified: Secondary | ICD-10-CM | POA: Diagnosis not present

## 2022-10-09 DIAGNOSIS — M5387 Other specified dorsopathies, lumbosacral region: Secondary | ICD-10-CM | POA: Diagnosis not present

## 2022-10-09 DIAGNOSIS — M9902 Segmental and somatic dysfunction of thoracic region: Secondary | ICD-10-CM | POA: Diagnosis not present

## 2022-10-13 DIAGNOSIS — M7918 Myalgia, other site: Secondary | ICD-10-CM | POA: Diagnosis not present

## 2022-10-13 DIAGNOSIS — M9903 Segmental and somatic dysfunction of lumbar region: Secondary | ICD-10-CM | POA: Diagnosis not present

## 2022-10-13 DIAGNOSIS — M461 Sacroiliitis, not elsewhere classified: Secondary | ICD-10-CM | POA: Diagnosis not present

## 2022-10-13 DIAGNOSIS — M9905 Segmental and somatic dysfunction of pelvic region: Secondary | ICD-10-CM | POA: Diagnosis not present

## 2022-10-13 DIAGNOSIS — M9902 Segmental and somatic dysfunction of thoracic region: Secondary | ICD-10-CM | POA: Diagnosis not present

## 2022-10-13 DIAGNOSIS — M5387 Other specified dorsopathies, lumbosacral region: Secondary | ICD-10-CM | POA: Diagnosis not present

## 2022-10-16 DIAGNOSIS — M5387 Other specified dorsopathies, lumbosacral region: Secondary | ICD-10-CM | POA: Diagnosis not present

## 2022-10-16 DIAGNOSIS — M9902 Segmental and somatic dysfunction of thoracic region: Secondary | ICD-10-CM | POA: Diagnosis not present

## 2022-10-16 DIAGNOSIS — M9903 Segmental and somatic dysfunction of lumbar region: Secondary | ICD-10-CM | POA: Diagnosis not present

## 2022-10-16 DIAGNOSIS — M7918 Myalgia, other site: Secondary | ICD-10-CM | POA: Diagnosis not present

## 2022-10-16 DIAGNOSIS — M9905 Segmental and somatic dysfunction of pelvic region: Secondary | ICD-10-CM | POA: Diagnosis not present

## 2022-10-16 DIAGNOSIS — M461 Sacroiliitis, not elsewhere classified: Secondary | ICD-10-CM | POA: Diagnosis not present

## 2022-10-22 DIAGNOSIS — M9902 Segmental and somatic dysfunction of thoracic region: Secondary | ICD-10-CM | POA: Diagnosis not present

## 2022-10-22 DIAGNOSIS — M5387 Other specified dorsopathies, lumbosacral region: Secondary | ICD-10-CM | POA: Diagnosis not present

## 2022-10-22 DIAGNOSIS — M9903 Segmental and somatic dysfunction of lumbar region: Secondary | ICD-10-CM | POA: Diagnosis not present

## 2022-10-22 DIAGNOSIS — M461 Sacroiliitis, not elsewhere classified: Secondary | ICD-10-CM | POA: Diagnosis not present

## 2022-10-22 DIAGNOSIS — M9905 Segmental and somatic dysfunction of pelvic region: Secondary | ICD-10-CM | POA: Diagnosis not present

## 2022-10-22 DIAGNOSIS — M7918 Myalgia, other site: Secondary | ICD-10-CM | POA: Diagnosis not present

## 2022-10-23 ENCOUNTER — Telehealth: Payer: Self-pay | Admitting: Internal Medicine

## 2022-10-23 NOTE — Telephone Encounter (Signed)
Form placed on Dr. Quay Burow desk to sign and complete.

## 2022-10-23 NOTE — Telephone Encounter (Signed)
Patient came in and dropped off an Health Examination Form that he needs filled out. Patient stated that he would like to be called when form is complete and he will come pick it up. Form was placed in Dr. Quay Burow box at the front.  Best call back number is [phone].

## 2022-10-24 NOTE — Telephone Encounter (Signed)
Form completed and taken up front

## 2022-10-29 DIAGNOSIS — M461 Sacroiliitis, not elsewhere classified: Secondary | ICD-10-CM | POA: Diagnosis not present

## 2022-10-29 DIAGNOSIS — M9903 Segmental and somatic dysfunction of lumbar region: Secondary | ICD-10-CM | POA: Diagnosis not present

## 2022-10-29 DIAGNOSIS — M9902 Segmental and somatic dysfunction of thoracic region: Secondary | ICD-10-CM | POA: Diagnosis not present

## 2022-10-29 DIAGNOSIS — M7918 Myalgia, other site: Secondary | ICD-10-CM | POA: Diagnosis not present

## 2022-10-29 DIAGNOSIS — M9905 Segmental and somatic dysfunction of pelvic region: Secondary | ICD-10-CM | POA: Diagnosis not present

## 2022-10-29 DIAGNOSIS — M5387 Other specified dorsopathies, lumbosacral region: Secondary | ICD-10-CM | POA: Diagnosis not present

## 2022-11-12 DIAGNOSIS — M9905 Segmental and somatic dysfunction of pelvic region: Secondary | ICD-10-CM | POA: Diagnosis not present

## 2022-11-12 DIAGNOSIS — M461 Sacroiliitis, not elsewhere classified: Secondary | ICD-10-CM | POA: Diagnosis not present

## 2022-11-12 DIAGNOSIS — M9903 Segmental and somatic dysfunction of lumbar region: Secondary | ICD-10-CM | POA: Diagnosis not present

## 2022-11-12 DIAGNOSIS — M9902 Segmental and somatic dysfunction of thoracic region: Secondary | ICD-10-CM | POA: Diagnosis not present

## 2022-11-12 DIAGNOSIS — M5387 Other specified dorsopathies, lumbosacral region: Secondary | ICD-10-CM | POA: Diagnosis not present

## 2022-11-12 DIAGNOSIS — M7918 Myalgia, other site: Secondary | ICD-10-CM | POA: Diagnosis not present

## 2022-11-24 DIAGNOSIS — M9905 Segmental and somatic dysfunction of pelvic region: Secondary | ICD-10-CM | POA: Diagnosis not present

## 2022-11-24 DIAGNOSIS — M7918 Myalgia, other site: Secondary | ICD-10-CM | POA: Diagnosis not present

## 2022-11-24 DIAGNOSIS — M9903 Segmental and somatic dysfunction of lumbar region: Secondary | ICD-10-CM | POA: Diagnosis not present

## 2022-11-24 DIAGNOSIS — M461 Sacroiliitis, not elsewhere classified: Secondary | ICD-10-CM | POA: Diagnosis not present

## 2022-11-24 DIAGNOSIS — M5387 Other specified dorsopathies, lumbosacral region: Secondary | ICD-10-CM | POA: Diagnosis not present

## 2022-11-24 DIAGNOSIS — M9902 Segmental and somatic dysfunction of thoracic region: Secondary | ICD-10-CM | POA: Diagnosis not present

## 2022-12-08 DIAGNOSIS — M9905 Segmental and somatic dysfunction of pelvic region: Secondary | ICD-10-CM | POA: Diagnosis not present

## 2022-12-08 DIAGNOSIS — M461 Sacroiliitis, not elsewhere classified: Secondary | ICD-10-CM | POA: Diagnosis not present

## 2022-12-08 DIAGNOSIS — M7918 Myalgia, other site: Secondary | ICD-10-CM | POA: Diagnosis not present

## 2022-12-08 DIAGNOSIS — M9902 Segmental and somatic dysfunction of thoracic region: Secondary | ICD-10-CM | POA: Diagnosis not present

## 2022-12-08 DIAGNOSIS — M5387 Other specified dorsopathies, lumbosacral region: Secondary | ICD-10-CM | POA: Diagnosis not present

## 2022-12-08 DIAGNOSIS — M9903 Segmental and somatic dysfunction of lumbar region: Secondary | ICD-10-CM | POA: Diagnosis not present

## 2022-12-22 DIAGNOSIS — M9903 Segmental and somatic dysfunction of lumbar region: Secondary | ICD-10-CM | POA: Diagnosis not present

## 2022-12-22 DIAGNOSIS — M5387 Other specified dorsopathies, lumbosacral region: Secondary | ICD-10-CM | POA: Diagnosis not present

## 2022-12-22 DIAGNOSIS — M9905 Segmental and somatic dysfunction of pelvic region: Secondary | ICD-10-CM | POA: Diagnosis not present

## 2022-12-22 DIAGNOSIS — M9902 Segmental and somatic dysfunction of thoracic region: Secondary | ICD-10-CM | POA: Diagnosis not present

## 2022-12-22 DIAGNOSIS — M7918 Myalgia, other site: Secondary | ICD-10-CM | POA: Diagnosis not present

## 2022-12-22 DIAGNOSIS — M461 Sacroiliitis, not elsewhere classified: Secondary | ICD-10-CM | POA: Diagnosis not present

## 2022-12-25 ENCOUNTER — Encounter: Payer: Self-pay | Admitting: Internal Medicine

## 2022-12-25 NOTE — Patient Instructions (Addendum)
Consider getting a tetanus and shingles vaccine at the pharmacy.     Blood work was ordered.   The lab is on the first floor.    Medications changes include :   fungal cream for the feet and liquid for the nail.      Return in about 6 months (around 06/27/2023) for follow up.   Health Maintenance, Male Adopting a healthy lifestyle and getting preventive care are important in promoting health and wellness. Ask your health care provider about: The right schedule for you to have regular tests and exams. Things you can do on your own to prevent diseases and keep yourself healthy. What should I know about diet, weight, and exercise? Eat a healthy diet  Eat a diet that includes plenty of vegetables, fruits, low-fat dairy products, and lean protein. Do not eat a lot of foods that are high in solid fats, added sugars, or sodium. Maintain a healthy weight Body mass index (BMI) is a measurement that can be used to identify possible weight problems. It estimates body fat based on height and weight. Your health care provider can help determine your BMI and help you achieve or maintain a healthy weight. Get regular exercise Get regular exercise. This is one of the most important things you can do for your health. Most adults should: Exercise for at least 150 minutes each week. The exercise should increase your heart rate and make you sweat (moderate-intensity exercise). Do strengthening exercises at least twice a week. This is in addition to the moderate-intensity exercise. Spend less time sitting. Even light physical activity can be beneficial. Watch cholesterol and blood lipids Have your blood tested for lipids and cholesterol at 72 years of age, then have this test every 5 years. You may need to have your cholesterol levels checked more often if: Your lipid or cholesterol levels are high. You are older than 72 years of age. You are at high risk for heart disease. What should I know  about cancer screening? Many types of cancers can be detected early and may often be prevented. Depending on your health history and family history, you may need to have cancer screening at various ages. This may include screening for: Colorectal cancer. Prostate cancer. Skin cancer. Lung cancer. What should I know about heart disease, diabetes, and high blood pressure? Blood pressure and heart disease High blood pressure causes heart disease and increases the risk of stroke. This is more likely to develop in people who have high blood pressure readings or are overweight. Talk with your health care provider about your target blood pressure readings. Have your blood pressure checked: Every 3-5 years if you are 64-87 years of age. Every year if you are 40 years old or older. If you are between the ages of 92 and 58 and are a current or former smoker, ask your health care provider if you should have a one-time screening for abdominal aortic aneurysm (AAA). Diabetes Have regular diabetes screenings. This checks your fasting blood sugar level. Have the screening done: Once every three years after age 69 if you are at a normal weight and have a low risk for diabetes. More often and at a younger age if you are overweight or have a high risk for diabetes. What should I know about preventing infection? Hepatitis B If you have a higher risk for hepatitis B, you should be screened for this virus. Talk with your health care provider to find out if you are at  risk for hepatitis B infection. Hepatitis C Blood testing is recommended for: Everyone born from [birth year] through 1965. Anyone with known risk factors for hepatitis C. Sexually transmitted infections (STIs) You should be screened each year for STIs, including gonorrhea and chlamydia, if: You are sexually active and are younger than 72 years of age. You are older than 72 years of age and your health care provider tells you that you are at risk for  this type of infection. Your sexual activity has changed since you were last screened, and you are at increased risk for chlamydia or gonorrhea. Ask your health care provider if you are at risk. Ask your health care provider about whether you are at high risk for HIV. Your health care provider may recommend a prescription medicine to help prevent HIV infection. If you choose to take medicine to prevent HIV, you should first get tested for HIV. You should then be tested every 3 months for as long as you are taking the medicine. Follow these instructions at home: Alcohol use Do not drink alcohol if your health care provider tells you not to drink. If you drink alcohol: Limit how much you have to 0-2 drinks a day. Know how much alcohol is in your drink. In the [place] one drink equals one 12 oz bottle of beer (355 mL), one 5 oz glass of wine (148 mL), or one 1 oz glass of hard liquor (44 mL). Lifestyle Do not use any products that contain nicotine or tobacco. These products include cigarettes, chewing tobacco, and vaping devices, such as e-cigarettes. If you need help quitting, ask your health care provider. Do not use street drugs. Do not share needles. Ask your health care provider for help if you need support or information about quitting drugs. General instructions Schedule regular health, dental, and eye exams. Stay current with your vaccines. Tell your health care provider if: You often feel depressed. You have ever been abused or do not feel safe at home. Summary Adopting a healthy lifestyle and getting preventive care are important in promoting health and wellness. Follow your health care provider's instructions about healthy diet, exercising, and getting tested or screened for diseases. Follow your health care provider's instructions on monitoring your cholesterol and blood pressure. This information is not intended to replace advice given to you by your health care provider. Make sure  you discuss any questions you have with your health care provider. Document Revised: 01/21/2021 Document Reviewed: 01/21/2021 Elsevier Patient Education  2023 [employer].

## 2022-12-26 ENCOUNTER — Ambulatory Visit (INDEPENDENT_AMBULATORY_CARE_PROVIDER_SITE_OTHER): Payer: Medicare PPO | Admitting: Internal Medicine

## 2022-12-26 VITALS — BP 130/70 | HR 77 | Temp 98.1°F | Ht 70.0 in | Wt 216.0 lb

## 2022-12-26 DIAGNOSIS — B351 Tinea unguium: Secondary | ICD-10-CM | POA: Diagnosis not present

## 2022-12-26 DIAGNOSIS — R634 Abnormal weight loss: Secondary | ICD-10-CM

## 2022-12-26 DIAGNOSIS — K2270 Barrett's esophagus without dysplasia: Secondary | ICD-10-CM

## 2022-12-26 DIAGNOSIS — B353 Tinea pedis: Secondary | ICD-10-CM

## 2022-12-26 DIAGNOSIS — Z8546 Personal history of malignant neoplasm of prostate: Secondary | ICD-10-CM

## 2022-12-26 DIAGNOSIS — E1169 Type 2 diabetes mellitus with other specified complication: Secondary | ICD-10-CM

## 2022-12-26 DIAGNOSIS — Z125 Encounter for screening for malignant neoplasm of prostate: Secondary | ICD-10-CM

## 2022-12-26 DIAGNOSIS — E7849 Other hyperlipidemia: Secondary | ICD-10-CM

## 2022-12-26 DIAGNOSIS — I10 Essential (primary) hypertension: Secondary | ICD-10-CM | POA: Diagnosis not present

## 2022-12-26 DIAGNOSIS — Z0000 Encounter for general adult medical examination without abnormal findings: Secondary | ICD-10-CM | POA: Diagnosis not present

## 2022-12-26 LAB — COMPREHENSIVE METABOLIC PANEL
ALT: 28 U/L (ref 0–53)
AST: 19 U/L (ref 0–37)
Albumin: 4.3 g/dL (ref 3.5–5.2)
Alkaline Phosphatase: 77 U/L (ref 39–117)
BUN: 14 mg/dL (ref 6–23)
CO2: 28 mEq/L (ref 19–32)
Calcium: 9.8 mg/dL (ref 8.4–10.5)
Chloride: 98 mEq/L (ref 96–112)
Creatinine, Ser: 0.92 mg/dL (ref 0.40–1.50)
GFR: 83.55 mL/min (ref >60.00)
Glucose, Bld: 194 mg/dL — ABNORMAL HIGH (ref 70–99)
Potassium: 5 mEq/L (ref 3.5–5.1)
Sodium: 136 mEq/L (ref 135–145)
Total Bilirubin: 0.7 mg/dL (ref 0.2–1.2)
Total Protein: 7.2 g/dL (ref 6.0–8.3)

## 2022-12-26 LAB — CBC WITH DIFFERENTIAL/PLATELET
Basophils Absolute: 0 10*3/uL (ref 0.0–0.1)
Basophils Relative: 0.1 % (ref 0.0–3.0)
Eosinophils Absolute: 0.1 10*3/uL (ref 0.0–0.7)
Eosinophils Relative: 1.1 % (ref 0.0–5.0)
HCT: 47.1 % (ref 39.0–52.0)
Hemoglobin: 15.9 g/dL (ref 13.0–17.0)
Lymphocytes Relative: 15.6 % (ref 12.0–46.0)
Lymphs Abs: 1.3 10*3/uL (ref 0.7–4.0)
MCHC: 33.7 g/dL (ref 30.0–36.0)
MCV: 80.4 fl (ref 78.0–100.0)
Monocytes Absolute: 0.8 10*3/uL (ref 0.1–1.0)
Monocytes Relative: 9.5 % (ref 3.0–12.0)
Neutro Abs: 6 10*3/uL (ref 1.4–7.7)
Neutrophils Relative %: 73.7 % (ref 43.0–77.0)
Platelets: 282 10*3/uL (ref 150.0–400.0)
RBC: 5.86 Mil/uL — ABNORMAL HIGH (ref 4.22–5.81)
RDW: 16.1 % — ABNORMAL HIGH (ref 11.5–15.5)
WBC: 8.1 10*3/uL (ref 4.0–10.5)

## 2022-12-26 LAB — LIPID PANEL
Cholesterol: 129 mg/dL (ref 0–200)
HDL: 45.4 mg/dL (ref >39.00)
LDL Cholesterol: 52 mg/dL (ref 0–99)
NonHDL: 83.15
Total CHOL/HDL Ratio: 3
Triglycerides: 156 mg/dL — ABNORMAL HIGH (ref 0.0–149.0)
VLDL: 31.2 mg/dL (ref 0.0–40.0)

## 2022-12-26 LAB — HEMOGLOBIN A1C: Hgb A1c MFr Bld: 8.7 % — ABNORMAL HIGH (ref 4.6–6.5)

## 2022-12-26 LAB — MICROALBUMIN / CREATININE URINE RATIO
Creatinine,U: 113.6 mg/dL
Microalb Creat Ratio: 1 mg/g (ref 0.0–30.0)
Microalb, Ur: 1.1 mg/dL (ref 0.0–1.9)

## 2022-12-26 LAB — PSA, MEDICARE: PSA: 1.13 ng/ml (ref 0.10–4.00)

## 2022-12-26 LAB — TSH: TSH: 1.33 u[IU]/mL (ref 0.35–5.50)

## 2022-12-26 MED ORDER — HYDROCHLOROTHIAZIDE 25 MG PO TABS: 25.0000 mg | ORAL_TABLET | Freq: Every day | ORAL | 1 refills | Status: DC

## 2022-12-26 MED ORDER — METFORMIN HCL ER 500 MG PO TB24: 1500.0000 mg | ORAL_TABLET | Freq: Every day | ORAL | 3 refills | Status: DC

## 2022-12-26 MED ORDER — ROSUVASTATIN CALCIUM 5 MG PO TABS: 5.0000 mg | ORAL_TABLET | Freq: Every day | ORAL | 1 refills | Status: DC

## 2022-12-26 MED ORDER — LOSARTAN POTASSIUM 100 MG PO TABS: 100.0000 mg | ORAL_TABLET | Freq: Every day | ORAL | 1 refills | Status: DC

## 2022-12-26 MED ORDER — JUBLIA 10 % EX SOLN: CUTANEOUS | 2 refills | Status: DC

## 2022-12-26 MED ORDER — KETOCONAZOLE 2 % EX CREA: 1.0000 | TOPICAL_CREAM | Freq: Every day | CUTANEOUS | 1 refills | Status: DC

## 2022-12-26 NOTE — Assessment & Plan Note (Signed)
Chronic With hyperlipidemia  Lab Results  Component Value Date   HGBA1C 7.1 (H) 06/26/2022   Sugars not ideally controlled He does not check his sugars Check A1c Continue metformin 1500 mg xr daily Stressed regular exercise, diabetic diet

## 2022-12-26 NOTE — Assessment & Plan Note (Signed)
Chronic Check lipid panel  Continue crestor 5 mg daily Regular exercise and healthy diet encouraged   Lab Results  Component Value Date   LDLCALC 53 02/24/2022

## 2022-12-26 NOTE — Assessment & Plan Note (Signed)
Chronic H/o Barrett's esophagus Continue pantoprazole 40 mg daily Up-to-date with EGD per patient Denies GERD, dysphagia Follows with GI yearly

## 2022-12-26 NOTE — Assessment & Plan Note (Signed)
New Mild Will treat with ketoconazole 2% cream Also treating onychomycosis

## 2022-12-26 NOTE — Progress Notes (Signed)
Subjective:    Patient ID: Johnny [name], male    DOB: 1950/12/17, 72 y.o.   MRN: [id number]     HPI [name] is here for a physical exam and his chronic medical problems.   Nothing new.  3 toe nails look like they may have a fungus.  The skin has been dry.  Three toes on right foot and one toe on left.  Has tried otc fungal sprays and ointments.   Occ has pain at night in his right big toe.  Rarely has it during the day.  Usually occurs when he is in bed.  Sharp pain that lasts 1-2 seconds.     Medications and allergies reviewed with patient and updated if appropriate.  Current Outpatient Medications on File Prior to Visit  Medication Sig Dispense Refill   b complex vitamins tablet Take 1 tablet by mouth daily.     Cholecalciferol (VITAMIN D3) 50 MCG (2000 UT) capsule Take by mouth.     hydrochlorothiazide (HYDRODIURIL) 25 MG tablet TAKE 1 TABLET BY MOUTH EVERY DAY 90 tablet 1   losartan (COZAAR) 100 MG tablet TAKE 1 TABLET BY MOUTH EVERY DAY 90 tablet 1   MegaRed Omega-3 Krill Oil 500 MG CAPS as directed Orally     metFORMIN (GLUCOPHAGE-XR) 500 MG 24 hr tablet TAKE 3 TABLETS (1,500 MG TOTAL) BY MOUTH DAILY WITH BREAKFAST. 270 tablet 3   metroNIDAZOLE (METROCREAM) 0.75 % cream      pantoprazole (PROTONIX) 40 MG tablet Take 1 tablet (40 mg total) by mouth daily. 18090 tablet 1   rosuvastatin (CRESTOR) 5 MG tablet TAKE 1 TABLET BY MOUTH EVERY DAY 90 tablet 1   Sulfacetamide Sodium-Sulfur 9.8-4.8 % LIQD      Omega-3 Fatty Acids (OMEGA 3 PO) Take 1 tablet by mouth daily.  (Patient not taking: Reported on 12/26/2022)     No current facility-administered medications on file prior to visit.    Review of Systems  Constitutional:  Negative for fever.  HENT:  Negative for trouble swallowing.   Eyes:  Negative for visual disturbance.  Respiratory:  Negative for cough, shortness of breath and wheezing.   Cardiovascular:  Positive for palpitations (occ). Negative for chest pain and leg  swelling.  Gastrointestinal:  Negative for abdominal pain, blood in stool, constipation and diarrhea.       No gerd  Genitourinary:  Negative for difficulty urinating and dysuria.  Musculoskeletal:  Positive for arthralgias (right hand). Negative for back pain.  Skin:  Negative for rash.  Neurological:  Negative for light-headedness, numbness (tingling occ in feet) and headaches.  Psychiatric/Behavioral:  Negative for dysphoric mood. The patient is not nervous/anxious.        Objective:   Vitals:   12/26/22 0814  BP: 130/70  Pulse: 77  Temp: 98.1 F (36.7 C)  SpO2: 96%   Filed Weights   12/26/22 0814  Weight: 216 lb (98 kg)   Body mass index is 30.99 kg/m.  BP Readings from Last 3 Encounters:  12/26/22 130/70  06/26/22 120/80  06/19/22 122/82    Wt Readings from Last 3 Encounters:  12/26/22 216 lb (98 kg)  06/26/22 210 lb (95.3 kg)  06/19/22 208 lb (94.3 kg)      Physical Exam Constitutional: He appears well-developed and well-nourished. No distress.  HENT:  Head: Normocephalic and atraumatic.  Right Ear: External ear normal.  Left Ear: External ear normal.  Normal ear canals and TM b/l  Mouth/Throat: Oropharynx is clear  and moist. Eyes: Conjunctivae and EOM are normal.  Neck: Neck supple. No tracheal deviation present. No thyromegaly present.  No carotid bruit  Cardiovascular: Normal rate, regular rhythm, normal heart sounds and intact distal pulses.   No murmur heard.  No lower extremity edema. Pulmonary/Chest: Effort normal and breath sounds normal. No respiratory distress. He has no wheezes. He has no rales.  Abdominal: Soft. He exhibits no distension. There is no tenderness.  Genitourinary: deferred  Lymphadenopathy:   He has no cervical adenopathy.  Skin: Skin is warm and dry. He is not diaphoretic.  Psychiatric: He has a normal mood and affect. His behavior is normal.    Diabetic Foot Exam - Simple   Simple Foot Form Diabetic Foot exam was  performed with the following findings: Yes 12/26/2022  8:59 AM  Visual Inspection No deformities, no ulcerations, no other skin breakdown bilaterally: Yes See comments: Yes Sensation Testing Intact to touch and monofilament testing bilaterally: Yes Pulse Check Posterior Tibialis and Dorsalis pulse intact bilaterally: Yes Comments Right first 4 toenail and left first toenail with onychomycosis - yellowing of nail, minimal dry skin at toes - skin normal between normal         Assessment & Plan:   Physical exam: Screening blood work  ordered Exercise  plays golf, some walking Weight encouraged weight loss Substance abuse   none   Reviewed recommended immunizations.   Health Maintenance  Topic Date Due   Zoster Vaccines- Shingrix (1 of 2) Never done   FOOT EXAM  03/25/2018   OPHTHALMOLOGY EXAM  03/26/2019   DTaP/Tdap/Td (2 - Td or Tdap) 02/02/2022   HEMOGLOBIN A1C  12/26/2022   Medicare Annual Wellness (AWV)  02/18/2023   COVID-19 Vaccine (1) 01/11/2023 (Originally 03/28/1956)   Diabetic kidney evaluation - Urine ACR  02/25/2023   INFLUENZA VACCINE  04/16/2023   Diabetic kidney evaluation - eGFR measurement  06/27/2023   COLONOSCOPY (Pts 45-73yrs Insurance coverage will need to be confirmed)  01/10/2026   Pneumonia Vaccine 41+ Years old  Completed   Hepatitis C Screening  Completed   HPV VACCINES  Aged Out     See Problem List for Assessment and Plan of chronic medical problems.

## 2022-12-26 NOTE — Assessment & Plan Note (Signed)
Chronic BP well controlled Continue hctz 25 mg daily, losartan 100 mg daily cmp  

## 2022-12-26 NOTE — Assessment & Plan Note (Signed)
New 4 toes on the right foot, 1 toenail on the left foot.  Symptoms of onychomycosis He has tried over-the-counter topical medication-ciclopirox and tried to treat his tinea pedis with over-the-counter medication as well without improvement Start Jublia daily x 48 weeks Will treat skin of feet with ketoconazole 2% cream once daily

## 2023-01-12 DIAGNOSIS — M461 Sacroiliitis, not elsewhere classified: Secondary | ICD-10-CM | POA: Diagnosis not present

## 2023-01-12 DIAGNOSIS — M9902 Segmental and somatic dysfunction of thoracic region: Secondary | ICD-10-CM | POA: Diagnosis not present

## 2023-01-12 DIAGNOSIS — M9903 Segmental and somatic dysfunction of lumbar region: Secondary | ICD-10-CM | POA: Diagnosis not present

## 2023-01-12 DIAGNOSIS — M9905 Segmental and somatic dysfunction of pelvic region: Secondary | ICD-10-CM | POA: Diagnosis not present

## 2023-01-12 DIAGNOSIS — M7918 Myalgia, other site: Secondary | ICD-10-CM | POA: Diagnosis not present

## 2023-01-12 DIAGNOSIS — M5387 Other specified dorsopathies, lumbosacral region: Secondary | ICD-10-CM | POA: Diagnosis not present

## 2023-01-20 DIAGNOSIS — H2513 Age-related nuclear cataract, bilateral: Secondary | ICD-10-CM | POA: Diagnosis not present

## 2023-01-20 DIAGNOSIS — E119 Type 2 diabetes mellitus without complications: Secondary | ICD-10-CM | POA: Diagnosis not present

## 2023-01-20 LAB — HM DIABETES EYE EXAM

## 2023-01-22 ENCOUNTER — Telehealth: Payer: Self-pay

## 2023-01-22 NOTE — Telephone Encounter (Signed)
Patient Advocate Encounter  Received a fax from [facility] regarding Prior Authorization for Jublia 10% solution.   Key: [id number]  Authorization has been DENIED due to    Determination letter attached to patient chart

## 2023-02-10 DIAGNOSIS — M9905 Segmental and somatic dysfunction of pelvic region: Secondary | ICD-10-CM | POA: Diagnosis not present

## 2023-02-10 DIAGNOSIS — M7918 Myalgia, other site: Secondary | ICD-10-CM | POA: Diagnosis not present

## 2023-02-10 DIAGNOSIS — M9903 Segmental and somatic dysfunction of lumbar region: Secondary | ICD-10-CM | POA: Diagnosis not present

## 2023-02-10 DIAGNOSIS — M461 Sacroiliitis, not elsewhere classified: Secondary | ICD-10-CM | POA: Diagnosis not present

## 2023-02-10 DIAGNOSIS — M5387 Other specified dorsopathies, lumbosacral region: Secondary | ICD-10-CM | POA: Diagnosis not present

## 2023-02-10 DIAGNOSIS — M9902 Segmental and somatic dysfunction of thoracic region: Secondary | ICD-10-CM | POA: Diagnosis not present

## 2023-02-12 DIAGNOSIS — L570 Actinic keratosis: Secondary | ICD-10-CM | POA: Diagnosis not present

## 2023-02-12 DIAGNOSIS — D485 Neoplasm of uncertain behavior of skin: Secondary | ICD-10-CM | POA: Diagnosis not present

## 2023-02-20 ENCOUNTER — Ambulatory Visit (INDEPENDENT_AMBULATORY_CARE_PROVIDER_SITE_OTHER): Payer: Medicare PPO | Admitting: *Deleted

## 2023-02-20 VITALS — Ht 70.0 in | Wt 205.0 lb

## 2023-02-20 DIAGNOSIS — Z0000 Encounter for general adult medical examination without abnormal findings: Secondary | ICD-10-CM | POA: Diagnosis not present

## 2023-02-20 NOTE — Patient Instructions (Signed)
Health Maintenance, Male Adopting a healthy lifestyle and getting preventive care are important in promoting health and wellness. Ask your health care provider about: The right schedule for you to have regular tests and exams. Things you can do on your own to prevent diseases and keep yourself healthy. What should I know about diet, weight, and exercise? Eat a healthy diet  Eat a diet that includes plenty of vegetables, fruits, low-fat dairy products, and lean protein. Do not eat a lot of foods that are high in solid fats, added sugars, or sodium. Maintain a healthy weight Body mass index (BMI) is a measurement that can be used to identify possible weight problems. It estimates body fat based on height and weight. Your health care provider can help determine your BMI and help you achieve or maintain a healthy weight. Get regular exercise Get regular exercise. This is one of the most important things you can do for your health. Most adults should: Exercise for at least 150 minutes each week. The exercise should increase your heart rate and make you sweat (moderate-intensity exercise). Do strengthening exercises at least twice a week. This is in addition to the moderate-intensity exercise. Spend less time sitting. Even light physical activity can be beneficial. Watch cholesterol and blood lipids Have your blood tested for lipids and cholesterol at 72 years of age, then have this test every 5 years. You may need to have your cholesterol levels checked more often if: Your lipid or cholesterol levels are high. You are older than 72 years of age. You are at high risk for heart disease. What should I know about cancer screening? Many types of cancers can be detected early and may often be prevented. Depending on your health history and family history, you may need to have cancer screening at various ages. This may include screening for: Colorectal cancer. Prostate cancer. Skin cancer. Lung  cancer. What should I know about heart disease, diabetes, and high blood pressure? Blood pressure and heart disease High blood pressure causes heart disease and increases the risk of stroke. This is more likely to develop in people who have high blood pressure readings or are overweight. Talk with your health care provider about your target blood pressure readings. Have your blood pressure checked: Every 3-5 years if you are 18-39 years of age. Every year if you are 40 years old or older. If you are between the ages of 65 and 75 and are a current or former smoker, ask your health care provider if you should have a one-time screening for abdominal aortic aneurysm (AAA). Diabetes Have regular diabetes screenings. This checks your fasting blood sugar level. Have the screening done: Once every three years after age 45 if you are at a normal weight and have a low risk for diabetes. More often and at a younger age if you are overweight or have a high risk for diabetes. What should I know about preventing infection? Hepatitis B If you have a higher risk for hepatitis B, you should be screened for this virus. Talk with your health care provider to find out if you are at risk for hepatitis B infection. Hepatitis C Blood testing is recommended for: Everyone born from 1945 through 1965. Anyone with known risk factors for hepatitis C. Sexually transmitted infections (STIs) You should be screened each year for STIs, including gonorrhea and chlamydia, if: You are sexually active and are younger than 72 years of age. You are older than 72 years of age and your   health care provider tells you that you are at risk for this type of infection. Your sexual activity has changed since you were last screened, and you are at increased risk for chlamydia or gonorrhea. Ask your health care provider if you are at risk. Ask your health care provider about whether you are at high risk for HIV. Your health care provider  may recommend a prescription medicine to help prevent HIV infection. If you choose to take medicine to prevent HIV, you should first get tested for HIV. You should then be tested every 3 months for as long as you are taking the medicine. Follow these instructions at home: Alcohol use Do not drink alcohol if your health care provider tells you not to drink. If you drink alcohol: Limit how much you have to 0-2 drinks a day. Know how much alcohol is in your drink. In the [place] one drink equals one 12 oz bottle of beer (355 mL), one 5 oz glass of wine (148 mL), or one 1 oz glass of hard liquor (44 mL). Lifestyle Do not use any products that contain nicotine or tobacco. These products include cigarettes, chewing tobacco, and vaping devices, such as e-cigarettes. If you need help quitting, ask your health care provider. Do not use street drugs. Do not share needles. Ask your health care provider for help if you need support or information about quitting drugs. General instructions Schedule regular health, dental, and eye exams. Stay current with your vaccines. Tell your health care provider if: You often feel depressed. You have ever been abused or do not feel safe at home. Summary Adopting a healthy lifestyle and getting preventive care are important in promoting health and wellness. Follow your health care provider's instructions about healthy diet, exercising, and getting tested or screened for diseases. Follow your health care provider's instructions on monitoring your cholesterol and blood pressure. This information is not intended to replace advice given to you by your health care provider. Make sure you discuss any questions you have with your health care provider. Document Revised: 01/21/2021 Document Reviewed: 01/21/2021 [employer]vier Patient Education  2024 [employer]  

## 2023-02-20 NOTE — Progress Notes (Cosign Needed Addendum)
Subjective:   Johnny [name] is a 72 y.o. male who presents for Medicare Annual/Subsequent preventive examination.  I connected with  Johnny [name] on 02/20/23 by a audio enabled telemedicine application and verified that I am speaking with the correct person using two identifiers.  Patient Location: Home  Provider Location: Office/Clinic  I discussed the limitations of evaluation and management by telemedicine. The patient expressed understanding and agreed to proceed.   Review of Systems    Defer to PCP  Cardiac Risk Factors include: advanced age (>64men, >51 women);hypertension;diabetes mellitus;male gender    Please see problem list for additional risk factors Objective:    Today's Vitals   02/20/23 0815  Weight: 205 lb (93 kg)  Height: 5\' 10"  (1.778 m)   Body mass index is 29.41 kg/m.     02/20/2023    8:17 AM 02/17/2022    9:24 AM 10/29/2015   12:00 AM 10/28/2015    4:25 PM  Advanced Directives  Does Patient Have a Medical Advance Directive? Yes No Yes No  Type of Advance Directive Living will;Healthcare Power of Attorney  Living will   Does patient want to make changes to medical advance directive?   No - Patient declined   Copy of Healthcare Power of Attorney in Chart?   Yes   Would patient like information on creating a medical advance directive?  No - Patient declined No - patient declined information No - patient declined information    Current Medications (verified) Outpatient Encounter Medications as of 02/20/2023  Medication Sig   b complex vitamins tablet Take 1 tablet by mouth daily.   Cholecalciferol (VITAMIN D3) 50 MCG (2000 UT) capsule Take by mouth.   Efinaconazole (JUBLIA) 10 % SOLN Apply daily to affected nails for 48 weeks   hydrochlorothiazide (HYDRODIURIL) 25 MG tablet Take 1 tablet (25 mg total) by mouth daily.   ketoconazole (NIZORAL) 2 % cream Apply 1 Application topically daily.   losartan (COZAAR) 100 MG tablet Take 1 tablet (100 mg total)  by mouth daily.   MegaRed Omega-3 Krill Oil 500 MG CAPS as directed Orally   metFORMIN (GLUCOPHAGE-XR) 500 MG 24 hr tablet Take 3 tablets (1,500 mg total) by mouth daily with breakfast.   metroNIDAZOLE (METROCREAM) 0.75 % cream    pantoprazole (PROTONIX) 40 MG tablet Take 1 tablet (40 mg total) by mouth daily.   rosuvastatin (CRESTOR) 5 MG tablet Take 1 tablet (5 mg total) by mouth daily.   Sulfacetamide Sodium-Sulfur 9.8-4.8 % LIQD    No facility-administered encounter medications on file as of 02/20/2023.    Allergies (verified) Patient has no known allergies.   History: Past Medical History:  Diagnosis Date   Hyperlipidemia    LDL goal = < 110   Hypertension    Prostate cancer [facility]) 2008   Dr Carin Primrose, [facility]   Past Surgical History:  Procedure Laterality Date   CERVICAL FUSION  2007   C5-C7 , Dr Wynetta Emery    COLONOSCOPY  2006   negative; [place],    ESOPHAGOGASTRODUODENOSCOPY Left 10/28/2015   Procedure: ESOPHAGOGASTRODUODENOSCOPY (EGD);  Surgeon: Dorena Cookey, MD;  Location: [facility];  Service: Endoscopy;  Laterality: Left;   PROSTATECTOMY  11/2007   Dr.Moul, [facility]   WRIST SURGERY  1998   Hammate bone resection 2006   Family History  Problem Relation Age of Onset   Breast cancer Mother    Alzheimer's disease Mother    Colon cancer Paternal Grandmother    Diabetes Paternal Grandfather  Heart attack Paternal Grandfather 66   Breast cancer Maternal Grandmother    Heart attack Maternal Grandfather 72   Heart disease Father        Atrial fibrillation   Heart failure Father    Stroke Neg Hx    Social History   Socioeconomic History   Marital status: Married    Spouse name: Not on file   Number of children: 3   Years of education: Not on file   Highest education level: Not on file  Occupational History   Occupation: Teacher/Coach  Tobacco Use   Smoking status: Never   Smokeless tobacco: Never  Substance and Sexual Activity   Alcohol use: No   Drug use: No    Sexual activity: Not on file  Other Topics Concern   Not on file  Social History Narrative   Not on file   Social Determinants of Health   Financial Resource Strain: Low Risk  (02/20/2023)   Overall Financial Resource Strain (CARDIA)    Difficulty of Paying Living Expenses: Not hard at all  Food Insecurity: No Food Insecurity (02/20/2023)   Hunger Vital Sign    Worried About Running Out of Food in the Last Year: Never true    Ran Out of Food in the Last Year: Never true  Transportation Needs: No Transportation Needs (02/20/2023)   PRAPARE - Administrator, Civil Service (Medical): No    Lack of Transportation (Non-Medical): No  Physical Activity: Sufficiently Active (02/20/2023)   Exercise Vital Sign    Days of Exercise per Week: 4 days    Minutes of Exercise per Session: 60 min  Stress: No Stress Concern Present (02/20/2023)   [employer] of Occupational Health - Occupational Stress Questionnaire    Feeling of Stress : Not at all  Social Connections: Socially Integrated (02/20/2023)   Social Connection and Isolation Panel [NHANES]    Frequency of Communication with Friends and Family: More than three times a week    Frequency of Social Gatherings with Friends and Family: More than three times a week    Attends Religious Services: More than 4 times per year    Active Member of [employer] or Organizations: Yes    Attends Banker Meetings: Never    Marital Status: Married    Tobacco Counseling Counseling given: Not Answered   Clinical Intake:  Pre-visit preparation completed: Yes  Pain : No/denies pain     Nutritional Status: BMI 25 -29 Overweight Nutritional Risks: None Diabetes: Yes CBG done?: No  How often do you need to have someone help you when you read instructions, pamphlets, or other written materials from your doctor or pharmacy?: 1 - Never What is the last grade level you completed in school?: 4 years college  Diabetic?Nutrition Risk  Assessment:  Has the patient had any N/V/D within the last 2 months?  No  Does the patient have any non-healing wounds?  No  Has the patient had any unintentional weight loss or weight gain?  No   Diabetes:  Is the patient diabetic?  Yes  If diabetic, was a CBG obtained today?  No  Did the patient bring in their glucometer from home?  No  How often do you monitor your CBG's? Patient does not check CBGs at home .   Financial Strains and Diabetes Management:  Are you having any financial strains with the device, your supplies or your medication? No .  Does the patient want to be seen by  Chronic Care Management for management of their diabetes?  No  Would the patient like to be referred to a Nutritionist or for Diabetic Management?  No   Diabetic Exams:  Diabetic Eye Exam: Completed 01/20/2023 Diabetic Foot Exam: Completed 12/26/2022    Interpreter Needed?: No      Activities of Daily Living    02/20/2023    8:26 AM 02/20/2023    8:23 AM  In your present state of health, do you have any difficulty performing the following activities:  Hearing?  0  Vision?  1  Comment  patient wears reading glasses  Difficulty concentrating or making decisions?  0  Walking or climbing stairs?  0  Dressing or bathing?  0  Doing errands, shopping?  0  Preparing Food and eating ? N   Using the Toilet? N   In the past six months, have you accidently leaked urine? N   Do you have problems with loss of bowel control? N   Managing your Medications? N   Managing your Finances? N   Housekeeping or managing your Housekeeping? N     Patient Care Team: Pincus Sanes, MD as PCP - General (Internal Medicine)  Indicate any recent Medical Services you may have received from other than [facility] providers in the past year (date may be approximate).     Assessment:   This is a routine wellness examination for [employer].  Hearing/Vision screen Vision Screening - Comments:: Annual eye exam   Dietary issues  and exercise activities discussed: Current Exercise Habits: Home exercise routine, Type of exercise: walking, Time (Minutes): 60, Frequency (Times/Week): 4, Weekly Exercise (Minutes/Week): 240, Intensity: Moderate   Goals Addressed   None   Depression Screen    02/20/2023    8:17 AM 12/26/2022    8:20 AM 02/24/2022    9:53 AM 02/17/2022    9:23 AM 11/12/2020    8:08 AM 11/10/2019    8:37 AM 06/26/2017   12:22 PM  PHQ 2/9 Scores  PHQ - 2 Score 0 0 0 0 0 0 0  PHQ- 9 Score  0 0 0       Fall Risk    02/20/2023    8:17 AM 12/26/2022    8:20 AM 02/24/2022    9:52 AM 02/17/2022    9:25 AM 11/12/2020    8:05 AM  Fall Risk   Falls in the past year? 0 0 0 0 0  Number falls in past yr: 0 0 0 0 0  Injury with Fall? 0 0 0 0 0  Risk for fall due to : No Fall Risks No Fall Risks No Fall Risks No Fall Risks   Follow up Falls evaluation completed Falls evaluation completed Falls evaluation completed Falls evaluation completed Falls evaluation completed    FALL RISK PREVENTION PERTAINING TO THE HOME:  Any stairs in or around the home? Yes  If so, are there any without handrails? Yes  Home free of loose throw rugs in walkways, pet beds, electrical cords, etc? Yes  Adequate lighting in your home to reduce risk of falls? Yes   ASSISTIVE DEVICES UTILIZED TO PREVENT FALLS:  Life alert? No  Use of a cane, walker or w/c? No  Grab bars in the bathroom? Yes  Shower chair or bench in shower? Yes  Elevated toilet seat or a handicapped toilet? No   TIMED UP AND GO:  Was the test performed? No .  Length of time to ambulate 10 feet: n/a sec.  Cognitive Function:        02/20/2023    8:28 AM 02/17/2022    9:29 AM  6CIT Screen  What Year? 0 points 0 points  What month? 0 points 0 points  What time? 0 points 0 points  Count back from 20 0 points 0 points  Months in reverse 0 points 0 points  Repeat phrase 0 points 0 points  Total Score 0 points 0 points    Immunizations Immunization History   Administered Date(s) Administered   Influenza, High Dose Seasonal PF 06/20/2016, 06/26/2017, 06/18/2018   Pneumococcal Conjugate-13 09/25/2016   Pneumococcal Polysaccharide-23 12/25/2017   Tdap 02/03/2012    TDAP status: Due, Education has been provided regarding the importance of this vaccine. Advised may receive this vaccine at local pharmacy or Health Dept. Aware to provide a copy of the vaccination record if obtained from local pharmacy or Health Dept. Verbalized acceptance and understanding.  Flu Vaccine status: Up to date  Pneumococcal vaccine status: Up to date  Covid-19 vaccine status: Completed vaccines  Qualifies for Shingles Vaccine? Yes   Zostavax completed No   Shingrix Completed?: No.    Education has been provided regarding the importance of this vaccine. Patient has been advised to call insurance company to determine out of pocket expense if they have not yet received this vaccine. Advised may also receive vaccine at local pharmacy or Health Dept. Verbalized acceptance and understanding.  Screening Tests Health Maintenance  Topic Date Due   Zoster Vaccines- Shingrix (1 of 2) Never done   DTaP/Tdap/Td (2 - Td or Tdap) 02/02/2022   INFLUENZA VACCINE  04/16/2023   HEMOGLOBIN A1C  06/27/2023   Diabetic kidney evaluation - eGFR measurement  12/26/2023   Diabetic kidney evaluation - Urine ACR  12/26/2023   FOOT EXAM  12/26/2023   OPHTHALMOLOGY EXAM  01/20/2024   Medicare Annual Wellness (AWV)  02/20/2024   Colonoscopy  01/10/2026   Pneumonia Vaccine 83+ Years old  Completed   Hepatitis C Screening  Completed   HPV VACCINES  Aged Out   COVID-19 Vaccine  Discontinued    Health Maintenance  Health Maintenance Due  Topic Date Due   Zoster Vaccines- Shingrix (1 of 2) Never done   DTaP/Tdap/Td (2 - Td or Tdap) 02/02/2022    Colorectal cancer screening: Type of screening: Colonoscopy. Completed 01/11/2016. Repeat every 10 years  Lung Cancer Screening: (Low Dose CT  Chest recommended if Age 38-80 years, 30 pack-year currently smoking OR have quit w/in 15years.) does not qualify.   Lung Cancer Screening Referral: n/a  Additional Screening:  Hepatitis C Screening: does qualify; Completed 06/20/2016  Vision Screening: Recommended annual ophthalmology exams for early detection of glaucoma and other disorders of the eye. Is the patient up to date with their annual eye exam?  Yes  Who is the provider or what is the name of the office in which the patient attends annual eye exams? Dr. Alinda Money Crinf[place]  If pt is not established with a provider, would they like to be referred to a provider to establish care? No .   Dental Screening: Recommended annual dental exams for proper oral hygiene  Community Resource Referral / Chronic Care Management: CRR required this visit?  No   CCM required this visit?  No      Plan:     I have personally reviewed and noted the following in the patient's chart:   Medical and social history Use of alcohol, tobacco or illicit drugs  Current  medications and supplements including opioid prescriptions. Patient is not currently taking opioid prescriptions. Functional ability and status Nutritional status Physical activity Advanced directives List of other physicians Hospitalizations, surgeries, and ER visits in previous 12 months Vitals Screenings to include cognitive, depression, and falls Referrals and appointments  In addition, I have reviewed and discussed with patient certain preventive protocols, quality metrics, and best practice recommendations. A written personalized care plan for preventive services as well as general preventive health recommendations were provided to patient.     Tamela Oddi, CMA   02/20/2023  Non 30 minutes face to face   Nurse Notes:  Mr. Malvin , Thank you for taking time to come for your Medicare Wellness Visit. I appreciate your ongoing commitment to your health goals. Please  review the following plan we discussed and let me know if I can assist you in the future.   These are the goals we discussed:  Goals      DIET - EAT MORE FRUITS AND VEGETABLES        This is a list of the screening recommended for you and due dates:  Health Maintenance  Topic Date Due   Zoster (Shingles) Vaccine (1 of 2) Never done   DTaP/Tdap/Td vaccine (2 - Td or Tdap) 02/02/2022   Flu Shot  04/16/2023   Hemoglobin A1C  06/27/2023   Yearly kidney function blood test for diabetes  12/26/2023   Yearly kidney health urinalysis for diabetes  12/26/2023   Complete foot exam   12/26/2023   Eye exam for diabetics  01/20/2024   Medicare Annual Wellness Visit  02/20/2024   Colon Cancer Screening  01/10/2026   Pneumonia Vaccine  Completed   Hepatitis C Screening  Completed   HPV Vaccine  Aged Out   COVID-19 Vaccine  Discontinued     Medical screening examination/treatment/procedure(s) were performed by non-physician practitioner and as supervising physician I was immediately available for consultation/collaboration.  I agree with above. Jacinta Shoe, MD

## 2023-03-10 DIAGNOSIS — M5387 Other specified dorsopathies, lumbosacral region: Secondary | ICD-10-CM | POA: Diagnosis not present

## 2023-03-10 DIAGNOSIS — M7918 Myalgia, other site: Secondary | ICD-10-CM | POA: Diagnosis not present

## 2023-03-10 DIAGNOSIS — M461 Sacroiliitis, not elsewhere classified: Secondary | ICD-10-CM | POA: Diagnosis not present

## 2023-03-10 DIAGNOSIS — M9905 Segmental and somatic dysfunction of pelvic region: Secondary | ICD-10-CM | POA: Diagnosis not present

## 2023-03-10 DIAGNOSIS — M9903 Segmental and somatic dysfunction of lumbar region: Secondary | ICD-10-CM | POA: Diagnosis not present

## 2023-03-10 DIAGNOSIS — M9902 Segmental and somatic dysfunction of thoracic region: Secondary | ICD-10-CM | POA: Diagnosis not present

## 2023-04-07 DIAGNOSIS — M9905 Segmental and somatic dysfunction of pelvic region: Secondary | ICD-10-CM | POA: Diagnosis not present

## 2023-04-07 DIAGNOSIS — M9903 Segmental and somatic dysfunction of lumbar region: Secondary | ICD-10-CM | POA: Diagnosis not present

## 2023-04-07 DIAGNOSIS — M9902 Segmental and somatic dysfunction of thoracic region: Secondary | ICD-10-CM | POA: Diagnosis not present

## 2023-04-15 DIAGNOSIS — C61 Malignant neoplasm of prostate: Secondary | ICD-10-CM | POA: Diagnosis not present

## 2023-04-15 DIAGNOSIS — N5203 Combined arterial insufficiency and corporo-venous occlusive erectile dysfunction: Secondary | ICD-10-CM | POA: Diagnosis not present

## 2023-05-05 DIAGNOSIS — M9905 Segmental and somatic dysfunction of pelvic region: Secondary | ICD-10-CM | POA: Diagnosis not present

## 2023-05-05 DIAGNOSIS — M9903 Segmental and somatic dysfunction of lumbar region: Secondary | ICD-10-CM | POA: Diagnosis not present

## 2023-05-05 DIAGNOSIS — M9902 Segmental and somatic dysfunction of thoracic region: Secondary | ICD-10-CM | POA: Diagnosis not present

## 2023-05-21 NOTE — Progress Notes (Signed)
[facility] [address] [place] [address] Phone: [phone] Subjective:   Bruce Donath, am serving as a scribe for Dr. Antoine Primas.  I'm seeing this patient by the request  of:  Pincus Sanes, MD  CC: thumb pain   [id number]  06/19/2022 Chronic problem with worsening symptoms. Made over 3 years since the last injection. Encourage patient to continue with potential bracing, icing and topical anti-inflammatories. Chronic problem with exacerbation. Follow-up again in 6 to 8 weeks to make sure patient is doing better for further evaluation.   Update 05/22/2023 ABDOUL BLACKMON is a 72 y.o. male coming in with complaint of R thumb pain. Patient states that his pain increased more a couple weeks. Notices swelling in the thumb and hand. Denies any weakness. Injection does give him some relief.        Past Medical History:  Diagnosis Date   Hyperlipidemia    LDL goal = < 110   Hypertension    Prostate cancer [facility]) 2008   Dr Carin Primrose, [facility]   Past Surgical History:  Procedure Laterality Date   CERVICAL FUSION  2007   C5-C7 , Dr Wynetta Emery    COLONOSCOPY  2006   negative; [place], [place]   ESOPHAGOGASTRODUODENOSCOPY Left 10/28/2015   Procedure: ESOPHAGOGASTRODUODENOSCOPY (EGD);  Surgeon: Dorena Cookey, MD;  Location: [facility];  Service: Endoscopy;  Laterality: Left;   PROSTATECTOMY  11/2007   Dr.Moul, [facility]   WRIST SURGERY  1998   Hammate bone resection 2006   Social History   Socioeconomic History   Marital status: Married    Spouse name: Not on file   Number of children: 3   Years of education: Not on file   Highest education level: Not on file  Occupational History   Occupation: Teacher/Coach  Tobacco Use   Smoking status: Never   Smokeless tobacco: Never  Substance and Sexual Activity   Alcohol use: No   Drug use: No   Sexual activity: Not on file  Other Topics Concern   Not on file  Social History Narrative   Not on file    Social Determinants of Health   Financial Resource Strain: Low Risk  (02/20/2023)   Overall Financial Resource Strain (CARDIA)    Difficulty of Paying Living Expenses: Not hard at all  Food Insecurity: No Food Insecurity (02/20/2023)   Hunger Vital Sign    Worried About Running Out of Food in the Last Year: Never true    Ran Out of Food in the Last Year: Never true  Transportation Needs: No Transportation Needs (02/20/2023)   PRAPARE - Administrator, Civil Service (Medical): No    Lack of Transportation (Non-Medical): No  Physical Activity: Sufficiently Active (02/20/2023)   Exercise Vital Sign    Days of Exercise per Week: 4 days    Minutes of Exercise per Session: 60 min  Stress: No Stress Concern Present (02/20/2023)   [employer] of Occupational Health - Occupational Stress Questionnaire    Feeling of Stress : Not at all  Social Connections: Socially Integrated (02/20/2023)   Social Connection and Isolation Panel [NHANES]    Frequency of Communication with Friends and Family: More than three times a week    Frequency of Social Gatherings with Friends and Family: More than three times a week    Attends Religious Services: More than 4 times per year    Active Member of Clubs or Organizations: Yes    Attends  Club or Organization Meetings: Never    Marital Status: Married   No Known Allergies Family History  Problem Relation Age of Onset   Breast cancer Mother    Alzheimer's disease Mother    Colon cancer Paternal Grandmother    Diabetes Paternal Grandfather    Heart attack Paternal Grandfather 41   Breast cancer Maternal Grandmother    Heart attack Maternal Grandfather 72   Heart disease Father        Atrial fibrillation   Heart failure Father    Stroke Neg Hx     Current Outpatient Medications (Endocrine & Metabolic):    metFORMIN (GLUCOPHAGE-XR) 500 MG 24 hr tablet, Take 3 tablets (1,500 mg total) by mouth daily with breakfast.  Current Outpatient  Medications (Cardiovascular):    hydrochlorothiazide (HYDRODIURIL) 25 MG tablet, Take 1 tablet (25 mg total) by mouth daily.   losartan (COZAAR) 100 MG tablet, Take 1 tablet (100 mg total) by mouth daily.   rosuvastatin (CRESTOR) 5 MG tablet, Take 1 tablet (5 mg total) by mouth daily.     Current Outpatient Medications (Other):    b complex vitamins tablet, Take 1 tablet by mouth daily.   Cholecalciferol (VITAMIN D3) 50 MCG (2000 UT) capsule, Take by mouth.   Efinaconazole (JUBLIA) 10 % SOLN, Apply daily to affected nails for 48 weeks   ketoconazole (NIZORAL) 2 % cream, Apply 1 Application topically daily.   MegaRed Omega-3 Krill Oil 500 MG CAPS, as directed Orally   metroNIDAZOLE (METROCREAM) 0.75 % cream,    pantoprazole (PROTONIX) 40 MG tablet, Take 1 tablet (40 mg total) by mouth daily.   Sulfacetamide Sodium-Sulfur 9.8-4.8 % LIQD,    Reviewed prior external information including notes and imaging from  primary care provider As well as notes that were available from care everywhere and other healthcare systems.  Past medical history, social, surgical and family history all reviewed in electronic medical record.  No pertanent information unless stated regarding to the chief complaint.   Review of Systems:  No headache, visual changes, nausea, vomiting, diarrhea, constipation, dizziness, abdominal pain, skin rash, fevers, chills, night sweats, weight loss, swollen lymph nodes, body aches, joint swelling, chest pain, shortness of breath, mood changes. POSITIVE muscle aches  Objective  Blood pressure (!) 140/88, pulse 61, height 5\' 10"  (1.778 m), weight 203 lb (92.1 kg), SpO2 99%.   General: No apparent distress alert and oriented x3 mood and affect normal, dressed appropriately.  HEENT: Pupils equal, extraocular movements intact  Respiratory: Patient's speak in full sentences and does not appear short of breath  Cardiovascular: No lower extremity edema, non tender, no erythema   Left thumb does have a positive grind test noted.  Does have swelling noted as well noted of the right thumb.  Positive grind test  Procedure: Real-time Ultrasound Guided Injection of right CMC joint Device: GE Logiq Q7 Ultrasound guided injection is preferred based studies that show increased duration, increased effect, greater accuracy, decreased procedural pain, increased response rate, and decreased cost with ultrasound guided versus blind injection.  Verbal informed consent obtained.  Time-out conducted.  Noted no overlying erythema, induration, or other signs of local infection.  Skin prepped in a sterile fashion.  Local anesthesia: Topical Ethyl chloride.  With sterile technique and under real time ultrasound guidance: With a 25-gauge half inch needle injected with 0.5 cc of 0.5% Marcaine and 0.5 cc of Kenalog 40 mg/mL Completed without difficulty  Pain immediately improved suggesting accurate placement of the medication.  Advised  to call if fevers/chills, erythema, induration, drainage, or persistent bleeding.  Impression: Technically successful ultrasound guided injection.   Impression and Recommendations:     The above documentation has been reviewed and is accurate and complete Judi Saa, DO

## 2023-05-22 ENCOUNTER — Encounter: Payer: Self-pay | Admitting: Family Medicine

## 2023-05-22 ENCOUNTER — Ambulatory Visit: Payer: Medicare PPO | Admitting: Family Medicine

## 2023-05-22 ENCOUNTER — Other Ambulatory Visit: Payer: Self-pay

## 2023-05-22 VITALS — BP 140/88 | HR 61 | Ht 70.0 in | Wt 203.0 lb

## 2023-05-22 DIAGNOSIS — M1811 Unilateral primary osteoarthritis of first carpometacarpal joint, right hand: Secondary | ICD-10-CM | POA: Diagnosis not present

## 2023-05-22 DIAGNOSIS — M79644 Pain in right finger(s): Secondary | ICD-10-CM | POA: Diagnosis not present

## 2023-05-22 NOTE — Assessment & Plan Note (Signed)
Chronic problem, given injection today, discussed icing regimen and home exercise increase activity slowly.  Follow-up again in 6 to 12 months

## 2023-05-22 NOTE — Patient Instructions (Addendum)
Injected thumb today See me when you need me

## 2023-06-01 DIAGNOSIS — L821 Other seborrheic keratosis: Secondary | ICD-10-CM | POA: Diagnosis not present

## 2023-06-01 DIAGNOSIS — L570 Actinic keratosis: Secondary | ICD-10-CM | POA: Diagnosis not present

## 2023-06-01 DIAGNOSIS — L578 Other skin changes due to chronic exposure to nonionizing radiation: Secondary | ICD-10-CM | POA: Diagnosis not present

## 2023-06-09 DIAGNOSIS — M9902 Segmental and somatic dysfunction of thoracic region: Secondary | ICD-10-CM | POA: Diagnosis not present

## 2023-06-09 DIAGNOSIS — M9905 Segmental and somatic dysfunction of pelvic region: Secondary | ICD-10-CM | POA: Diagnosis not present

## 2023-06-09 DIAGNOSIS — M9903 Segmental and somatic dysfunction of lumbar region: Secondary | ICD-10-CM | POA: Diagnosis not present

## 2023-06-28 NOTE — Progress Notes (Unsigned)
Subjective:    Patient ID: Johnny [name], male    DOB: Feb 17, 1951, 72 y.o.   MRN: [id number]     HPI Johnny [name] is here for follow up of his chronic medical problems.  Somewhat compliant with diabetic diet - could do better with carbs.    He is exercising - gym 2-3 times a week, plays golf.    Medications and allergies reviewed with patient and updated if appropriate.  Current Outpatient Medications on File Prior to Visit  Medication Sig Dispense Refill   b complex vitamins tablet Take 1 tablet by mouth daily.     Cholecalciferol (VITAMIN D3) 50 MCG (2000 UT) capsule Take by mouth.     Efinaconazole (JUBLIA) 10 % SOLN Apply daily to affected nails for 48 weeks 8 mL 2   hydrochlorothiazide (HYDRODIURIL) 25 MG tablet Take 1 tablet (25 mg total) by mouth daily. 90 tablet 1   ketoconazole (NIZORAL) 2 % cream Apply 1 Application topically daily. 30 g 1   losartan (COZAAR) 100 MG tablet Take 1 tablet (100 mg total) by mouth daily. 90 tablet 1   MegaRed Omega-3 Krill Oil 500 MG CAPS as directed Orally     metFORMIN (GLUCOPHAGE-XR) 500 MG 24 hr tablet Take 3 tablets (1,500 mg total) by mouth daily with breakfast. 270 tablet 3   metroNIDAZOLE (METROCREAM) 0.75 % cream      pantoprazole (PROTONIX) 40 MG tablet Take 1 tablet (40 mg total) by mouth daily. 18090 tablet 1   rosuvastatin (CRESTOR) 5 MG tablet Take 1 tablet (5 mg total) by mouth daily. 90 tablet 1   Sulfacetamide Sodium-Sulfur 9.8-4.8 % LIQD      No current facility-administered medications on file prior to visit.     Review of Systems  Constitutional:  Negative for fever.  HENT:  Positive for sneezing.   Respiratory:  Negative for cough, shortness of breath and wheezing.   Cardiovascular:  Negative for chest pain, palpitations and leg swelling.  Neurological:  Negative for light-headedness and headaches.       Objective:   Vitals:   06/29/23 0810  BP: 136/80  Pulse: (!) 59  Temp: 98.5 F (36.9 C)  SpO2: 100%    BP Readings from Last 3 Encounters:  06/29/23 136/80  05/22/23 (!) 140/88  12/26/22 130/70   Wt Readings from Last 3 Encounters:  06/29/23 211 lb (95.7 kg)  05/22/23 203 lb (92.1 kg)  02/20/23 205 lb (93 kg)   Body mass index is 30.28 kg/m.    Physical Exam Constitutional:      General: He is not in acute distress.    Appearance: Normal appearance. He is not ill-appearing.  HENT:     Head: Normocephalic and atraumatic.  Eyes:     Conjunctiva/sclera: Conjunctivae normal.  Cardiovascular:     Rate and Rhythm: Normal rate and regular rhythm.     Heart sounds: Normal heart sounds.  Pulmonary:     Effort: Pulmonary effort is normal. No respiratory distress.     Breath sounds: Normal breath sounds. No wheezing or rales.  Musculoskeletal:     Right lower leg: No edema.     Left lower leg: No edema.  Skin:    General: Skin is warm and dry.     Findings: No rash.  Neurological:     Mental Status: He is alert. Mental status is at baseline.  Psychiatric:        Mood and Affect: Mood normal.  Lab Results  Component Value Date   WBC 8.1 12/26/2022   HGB 15.9 12/26/2022   HCT 47.1 12/26/2022   PLT 282.0 12/26/2022   GLUCOSE 212 (H) 06/29/2023   CHOL 122 06/29/2023   TRIG 112.0 06/29/2023   HDL 43.80 06/29/2023   LDLDIRECT 103.0 06/26/2017   LDLCALC 56 06/29/2023   ALT 46 06/29/2023   AST 27 06/29/2023   NA 134 (L) 06/29/2023   K 3.8 06/29/2023   CL 99 06/29/2023   CREATININE 0.86 06/29/2023   BUN 12 06/29/2023   CO2 27 06/29/2023   TSH 1.33 12/26/2022   PSA 1.13 12/26/2022   INR 1.16 10/28/2015   HGBA1C 8.4 (A) 06/29/2023   HGBA1C 8.4 06/29/2023   HGBA1C 8.4 (A) 06/29/2023   HGBA1C 8.4 (A) 06/29/2023   MICROALBUR 1.1 12/26/2022     Assessment & Plan:    See Problem List for Assessment and Plan of chronic medical problems.

## 2023-06-28 NOTE — Patient Instructions (Addendum)
     Flu immunization administered today.      Blood work was ordered.   The lab is on the first floor.    Medications changes include :   none     Return in about 6 months (around 12/28/2023) for Physical Exam.

## 2023-06-29 ENCOUNTER — Encounter: Payer: Self-pay | Admitting: Internal Medicine

## 2023-06-29 ENCOUNTER — Ambulatory Visit: Payer: Medicare PPO | Admitting: Internal Medicine

## 2023-06-29 VITALS — BP 136/80 | HR 59 | Temp 98.5°F | Ht 70.0 in | Wt 211.0 lb

## 2023-06-29 DIAGNOSIS — I10 Essential (primary) hypertension: Secondary | ICD-10-CM | POA: Diagnosis not present

## 2023-06-29 DIAGNOSIS — Z7984 Long term (current) use of oral hypoglycemic drugs: Secondary | ICD-10-CM

## 2023-06-29 DIAGNOSIS — E1169 Type 2 diabetes mellitus with other specified complication: Secondary | ICD-10-CM | POA: Diagnosis not present

## 2023-06-29 DIAGNOSIS — E7849 Other hyperlipidemia: Secondary | ICD-10-CM

## 2023-06-29 DIAGNOSIS — K2270 Barrett's esophagus without dysplasia: Secondary | ICD-10-CM

## 2023-06-29 LAB — COMPREHENSIVE METABOLIC PANEL
ALT: 46 U/L (ref 0–53)
AST: 27 U/L (ref 0–37)
Albumin: 4.2 g/dL (ref 3.5–5.2)
Alkaline Phosphatase: 61 U/L (ref 39–117)
BUN: 12 mg/dL (ref 6–23)
CO2: 27 meq/L (ref 19–32)
Calcium: 9.5 mg/dL (ref 8.4–10.5)
Chloride: 99 meq/L (ref 96–112)
Creatinine, Ser: 0.86 mg/dL (ref 0.40–1.50)
GFR: 86.66 mL/min (ref >60.00)
Glucose, Bld: 212 mg/dL — ABNORMAL HIGH (ref 70–99)
Potassium: 3.8 meq/L (ref 3.5–5.1)
Sodium: 134 meq/L — ABNORMAL LOW (ref 135–145)
Total Bilirubin: 0.9 mg/dL (ref 0.2–1.2)
Total Protein: 7.4 g/dL (ref 6.0–8.3)

## 2023-06-29 LAB — POCT GLYCOSYLATED HEMOGLOBIN (HGB A1C)
HbA1c POC (<> result, manual entry): 8.4 % (ref 4.0–5.6)
HbA1c, POC (controlled diabetic range): 8.4 % — AB (ref 0.0–7.0)
HbA1c, POC (prediabetic range): 8.4 % — AB (ref 5.7–6.4)
Hemoglobin A1C: 8.4 % — AB (ref 4.0–5.6)

## 2023-06-29 LAB — LIPID PANEL
Cholesterol: 122 mg/dL (ref 0–200)
HDL: 43.8 mg/dL (ref >39.00)
LDL Cholesterol: 56 mg/dL (ref 0–99)
NonHDL: 78.06
Total CHOL/HDL Ratio: 3
Triglycerides: 112 mg/dL (ref 0.0–149.0)
VLDL: 22.4 mg/dL (ref 0.0–40.0)

## 2023-06-29 MED ORDER — EMPAGLIFLOZIN 10 MG PO TABS: 10.0000 mg | ORAL_TABLET | Freq: Every day | ORAL | 5 refills | Status: DC

## 2023-06-29 NOTE — Assessment & Plan Note (Signed)
Chronic H/o Barrett's esophagus Continue pantoprazole 40 mg daily Up-to-date with EGD per patient Denies GERD, dysphagia Follows with GI yearly

## 2023-06-29 NOTE — Assessment & Plan Note (Addendum)
Chronic BP well controlled Continue hctz 25 mg daily, losartan 100 mg daily cmp  EKG - Sinus Bradycardia w/ first degree AV block @ 58 bpm, incomplete RBBB.  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  Compared to previous EKG 2017, bradycardia, first degree AV black and RBBB new.  He is asymptomatic and exercising regularly.  No further evaluation is needed.

## 2023-06-29 NOTE — Assessment & Plan Note (Addendum)
Chronic With hyperlipidemia  Lab Results  Component Value Date   HGBA1C 8.7 (H) 12/26/2022   Sugars not ideally controlled He does not check his sugars Check A1c-8.4%, improved, but not ideally controlled Start Jardiance 10 mg daily Continue metformin 1500 mg xr daily Stressed regular exercise, diabetic diet

## 2023-06-29 NOTE — Assessment & Plan Note (Signed)
Chronic Check lipid panel, cmp Continue crestor 5 mg daily Regular exercise and healthy diet encouraged   Lab Results  Component Value Date   LDLCALC 52 12/26/2022

## 2023-07-02 ENCOUNTER — Other Ambulatory Visit: Payer: Self-pay | Admitting: Internal Medicine

## 2023-07-02 DIAGNOSIS — I10 Essential (primary) hypertension: Secondary | ICD-10-CM

## 2023-07-07 DIAGNOSIS — M9905 Segmental and somatic dysfunction of pelvic region: Secondary | ICD-10-CM | POA: Diagnosis not present

## 2023-07-07 DIAGNOSIS — M9902 Segmental and somatic dysfunction of thoracic region: Secondary | ICD-10-CM | POA: Diagnosis not present

## 2023-07-07 DIAGNOSIS — M9903 Segmental and somatic dysfunction of lumbar region: Secondary | ICD-10-CM | POA: Diagnosis not present

## 2023-08-04 DIAGNOSIS — M9903 Segmental and somatic dysfunction of lumbar region: Secondary | ICD-10-CM | POA: Diagnosis not present

## 2023-08-04 DIAGNOSIS — M9902 Segmental and somatic dysfunction of thoracic region: Secondary | ICD-10-CM | POA: Diagnosis not present

## 2023-08-04 DIAGNOSIS — M9905 Segmental and somatic dysfunction of pelvic region: Secondary | ICD-10-CM | POA: Diagnosis not present

## 2023-08-25 DIAGNOSIS — M9903 Segmental and somatic dysfunction of lumbar region: Secondary | ICD-10-CM | POA: Diagnosis not present

## 2023-08-25 DIAGNOSIS — M9902 Segmental and somatic dysfunction of thoracic region: Secondary | ICD-10-CM | POA: Diagnosis not present

## 2023-08-25 DIAGNOSIS — M9905 Segmental and somatic dysfunction of pelvic region: Secondary | ICD-10-CM | POA: Diagnosis not present

## 2023-09-01 DIAGNOSIS — M9905 Segmental and somatic dysfunction of pelvic region: Secondary | ICD-10-CM | POA: Diagnosis not present

## 2023-09-01 DIAGNOSIS — M9903 Segmental and somatic dysfunction of lumbar region: Secondary | ICD-10-CM | POA: Diagnosis not present

## 2023-09-01 DIAGNOSIS — M9902 Segmental and somatic dysfunction of thoracic region: Secondary | ICD-10-CM | POA: Diagnosis not present

## 2023-09-15 DIAGNOSIS — K2270 Barrett's esophagus without dysplasia: Secondary | ICD-10-CM | POA: Diagnosis not present

## 2023-09-15 DIAGNOSIS — Z1211 Encounter for screening for malignant neoplasm of colon: Secondary | ICD-10-CM | POA: Diagnosis not present

## 2023-09-15 DIAGNOSIS — K317 Polyp of stomach and duodenum: Secondary | ICD-10-CM | POA: Diagnosis not present

## 2023-09-15 DIAGNOSIS — K219 Gastro-esophageal reflux disease without esophagitis: Secondary | ICD-10-CM | POA: Diagnosis not present

## 2023-09-17 DIAGNOSIS — K317 Polyp of stomach and duodenum: Secondary | ICD-10-CM | POA: Diagnosis not present

## 2023-09-17 DIAGNOSIS — K219 Gastro-esophageal reflux disease without esophagitis: Secondary | ICD-10-CM | POA: Diagnosis not present

## 2023-09-17 DIAGNOSIS — K2270 Barrett's esophagus without dysplasia: Secondary | ICD-10-CM | POA: Diagnosis not present

## 2023-09-19 ENCOUNTER — Other Ambulatory Visit: Payer: Self-pay | Admitting: Internal Medicine

## 2023-09-29 DIAGNOSIS — M9905 Segmental and somatic dysfunction of pelvic region: Secondary | ICD-10-CM | POA: Diagnosis not present

## 2023-09-29 DIAGNOSIS — M9902 Segmental and somatic dysfunction of thoracic region: Secondary | ICD-10-CM | POA: Diagnosis not present

## 2023-09-29 DIAGNOSIS — M9903 Segmental and somatic dysfunction of lumbar region: Secondary | ICD-10-CM | POA: Diagnosis not present

## 2023-10-27 DIAGNOSIS — M9902 Segmental and somatic dysfunction of thoracic region: Secondary | ICD-10-CM | POA: Diagnosis not present

## 2023-10-27 DIAGNOSIS — M9905 Segmental and somatic dysfunction of pelvic region: Secondary | ICD-10-CM | POA: Diagnosis not present

## 2023-10-27 DIAGNOSIS — M9903 Segmental and somatic dysfunction of lumbar region: Secondary | ICD-10-CM | POA: Diagnosis not present

## 2023-10-29 DIAGNOSIS — N5203 Combined arterial insufficiency and corporo-venous occlusive erectile dysfunction: Secondary | ICD-10-CM | POA: Diagnosis not present

## 2023-10-29 DIAGNOSIS — C61 Malignant neoplasm of prostate: Secondary | ICD-10-CM | POA: Diagnosis not present

## 2023-10-29 DIAGNOSIS — R9721 Rising PSA following treatment for malignant neoplasm of prostate: Secondary | ICD-10-CM | POA: Diagnosis not present

## 2023-11-18 DIAGNOSIS — R9721 Rising PSA following treatment for malignant neoplasm of prostate: Secondary | ICD-10-CM | POA: Diagnosis not present

## 2023-11-18 DIAGNOSIS — C61 Malignant neoplasm of prostate: Secondary | ICD-10-CM | POA: Diagnosis not present

## 2023-11-19 DIAGNOSIS — R9721 Rising PSA following treatment for malignant neoplasm of prostate: Secondary | ICD-10-CM | POA: Diagnosis not present

## 2023-11-19 DIAGNOSIS — C61 Malignant neoplasm of prostate: Secondary | ICD-10-CM | POA: Diagnosis not present

## 2023-11-19 DIAGNOSIS — N5203 Combined arterial insufficiency and corporo-venous occlusive erectile dysfunction: Secondary | ICD-10-CM | POA: Diagnosis not present

## 2023-11-24 DIAGNOSIS — M9903 Segmental and somatic dysfunction of lumbar region: Secondary | ICD-10-CM | POA: Diagnosis not present

## 2023-11-24 DIAGNOSIS — M9902 Segmental and somatic dysfunction of thoracic region: Secondary | ICD-10-CM | POA: Diagnosis not present

## 2023-11-24 DIAGNOSIS — M9905 Segmental and somatic dysfunction of pelvic region: Secondary | ICD-10-CM | POA: Diagnosis not present

## 2023-12-02 DIAGNOSIS — R9721 Rising PSA following treatment for malignant neoplasm of prostate: Secondary | ICD-10-CM | POA: Diagnosis not present

## 2023-12-02 DIAGNOSIS — C61 Malignant neoplasm of prostate: Secondary | ICD-10-CM | POA: Diagnosis not present

## 2023-12-08 DIAGNOSIS — L570 Actinic keratosis: Secondary | ICD-10-CM | POA: Diagnosis not present

## 2023-12-08 DIAGNOSIS — L578 Other skin changes due to chronic exposure to nonionizing radiation: Secondary | ICD-10-CM | POA: Diagnosis not present

## 2023-12-08 DIAGNOSIS — L821 Other seborrheic keratosis: Secondary | ICD-10-CM | POA: Diagnosis not present

## 2023-12-10 DIAGNOSIS — M9903 Segmental and somatic dysfunction of lumbar region: Secondary | ICD-10-CM | POA: Diagnosis not present

## 2023-12-10 DIAGNOSIS — M9902 Segmental and somatic dysfunction of thoracic region: Secondary | ICD-10-CM | POA: Diagnosis not present

## 2023-12-10 DIAGNOSIS — M9905 Segmental and somatic dysfunction of pelvic region: Secondary | ICD-10-CM | POA: Diagnosis not present

## 2023-12-23 DIAGNOSIS — M9903 Segmental and somatic dysfunction of lumbar region: Secondary | ICD-10-CM | POA: Diagnosis not present

## 2023-12-23 DIAGNOSIS — M9902 Segmental and somatic dysfunction of thoracic region: Secondary | ICD-10-CM | POA: Diagnosis not present

## 2023-12-23 DIAGNOSIS — M9905 Segmental and somatic dysfunction of pelvic region: Secondary | ICD-10-CM | POA: Diagnosis not present

## 2023-12-24 ENCOUNTER — Other Ambulatory Visit: Payer: Self-pay | Admitting: Internal Medicine

## 2023-12-25 ENCOUNTER — Other Ambulatory Visit: Payer: Self-pay | Admitting: Internal Medicine

## 2023-12-25 DIAGNOSIS — I10 Essential (primary) hypertension: Secondary | ICD-10-CM

## 2023-12-28 ENCOUNTER — Encounter: Payer: Self-pay | Admitting: Internal Medicine

## 2023-12-28 ENCOUNTER — Encounter: Payer: Medicare PPO | Admitting: Internal Medicine

## 2023-12-28 NOTE — Patient Instructions (Addendum)
 Medications changes include :   triamcinolone 0.1% cream twice daily as needed     Return in about 6 months (around 06/29/2024) for follow up.    Health Maintenance, Male Adopting a healthy lifestyle and getting preventive care are important in promoting health and wellness. Ask your health care provider about: The right schedule for you to have regular tests and exams. Things you can do on your own to prevent diseases and keep yourself healthy. What should I know about diet, weight, and exercise? Eat a healthy diet  Eat a diet that includes plenty of vegetables, fruits, low-fat dairy products, and lean protein. Do not eat a lot of foods that are high in solid fats, added sugars, or sodium. Maintain a healthy weight Body mass index (BMI) is a measurement that can be used to identify possible weight problems. It estimates body fat based on height and weight. Your health care provider can help determine your BMI and help you achieve or maintain a healthy weight. Get regular exercise Get regular exercise. This is one of the most important things you can do for your health. Most adults should: Exercise for at least 150 minutes each week. The exercise should increase your heart rate and make you sweat (moderate-intensity exercise). Do strengthening exercises at least twice a week. This is in addition to the moderate-intensity exercise. Spend less time sitting. Even light physical activity can be beneficial. Watch cholesterol and blood lipids Have your blood tested for lipids and cholesterol at 73 years of age, then have this test every 5 years. You may need to have your cholesterol levels checked more often if: Your lipid or cholesterol levels are high. You are older than 73 years of age. You are at high risk for heart disease. What should I know about cancer screening? Many types of cancers can be detected early and may often be prevented. Depending on your health history and  family history, you may need to have cancer screening at various ages. This may include screening for: Colorectal cancer. Prostate cancer. Skin cancer. Lung cancer. What should I know about heart disease, diabetes, and high blood pressure? Blood pressure and heart disease High blood pressure causes heart disease and increases the risk of stroke. This is more likely to develop in people who have high blood pressure readings or are overweight. Talk with your health care provider about your target blood pressure readings. Have your blood pressure checked: Every 3-5 years if you are 28-74 years of age. Every year if you are 46 years old or older. If you are between the ages of 50 and 42 and are a current or former smoker, ask your health care provider if you should have a one-time screening for abdominal aortic aneurysm (AAA). Diabetes Have regular diabetes screenings. This checks your fasting blood sugar level. Have the screening done: Once every three years after age 45 if you are at a normal weight and have a low risk for diabetes. More often and at a younger age if you are overweight or have a high risk for diabetes. What should I know about preventing infection? Hepatitis B If you have a higher risk for hepatitis B, you should be screened for this virus. Talk with your health care provider to find out if you are at risk for hepatitis B infection. Hepatitis C Blood testing is recommended for: Everyone born from [birth year] through 1965. Anyone with known risk factors for hepatitis C. Sexually transmitted infections (STIs) You  for hepatitis C. Sexually transmitted infections (STIs) You should be screened each year for STIs, including gonorrhea and chlamydia, if: You are sexually active and are younger than 73 years of age. You are older than 73 years of age and your health care provider tells you that you are at risk for this type of infection. Your sexual activity has changed since you were last screened, and you are at increased risk  for chlamydia or gonorrhea. Ask your health care provider if you are at risk. Ask your health care provider about whether you are at high risk for HIV. Your health care provider may recommend a prescription medicine to help prevent HIV infection. If you choose to take medicine to prevent HIV, you should first get tested for HIV. You should then be tested every 3 months for as long as you are taking the medicine. Follow these instructions at home: Alcohol use Do not drink alcohol if your health care provider tells you not to drink. If you drink alcohol: Limit how much you have to 0-2 drinks a day. Know how much alcohol is in your drink. In the [place] one drink equals one 12 oz bottle of beer (355 mL), one 5 oz glass of wine (148 mL), or one 1 oz glass of hard liquor (44 mL). Lifestyle Do not use any products that contain nicotine or tobacco. These products include cigarettes, chewing tobacco, and vaping devices, such as e-cigarettes. If you need help quitting, ask your health care provider. Do not use street drugs. Do not share needles. Ask your health care provider for help if you need support or information about quitting drugs. General instructions Schedule regular health, dental, and eye exams. Stay current with your vaccines. Tell your health care provider if: You often feel depressed. You have ever been abused or do not feel safe at home. Summary Adopting a healthy lifestyle and getting preventive care are important in promoting health and wellness. Follow your health care provider's instructions about healthy diet, exercising, and getting tested or screened for diseases. Follow your health care provider's instructions on monitoring your cholesterol and blood pressure. This information is not intended to replace advice given to you by your health care provider. Make sure you discuss any questions you have with your health care provider. Document Revised: 01/21/2021 Document Reviewed:  01/21/2021 Elsevier Patient Education  2024 [employer].

## 2023-12-28 NOTE — Progress Notes (Unsigned)
 Subjective:    Patient ID: Johnny [name], male    DOB: 1951/04/13, 73 y.o.   MRN: [id number]     HPI Johnny [name] is here for a physical exam and his chronic medical problems.   A1c today is 7.5%.  recent was eating more sugar but that was on vacation.   He is exercising.   Having some pain right medial ankle intermittently-he had it the other day when he was golfing, but it only lasted a short time and then resolved.  He has had this in the past as well and is not sure what caused it-not related to specific activity, shoes, positioning.  No pain currently.  He has also had some pain in his left proximal lower leg that is intermittent as well   Medications and allergies reviewed with patient and updated if appropriate.  Current Outpatient Medications on File Prior to Visit  Medication Sig Dispense Refill   b complex vitamins tablet Take 1 tablet by mouth daily.     Cholecalciferol (VITAMIN D3) 50 MCG (2000 UT) capsule Take by mouth.     Efinaconazole (JUBLIA) 10 % SOLN Apply daily to affected nails for 48 weeks 8 mL 2   hydrochlorothiazide (HYDRODIURIL) 25 MG tablet TAKE 1 TABLET (25 MG TOTAL) BY MOUTH DAILY. 90 tablet 1   JARDIANCE 10 MG TABS tablet TAKE 1 TABLET BY MOUTH DAILY BEFORE BREAKFAST. 30 tablet 5   ketoconazole (NIZORAL) 2 % cream APPLY 1 APPLICATION TOPICALLY DAILY 30 g 1   losartan (COZAAR) 100 MG tablet TAKE 1 TABLET BY MOUTH EVERY DAY 90 tablet 1   MegaRed Omega-3 Krill Oil 500 MG CAPS as directed Orally     metFORMIN (GLUCOPHAGE-XR) 500 MG 24 hr tablet TAKE 3 TABLETS (1,500 MG TOTAL) BY MOUTH DAILY WITH BREAKFAST 270 tablet 3   metroNIDAZOLE (METROCREAM) 0.75 % cream      pantoprazole (PROTONIX) 40 MG tablet Take 1 tablet (40 mg total) by mouth daily. 18090 tablet 1   rosuvastatin (CRESTOR) 5 MG tablet TAKE 1 TABLET (5 MG TOTAL) BY MOUTH DAILY. 90 tablet 1   Sulfacetamide Sodium-Sulfur 9.8-4.8 % LIQD      No current facility-administered medications on file prior  to visit.    Review of Systems  Constitutional:  Negative for fever.  HENT:  Negative for trouble swallowing.   Eyes:  Negative for visual disturbance.  Respiratory:  Negative for cough, shortness of breath and wheezing.   Cardiovascular:  Positive for leg swelling. Negative for chest pain and palpitations.  Gastrointestinal:  Negative for abdominal pain, blood in stool, constipation and diarrhea.       Occ gerd  Genitourinary:  Negative for difficulty urinating.  Musculoskeletal:  Positive for arthralgias (right thumb). Negative for back pain.  Skin:  Negative for rash.  Neurological:  Negative for light-headedness and headaches.  Psychiatric/Behavioral:  Negative for dysphoric mood. The patient is not nervous/anxious.        Objective:   Vitals:   12/29/23 0906  BP: 130/72  Pulse: 62  Temp: 98 F (36.7 C)  SpO2: 97%   Filed Weights   12/29/23 0906  Weight: 200 lb (90.7 kg)   Body mass index is 28.7 kg/m.  BP Readings from Last 3 Encounters:  12/29/23 130/72  06/29/23 136/80  05/22/23 (!) 140/88    Wt Readings from Last 3 Encounters:  12/29/23 200 lb (90.7 kg)  06/29/23 211 lb (95.7 kg)  05/22/23 203 lb (92.1 kg)  Physical Exam Constitutional: He appears well-developed and well-nourished. No distress.  HENT:  Head: Normocephalic and atraumatic.  Right Ear: External ear normal.  Left Ear: External ear normal.  Normal ear canals and TM b/l  Mouth/Throat: Oropharynx is clear and moist. Eyes: Conjunctivae and EOM are normal.  Neck: Neck supple. No tracheal deviation present. No thyromegaly present.  No carotid bruit  Cardiovascular: Normal rate, regular rhythm, normal heart sounds and intact distal pulses.   No murmur heard.  Trace bilateral lower extremity edema.  Bilateral lower extremity varicosities Pulmonary/Chest: Effort normal and breath sounds normal. No respiratory distress. He has no wheezes. He has no rales.  Abdominal: Soft. He exhibits no  distension. There is no tenderness.  Genitourinary: deferred  Lymphadenopathy:   He has no cervical adenopathy.  Skin: Skin is warm and dry. He is not diaphoretic.  Psychiatric: He has a normal mood and affect. His behavior is normal.         Assessment & Plan:   Physical exam: Screening blood work  ordered Exercise   regular-gym, golf Weight is good Substance abuse   none Sees derm   Reviewed recommended immunizations.   Health Maintenance  Topic Date Due   Zoster Vaccines- Shingrix (1 of 2) Never done   Diabetic kidney evaluation - Urine ACR  12/26/2023   FOOT EXAM  12/26/2023   HEMOGLOBIN A1C  12/28/2023   Medicare Annual Wellness (AWV)  02/20/2024   DTaP/Tdap/Td (2 - Td or Tdap) 06/28/2024 (Originally 02/02/2022)   OPHTHALMOLOGY EXAM  01/20/2024   INFLUENZA VACCINE  04/15/2024   Diabetic kidney evaluation - eGFR measurement  06/28/2024   Colonoscopy  01/10/2026   Pneumonia Vaccine 55+ Years old  Completed   Hepatitis C Screening  Completed   HPV VACCINES  Aged Out   Meningococcal B Vaccine  Aged Out   COVID-19 Vaccine  Discontinued     See Problem List for Assessment and Plan of chronic medical problems.

## 2023-12-29 ENCOUNTER — Ambulatory Visit (INDEPENDENT_AMBULATORY_CARE_PROVIDER_SITE_OTHER): Payer: Medicare PPO | Admitting: Internal Medicine

## 2023-12-29 VITALS — BP 130/72 | HR 62 | Temp 98.0°F | Ht 70.0 in | Wt 200.0 lb

## 2023-12-29 DIAGNOSIS — E7849 Other hyperlipidemia: Secondary | ICD-10-CM | POA: Diagnosis not present

## 2023-12-29 DIAGNOSIS — E1169 Type 2 diabetes mellitus with other specified complication: Secondary | ICD-10-CM

## 2023-12-29 DIAGNOSIS — K2270 Barrett's esophagus without dysplasia: Secondary | ICD-10-CM

## 2023-12-29 DIAGNOSIS — I83893 Varicose veins of bilateral lower extremities with other complications: Secondary | ICD-10-CM

## 2023-12-29 DIAGNOSIS — Z0000 Encounter for general adult medical examination without abnormal findings: Secondary | ICD-10-CM

## 2023-12-29 DIAGNOSIS — Z7984 Long term (current) use of oral hypoglycemic drugs: Secondary | ICD-10-CM | POA: Diagnosis not present

## 2023-12-29 DIAGNOSIS — Z8546 Personal history of malignant neoplasm of prostate: Secondary | ICD-10-CM

## 2023-12-29 DIAGNOSIS — I10 Essential (primary) hypertension: Secondary | ICD-10-CM

## 2023-12-29 LAB — CBC WITH DIFFERENTIAL/PLATELET
Basophils Absolute: 0 10*3/uL (ref 0.0–0.1)
Basophils Relative: 0.2 % (ref 0.0–3.0)
Eosinophils Absolute: 0.1 10*3/uL (ref 0.0–0.7)
Eosinophils Relative: 1.6 % (ref 0.0–5.0)
HCT: 50 % (ref 39.0–52.0)
Hemoglobin: 16.6 g/dL (ref 13.0–17.0)
Lymphocytes Relative: 18.3 % (ref 12.0–46.0)
Lymphs Abs: 1.4 10*3/uL (ref 0.7–4.0)
MCHC: 33.3 g/dL (ref 30.0–36.0)
MCV: 86 fl (ref 78.0–100.0)
Monocytes Absolute: 0.8 10*3/uL (ref 0.1–1.0)
Monocytes Relative: 9.8 % (ref 3.0–12.0)
Neutro Abs: 5.5 10*3/uL (ref 1.4–7.7)
Neutrophils Relative %: 70.1 % (ref 43.0–77.0)
Platelets: 318 10*3/uL (ref 150.0–400.0)
RBC: 5.82 Mil/uL — ABNORMAL HIGH (ref 4.22–5.81)
RDW: 14.7 % (ref 11.5–15.5)
WBC: 7.9 10*3/uL (ref 4.0–10.5)

## 2023-12-29 LAB — COMPREHENSIVE METABOLIC PANEL WITH GFR
ALT: 36 U/L (ref 0–53)
AST: 28 U/L (ref 0–37)
Albumin: 4.5 g/dL (ref 3.5–5.2)
Alkaline Phosphatase: 62 U/L (ref 39–117)
BUN: 14 mg/dL (ref 6–23)
CO2: 26 meq/L (ref 19–32)
Calcium: 9.8 mg/dL (ref 8.4–10.5)
Chloride: 100 meq/L (ref 96–112)
Creatinine, Ser: 0.85 mg/dL (ref 0.40–1.50)
GFR: 86.66 mL/min (ref >60.00)
Glucose, Bld: 154 mg/dL — ABNORMAL HIGH (ref 70–99)
Potassium: 3.9 meq/L (ref 3.5–5.1)
Sodium: 135 meq/L (ref 135–145)
Total Bilirubin: 1 mg/dL (ref 0.2–1.2)
Total Protein: 7.6 g/dL (ref 6.0–8.3)

## 2023-12-29 LAB — POCT GLYCOSYLATED HEMOGLOBIN (HGB A1C)
HbA1c POC (<> result, manual entry): 7.5 % (ref 4.0–5.6)
HbA1c, POC (controlled diabetic range): 7.5 % — AB (ref 0.0–7.0)
HbA1c, POC (prediabetic range): 7.5 % — AB (ref 5.7–6.4)
Hemoglobin A1C: 7.5 % — AB (ref 4.0–5.6)

## 2023-12-29 LAB — LIPID PANEL
Cholesterol: 125 mg/dL (ref 0–200)
HDL: 45.3 mg/dL (ref >39.00)
LDL Cholesterol: 56 mg/dL (ref 0–99)
NonHDL: 79.98
Total CHOL/HDL Ratio: 3
Triglycerides: 120 mg/dL (ref 0.0–149.0)
VLDL: 24 mg/dL (ref 0.0–40.0)

## 2023-12-29 LAB — MICROALBUMIN / CREATININE URINE RATIO
Creatinine,U: 88.4 mg/dL
Microalb Creat Ratio: 11.6 mg/g (ref 0.0–30.0)
Microalb, Ur: 1 mg/dL (ref 0.0–1.9)

## 2023-12-29 MED ORDER — EMPAGLIFLOZIN 25 MG PO TABS: 25.0000 mg | ORAL_TABLET | Freq: Every day | ORAL | 5 refills | Status: DC

## 2023-12-29 NOTE — Assessment & Plan Note (Addendum)
 Chronic Follows with urology regularly Psa did increase slightly Urology has advised him to see oncology

## 2023-12-29 NOTE — Assessment & Plan Note (Signed)
 Chronic Check lipid panel, cmp Continue crestor 5 mg daily Regular exercise and healthy diet encouraged  Lab Results  Component Value Date   LDLCALC 56 06/29/2023

## 2023-12-29 NOTE — Assessment & Plan Note (Addendum)
 Chronic With hyperlipidemia, hyperglycemia  Lab Results  Component Value Date   HGBA1C 8.4 (A) 06/29/2023   HGBA1C 8.4 06/29/2023   HGBA1C 8.4 (A) 06/29/2023   HGBA1C 8.4 (A) 06/29/2023   A1c today 7.5% Sugars not ideally controlled He does not check his sugars Check A1c, urine microalbumin/creatinine ratio Continue Jardiance but increase to 25 mg daily Continue metformin 1500 mg xr daily Stressed regular exercise, diabetic diet

## 2023-12-29 NOTE — Assessment & Plan Note (Addendum)
 Chronic H/o Barrett's esophagus Continue pantoprazole 40 mg daily Up-to-date with EGD - to have another one next month due to indeterminate biopsy Denies GERD, dysphagia Follows with GI yearly

## 2023-12-29 NOTE — Assessment & Plan Note (Signed)
Chronic BP well controlled Continue hctz 25 mg daily, losartan 100 mg daily cmp

## 2023-12-29 NOTE — Assessment & Plan Note (Signed)
 Chronic Has had some proximal left lower leg discomfort at times which does overlie an area of varicose veins which is likely the cause of the pain He does have some mild swelling at times Discussed possibly seeing vascular surgery to discuss treatment Could also consider trying to wear compression socks

## 2023-12-30 ENCOUNTER — Encounter: Payer: Self-pay | Admitting: Internal Medicine

## 2023-12-31 ENCOUNTER — Ambulatory Visit (INDEPENDENT_AMBULATORY_CARE_PROVIDER_SITE_OTHER)

## 2023-12-31 ENCOUNTER — Ambulatory Visit: Admitting: Sports Medicine

## 2023-12-31 VITALS — BP 132/78 | HR 64 | Ht 70.0 in | Wt 202.0 lb

## 2023-12-31 DIAGNOSIS — M25571 Pain in right ankle and joints of right foot: Secondary | ICD-10-CM

## 2023-12-31 DIAGNOSIS — G8929 Other chronic pain: Secondary | ICD-10-CM | POA: Diagnosis not present

## 2023-12-31 MED ORDER — MELOXICAM 15 MG PO TABS: 15.0000 mg | ORAL_TABLET | Freq: Every day | ORAL | 0 refills | Status: AC

## 2023-12-31 NOTE — Patient Instructions (Addendum)
-   Start meloxicam 15 mg daily x2 weeks.  If still having pain after 2 weeks, complete 3rd-week of NSAID. May use remaining NSAID as needed once daily for pain control.  Do not to use additional over-the-counter NSAIDs (ibuprofen, naproxen, Advil, Aleve, etc.) while taking prescription NSAIDs.  May use Tylenol [phone] mg 2 to 3 times a day for breakthrough pain. Ankle HEP  4 week follow up

## 2023-12-31 NOTE — Progress Notes (Signed)
 Aleen Sells D.[facility] [address] [place] [address] Phone: [phone]   Assessment and Plan:     1. Chronic pain of right ankle (Primary) -Chronic with exacerbation, initial sports medicine visit - Chronic medial right ankle pain without specific MOI.  Most consistent with strain, likely of anterior tibialis tendon due to physical activity including golfing.  Differential includes flare of mild osteoarthritis versus gout, though patient does not have pain with passive ROM, and no hot/red/swollen joint history, making these diagnoses less likely - Start meloxicam 15 mg daily x2 weeks.  If still having pain after 2 weeks, complete 3rd-week of NSAID. May use remaining NSAID as needed once daily for pain control.  Do not to use additional over-the-counter NSAIDs (ibuprofen, naproxen, Advil, Aleve, etc.) while taking prescription NSAIDs.  May use Tylenol [phone] mg 2 to 3 times a day for breakthrough pain. -Start HEP for ankle - Ankle x-ray obtained in clinic.  My interpretation: No acute fracture or dislocation.  Mild degenerative changes in medial and lateral malleoli, midfoot, calcaneal spurs  15 additional minutes spent for educating Therapeutic Home Exercise Program.  This included exercises focusing on stretching, strengthening, with focus on eccentric aspects.   Long term goals include an improvement in range of motion, strength, endurance as well as avoiding reinjury. Patient's frequency would include in 1-2 times a day, 3-5 times a week for a duration of 6-12 weeks. Proper technique shown and discussed handout in great detail with ATC.  All questions were discussed and answered.      Pertinent previous records reviewed include none  Follow Up: 4 weeks for reevaluation.  If no improvement or worsening of symptoms, could consider intra-articular CSI versus physical therapy.  If symptoms have improved, could consider getting a baseline uric  acid level to rule out gout   Subjective:   I, Moenique Parris, am serving as a Neurosurgeon for Doctor [name]  Chief Complaint: right ankle pain   HPI:   12/31/23 Patient is a 73 year old male with right ankle pain. Patient states pain for a couple month of intermittent pain. Intense pain started Monday but has eased off sense then. No MOI. Medial ankle pain with some swelling. Decreased ROM due to swelling. No numbness or tingling. No meds for the pain. Antalgic gait but depends on the day   Relevant Historical Information:   Hypertension, DM type II, Barrett's esophagus, history of prostate cancer  Additional pertinent review of systems negative.   Current Outpatient Medications:    b complex vitamins tablet, Take 1 tablet by mouth daily., Disp: , Rfl:    Cholecalciferol (VITAMIN D3) 50 MCG (2000 UT) capsule, Take by mouth., Disp: , Rfl:    Efinaconazole (JUBLIA) 10 % SOLN, Apply daily to affected nails for 48 weeks, Disp: 8 mL, Rfl: 2   empagliflozin (JARDIANCE) 25 MG TABS tablet, Take 1 tablet (25 mg total) by mouth daily before breakfast., Disp: 30 tablet, Rfl: 5   hydrochlorothiazide (HYDRODIURIL) 25 MG tablet, TAKE 1 TABLET (25 MG TOTAL) BY MOUTH DAILY., Disp: 90 tablet, Rfl: 1   ketoconazole (NIZORAL) 2 % cream, APPLY 1 APPLICATION TOPICALLY DAILY, Disp: 30 g, Rfl: 1   losartan (COZAAR) 100 MG tablet, TAKE 1 TABLET BY MOUTH EVERY DAY, Disp: 90 tablet, Rfl: 1   MegaRed Omega-3 Krill Oil 500 MG CAPS, as directed Orally, Disp: , Rfl:    meloxicam (MOBIC) 15 MG tablet, Take 1 tablet (15 mg total)  by mouth daily., Disp: 30 tablet, Rfl: 0   metFORMIN (GLUCOPHAGE-XR) 500 MG 24 hr tablet, TAKE 3 TABLETS (1,500 MG TOTAL) BY MOUTH DAILY WITH BREAKFAST, Disp: 270 tablet, Rfl: 3   metroNIDAZOLE (METROCREAM) 0.75 % cream, , Disp: , Rfl:    pantoprazole (PROTONIX) 40 MG tablet, Take 1 tablet (40 mg total) by mouth daily., Disp: 18090 tablet, Rfl: 1   rosuvastatin (CRESTOR) 5 MG tablet,  TAKE 1 TABLET (5 MG TOTAL) BY MOUTH DAILY., Disp: 90 tablet, Rfl: 1   Sulfacetamide Sodium-Sulfur 9.8-4.8 % LIQD, , Disp: , Rfl:    Objective:     Vitals:   12/31/23 1022  BP: 132/78  Pulse: 64  SpO2: 98%  Weight: 202 lb (91.6 kg)  Height: 5\' 10"  (1.778 m)      Body mass index is 28.98 kg/m.    Physical Exam:    Gen: Appears well, nad, nontoxic and pleasant Psych: Alert and oriented, appropriate mood and affect Neuro: sensation intact, strength is 5/5 with df/pf/inv/ev, muscle tone wnl Skin: no susupicious lesions or rashes  Right ankle:  No deformity, with mild swelling right lower extremity from mid shin and distal compared to left.  No pitting edema.  No erythema, ecchymosis, open lesion TTP mildly lateral malleoli, distal medial tibial shaft NTTP over fibular head,  , medial mal, achilles, navicular, base of 5th, ATFL, CFL, deltoid, calcaneous or midfoot ROM DF 30, PF 45, inv/ev intact Negative ant drawer, talar tilt, rotation test, squeeze test. Neg thompson Medial ankle pain with resisted inversion or eversion    Electronically signed by:  Marshall Skeeter D.[facility] 10:56 AM 12/31/23

## 2024-01-04 ENCOUNTER — Encounter: Payer: Self-pay | Admitting: Sports Medicine

## 2024-01-20 DIAGNOSIS — M9903 Segmental and somatic dysfunction of lumbar region: Secondary | ICD-10-CM | POA: Diagnosis not present

## 2024-01-20 DIAGNOSIS — M9905 Segmental and somatic dysfunction of pelvic region: Secondary | ICD-10-CM | POA: Diagnosis not present

## 2024-01-20 DIAGNOSIS — M9902 Segmental and somatic dysfunction of thoracic region: Secondary | ICD-10-CM | POA: Diagnosis not present

## 2024-01-27 DIAGNOSIS — K219 Gastro-esophageal reflux disease without esophagitis: Secondary | ICD-10-CM | POA: Diagnosis not present

## 2024-01-27 DIAGNOSIS — K449 Diaphragmatic hernia without obstruction or gangrene: Secondary | ICD-10-CM | POA: Diagnosis not present

## 2024-01-27 DIAGNOSIS — K317 Polyp of stomach and duodenum: Secondary | ICD-10-CM | POA: Diagnosis not present

## 2024-01-27 DIAGNOSIS — K2930 Chronic superficial gastritis without bleeding: Secondary | ICD-10-CM | POA: Diagnosis not present

## 2024-01-27 DIAGNOSIS — K2270 Barrett's esophagus without dysplasia: Secondary | ICD-10-CM | POA: Diagnosis not present

## 2024-01-27 NOTE — Progress Notes (Unsigned)
    Johnny [name] D.[facility] [address] [place] [address] Phone: [phone]   Assessment and Plan:     There are no diagnoses linked to this encounter.  ***   Pertinent previous records reviewed include ***    Follow Up: ***     Subjective:   I, Johnny [name], am serving as a Neurosurgeon for Doctor Ulysees Gander   Chief Complaint: right ankle pain    HPI:    12/31/23 Patient is a 73 year old male with right ankle pain. Patient states pain for a couple month of intermittent pain. Intense pain started Monday but has eased off sense then. No MOI. Medial ankle pain with some swelling. Decreased ROM due to swelling. No numbness or tingling. No meds for the pain. Antalgic gait but depends on the day   01/28/2024 Patient states   Relevant Historical Information:   Hypertension, DM type II, Barrett's esophagus, history of prostate cancer  Additional pertinent review of systems negative.   Current Outpatient Medications:    b complex vitamins tablet, Take 1 tablet by mouth daily., Disp: , Rfl:    Cholecalciferol (VITAMIN D3) 50 MCG (2000 UT) capsule, Take by mouth., Disp: , Rfl:    Efinaconazole  (JUBLIA ) 10 % SOLN, Apply daily to affected nails for 48 weeks, Disp: 8 mL, Rfl: 2   empagliflozin  (JARDIANCE ) 25 MG TABS tablet, Take 1 tablet (25 mg total) by mouth daily before breakfast., Disp: 30 tablet, Rfl: 5   hydrochlorothiazide  (HYDRODIURIL ) 25 MG tablet, TAKE 1 TABLET (25 MG TOTAL) BY MOUTH DAILY., Disp: 90 tablet, Rfl: 1   ketoconazole  (NIZORAL ) 2 % cream, APPLY 1 APPLICATION TOPICALLY DAILY, Disp: 30 g, Rfl: 1   losartan  (COZAAR ) 100 MG tablet, TAKE 1 TABLET BY MOUTH EVERY DAY, Disp: 90 tablet, Rfl: 1   MegaRed Omega-3 Krill Oil 500 MG CAPS, as directed Orally, Disp: , Rfl:    meloxicam  (MOBIC ) 15 MG tablet, Take 1 tablet (15 mg total) by mouth daily., Disp: 30 tablet, Rfl: 0   metFORMIN  (GLUCOPHAGE -XR) 500 MG 24 hr tablet, TAKE 3  TABLETS (1,500 MG TOTAL) BY MOUTH DAILY WITH BREAKFAST, Disp: 270 tablet, Rfl: 3   metroNIDAZOLE (METROCREAM) 0.75 % cream, , Disp: , Rfl:    pantoprazole  (PROTONIX ) 40 MG tablet, Take 1 tablet (40 mg total) by mouth daily., Disp: 18090 tablet, Rfl: 1   rosuvastatin  (CRESTOR ) 5 MG tablet, TAKE 1 TABLET (5 MG TOTAL) BY MOUTH DAILY., Disp: 90 tablet, Rfl: 1   Sulfacetamide Sodium-Sulfur 9.8-4.8 % LIQD, , Disp: , Rfl:    Objective:     There were no vitals filed for this visit.    There is no height or weight on file to calculate BMI.    Physical Exam:    ***   Electronically signed by:  Johnny [name] D.[facility] 7:31 AM 01/27/24

## 2024-01-28 ENCOUNTER — Ambulatory Visit: Admitting: Sports Medicine

## 2024-01-28 VITALS — BP 130/78 | HR 72 | Ht 70.0 in | Wt 202.8 lb

## 2024-01-28 DIAGNOSIS — M25571 Pain in right ankle and joints of right foot: Secondary | ICD-10-CM | POA: Diagnosis not present

## 2024-01-28 DIAGNOSIS — G8929 Other chronic pain: Secondary | ICD-10-CM

## 2024-01-28 NOTE — Patient Instructions (Signed)
 Discontinue  meloxicam  use remainder as needed limit 1 time per week  Recommend tylenol for day to day pain relief  Continue HEP  As needed follow up

## 2024-02-04 ENCOUNTER — Ambulatory Visit (INDEPENDENT_AMBULATORY_CARE_PROVIDER_SITE_OTHER)

## 2024-02-04 VITALS — Ht 70.0 in | Wt 200.0 lb

## 2024-02-04 DIAGNOSIS — K317 Polyp of stomach and duodenum: Secondary | ICD-10-CM | POA: Diagnosis not present

## 2024-02-04 DIAGNOSIS — K2270 Barrett's esophagus without dysplasia: Secondary | ICD-10-CM | POA: Diagnosis not present

## 2024-02-04 DIAGNOSIS — Z0000 Encounter for general adult medical examination without abnormal findings: Secondary | ICD-10-CM

## 2024-02-04 NOTE — Patient Instructions (Addendum)
 [name] , Thank you for taking time out of your busy schedule to complete your Annual Wellness Visit with me. I enjoyed our conversation and look forward to speaking with you again next year. I, as well as your care team,  appreciate your ongoing commitment to your health goals. Please review the following plan we discussed and let me know if I can assist you in the future. Your Game plan/ To Do List   Follow up Visits: Next Medicare AWV with our clinical staff: 02/07/2025   Have you seen your provider in the last 6 months (3 months if uncontrolled diabetes)? Yes Next Office Visit with your provider: 06/30/2024  Clinician Recommendations:  Aim for 30 minutes of exercise or brisk walking, 6-8 glasses of water, and 5 servings of fruits and vegetables each day. Educated and advised on getting the Shingles, COVID, and Tdap (Tetenus) vaccines at [facility].      This is a list of the screening recommended for you and due dates:  Health Maintenance  Topic Date Due   Zoster (Shingles) Vaccine (1 of 2) Never done   Complete foot exam   12/26/2023   Eye exam for diabetics  01/20/2024   DTaP/Tdap/Td vaccine (2 - Td or Tdap) 06/28/2024*   Flu Shot  04/15/2024   Hemoglobin A1C  06/29/2024   Yearly kidney function blood test for diabetes  12/28/2024   Yearly kidney health urinalysis for diabetes  12/28/2024   Medicare Annual Wellness Visit  02/03/2025   Colon Cancer Screening  01/10/2026   Pneumonia Vaccine  Completed   Hepatitis C Screening  Completed   HPV Vaccine  Aged Out   Meningitis B Vaccine  Aged Out   COVID-19 Vaccine  Discontinued  *Topic was postponed. The date shown is not the original due date.    Advanced directives: (Copy Requested) Please bring a copy of your health care power of attorney and living will to the office to be added to your chart at your convenience. You can mail to [facility] [address] [place], [place] [address] or email to  [email or web address] Advance Care Planning is important because it:  [x]  Makes sure you receive the medical care that is consistent with your values, goals, and preferences  [x]  It provides guidance to your family and loved ones and reduces their decisional burden about whether or not they are making the right decisions based on your wishes.  Follow the link provided in your after visit summary or read over the paperwork we have mailed to you to help you started getting your Advance Directives in place. If you need assistance in completing these, please reach out to us  so that we can help you!

## 2024-02-04 NOTE — Progress Notes (Signed)
 Subjective:   Johnny [name] is a 73 y.o. who presents for a Medicare Wellness preventive visit.  As a reminder, Annual Wellness Visits don't include a physical exam, and some assessments may be limited, especially if this visit is performed virtually. We may recommend an in-person follow-up visit with your provider if needed.  Visit Complete: Virtual I connected with  Aldona Hunger Creely on 02/04/24 by a audio enabled telemedicine application and verified that I am speaking with the correct person using two identifiers.  Patient Location: Home  Provider Location: Office/Clinic  I discussed the limitations of evaluation and management by telemedicine. The patient expressed understanding and agreed to proceed.  Vital Signs: Because this visit was a virtual/telehealth visit, some criteria may be missing or patient reported. Any vitals not documented were not able to be obtained and vitals that have been documented are patient reported.  VideoDeclined- This patient declined Librarian, academic. Therefore the visit was completed with audio only.  Persons Participating in Visit: Patient.  AWV Questionnaire: No: Patient Medicare AWV questionnaire was not completed prior to this visit.  Cardiac Risk Factors include: advanced age (>38men, >27 women);diabetes mellitus;dyslipidemia;male gender;hypertension     Objective:     Today's Vitals   02/04/24 0810  Weight: 200 lb (90.7 kg)  Height: 5\' 10"  (1.778 m)   Body mass index is 28.7 kg/m.     02/04/2024    8:10 AM 02/20/2023    8:17 AM 02/17/2022    9:24 AM 10/29/2015   12:00 AM 10/28/2015    4:25 PM  Advanced Directives  Does Patient Have a Medical Advance Directive? Yes Yes No Yes No  Type of Estate agent of [place];Living will Living will;Healthcare Power of Attorney  Living will   Does patient want to make changes to medical advance directive?    No - Patient declined   Copy of  Healthcare Power of Attorney in Chart? No - copy requested   Yes   Would patient like information on creating a medical advance directive?   No - Patient declined No - patient declined information No - patient declined information    Current Medications (verified) Outpatient Encounter Medications as of 02/04/2024  Medication Sig   b complex vitamins tablet Take 1 tablet by mouth daily.   Cholecalciferol (VITAMIN D3) 50 MCG (2000 UT) capsule Take by mouth.   Efinaconazole  (JUBLIA ) 10 % SOLN Apply daily to affected nails for 48 weeks   empagliflozin  (JARDIANCE ) 25 MG TABS tablet Take 1 tablet (25 mg total) by mouth daily before breakfast.   hydrochlorothiazide  (HYDRODIURIL ) 25 MG tablet TAKE 1 TABLET (25 MG TOTAL) BY MOUTH DAILY.   ketoconazole  (NIZORAL ) 2 % cream APPLY 1 APPLICATION TOPICALLY DAILY   losartan  (COZAAR ) 100 MG tablet TAKE 1 TABLET BY MOUTH EVERY DAY   MegaRed Omega-3 Krill Oil 500 MG CAPS as directed Orally   meloxicam  (MOBIC ) 15 MG tablet Take 1 tablet (15 mg total) by mouth daily.   metFORMIN  (GLUCOPHAGE -XR) 500 MG 24 hr tablet TAKE 3 TABLETS (1,500 MG TOTAL) BY MOUTH DAILY WITH BREAKFAST   metroNIDAZOLE (METROCREAM) 0.75 % cream    pantoprazole  (PROTONIX ) 40 MG tablet Take 1 tablet (40 mg total) by mouth daily.   rosuvastatin  (CRESTOR ) 5 MG tablet TAKE 1 TABLET (5 MG TOTAL) BY MOUTH DAILY.   Sulfacetamide Sodium-Sulfur 9.8-4.8 % LIQD    No facility-administered encounter medications on file as of 02/04/2024.    Allergies (verified) Patient has  no known allergies.   History: Past Medical History:  Diagnosis Date   Hyperlipidemia    LDL goal = < 110   Hypertension    Prostate cancer [facility]) 2008   Dr Theo First, [facility]   Past Surgical History:  Procedure Laterality Date   CERVICAL FUSION  2007   C5-C7 , Dr Lamon Pillow    COLONOSCOPY  2006   negative; [place], [place]   ESOPHAGOGASTRODUODENOSCOPY Left 10/28/2015   Procedure: ESOPHAGOGASTRODUODENOSCOPY (EGD);  Surgeon: Delilah Fend,  MD;  Location: [facility];  Service: Endoscopy;  Laterality: Left;   PROSTATECTOMY  11/2007   Dr.Moul, [facility]   WRIST SURGERY  1998   [place] bone resection 2006   Family History  Problem Relation Age of Onset   Breast cancer Mother    Alzheimer's disease Mother    Colon cancer Paternal Grandmother    Diabetes Paternal Grandfather    Heart attack Paternal Grandfather 37   Breast cancer Maternal Grandmother    Heart attack Maternal Grandfather 63   Heart disease Father        Atrial fibrillation   Heart failure Father    Stroke Neg Hx    Social History   Socioeconomic History   Marital status: Married    Spouse name: Not on file   Number of children: 3   Years of education: Not on file   Highest education level: Not on file  Occupational History   Occupation: Teacher/Coach  Tobacco Use   Smoking status: Never    Passive exposure: Never   Smokeless tobacco: Never  Substance and Sexual Activity   Alcohol use: No   Drug use: No   Sexual activity: Yes  Other Topics Concern   Not on file  Social History Narrative   Married   Social Drivers of Health   Financial Resource Strain: Low Risk  (02/04/2024)   Overall Financial Resource Strain (CARDIA)    Difficulty of Paying Living Expenses: Not hard at all  Food Insecurity: No Food Insecurity (02/04/2024)   Hunger Vital Sign    Worried About Running Out of Food in the Last Year: Never true    Ran Out of Food in the Last Year: Never true  Transportation Needs: No Transportation Needs (02/04/2024)   PRAPARE - Administrator, Civil Service (Medical): No    Lack of Transportation (Non-Medical): No  Physical Activity: Sufficiently Active (02/04/2024)   Exercise Vital Sign    Days of Exercise per Week: 3 days    Minutes of Exercise per Session: 60 min  Stress: No Stress Concern Present (02/04/2024)   [employer] of Occupational Health - Occupational Stress Questionnaire    Feeling of Stress : Not at all   Social Connections: Socially Integrated (02/04/2024)   Social Connection and Isolation Panel [NHANES]    Frequency of Communication with Friends and Family: More than three times a week    Frequency of Social Gatherings with Friends and Family: More than three times a week    Attends Religious Services: More than 4 times per year    Active Member of [employer] or Organizations: Yes    Attends Engineer, structural: More than 4 times per year    Marital Status: Married    Tobacco Counseling Counseling given: No    Clinical Intake:  Pre-visit preparation completed: Yes  Pain : No/denies pain     BMI - recorded: 28.7 Nutritional Status: BMI 25 -29 Overweight Nutritional Risks: None Diabetes: Yes CBG done?: No  Did pt. bring in CBG monitor from home?: No  Lab Results  Component Value Date   HGBA1C 7.5 (A) 12/29/2023   HGBA1C 7.5 12/29/2023   HGBA1C 7.5 (A) 12/29/2023   HGBA1C 7.5 (A) 12/29/2023     How often do you need to have someone help you when you read instructions, pamphlets, or other written materials from your doctor or pharmacy?: 1 - Never  Interpreter Needed?: No  Information entered by :: Kandy Orris, CMA   Activities of Daily Living     02/04/2024    8:13 AM 02/20/2023    8:26 AM  In your present state of health, do you have any difficulty performing the following activities:  Hearing? 0   Vision? 0   Difficulty concentrating or making decisions? 0   Walking or climbing stairs? 0   Dressing or bathing? 0   Doing errands, shopping? 0   Preparing Food and eating ? N N  Using the Toilet? N N  In the past six months, have you accidently leaked urine? N N  Do you have problems with loss of bowel control? N N  Managing your Medications? N N  Managing your Finances? N N  Housekeeping or managing your Housekeeping? N N    Patient Care Team: Colene Dauphin, MD as PCP - General (Internal Medicine) Bayard Limbo, OD (Optometry) Evangeline Hilts, MD  as Consulting Physician (Gastroenterology)  Indicate any recent Medical Services you may have received from other than [facility] providers in the past year (date may be approximate).     Assessment:    This is a routine wellness examination for [employer].  Hearing/Vision screen Hearing Screening - Comments:: Denies hearing difficulties   Vision Screening - Comments:: Wears rx glasses - up to date with routine eye exams with Dr Bayard Limbo   Goals Addressed               This Visit's Progress     Patient Stated (pt-stated)        Patient stated he plans to stay active  (going to the gym, play golf) and try eating better.       Depression Screen     02/04/2024    8:15 AM 12/29/2023    9:15 AM 02/20/2023    8:17 AM 12/26/2022    8:20 AM 02/24/2022    9:53 AM 02/17/2022    9:23 AM 11/12/2020    8:08 AM  PHQ 2/9 Scores  PHQ - 2 Score 0 0 0 0 0 0 0  PHQ- 9 Score 0   0 0 0     Fall Risk     02/04/2024    8:14 AM 12/29/2023    9:14 AM 02/20/2023    8:17 AM 12/26/2022    8:20 AM 02/24/2022    9:52 AM  Fall Risk   Falls in the past year? 0 0 0 0 0  Number falls in past yr: 0 0 0 0 0  Injury with Fall? 0 0 0 0 0  Risk for fall due to : No Fall Risks No Fall Risks No Fall Risks No Fall Risks No Fall Risks  Follow up Falls evaluation completed;Falls prevention discussed Falls evaluation completed Falls evaluation completed Falls evaluation completed Falls evaluation completed    MEDICARE RISK AT HOME:  Medicare Risk at Home Any stairs in or around the home?: Yes If so, are there any without handrails?: No Home free of loose throw rugs in walkways, pet beds,  electrical cords, etc?: Yes Adequate lighting in your home to reduce risk of falls?: Yes Life alert?: No Use of a cane, walker or w/c?: No Grab bars in the bathroom?: Yes Shower chair or bench in shower?: Yes Elevated toilet seat or a handicapped toilet?: No  TIMED UP AND GO:  Was the test performed?  No  Cognitive Function:  6CIT completed        02/04/2024    8:19 AM 02/20/2023    8:28 AM 02/17/2022    9:29 AM  6CIT Screen  What Year? 0 points 0 points 0 points  What month? 0 points 0 points 0 points  What time? 0 points 0 points 0 points  Count back from 20 0 points 0 points 0 points  Months in reverse 0 points 0 points 0 points  Repeat phrase 0 points 0 points 0 points  Total Score 0 points 0 points 0 points    Immunizations Immunization History  Administered Date(s) Administered   Influenza, High Dose Seasonal PF 06/20/2016, 06/26/2017, 06/18/2018   Pneumococcal Conjugate-13 09/25/2016   Pneumococcal Polysaccharide-23 12/25/2017   Tdap 02/03/2012    Screening Tests Health Maintenance  Topic Date Due   Zoster Vaccines- Shingrix (1 of 2) Never done   FOOT EXAM  12/26/2023   OPHTHALMOLOGY EXAM  01/20/2024   DTaP/Tdap/Td (2 - Td or Tdap) 06/28/2024 (Originally 02/02/2022)   INFLUENZA VACCINE  04/15/2024   HEMOGLOBIN A1C  06/29/2024   Diabetic kidney evaluation - eGFR measurement  12/28/2024   Diabetic kidney evaluation - Urine ACR  12/28/2024   Medicare Annual Wellness (AWV)  02/03/2025   Colonoscopy  01/10/2026   Pneumonia Vaccine 25+ Years old  Completed   Hepatitis C Screening  Completed   HPV VACCINES  Aged Out   Meningococcal B Vaccine  Aged Out   COVID-19 Vaccine  Discontinued    Health Maintenance  Health Maintenance Due  Topic Date Due   Zoster Vaccines- Shingrix (1 of 2) Never done   FOOT EXAM  12/26/2023   OPHTHALMOLOGY EXAM  01/20/2024   Health Maintenance Items Addressed: 02/04/2024   Additional Screening:  Vision Screening: Recommended annual ophthalmology exams for early detection of glaucoma and other disorders of the eye.  Dental Screening: Recommended annual dental exams for proper oral hygiene  Community Resource Referral / Chronic Care Management: CRR required this visit?  No   CCM required this visit?  No   Plan:    I have personally reviewed and noted  the following in the patient's chart:   Medical and social history Use of alcohol, tobacco or illicit drugs  Current medications and supplements including opioid prescriptions. Patient is not currently taking opioid prescriptions. Functional ability and status Nutritional status Physical activity Advanced directives List of other physicians Hospitalizations, surgeries, and ER visits in previous 12 months Vitals Screenings to include cognitive, depression, and falls Referrals and appointments  In addition, I have reviewed and discussed with patient certain preventive protocols, quality metrics, and best practice recommendations. A written personalized care plan for preventive services as well as general preventive health recommendations were provided to patient.   Patria Bookbinder, CMA   02/04/2024   After Visit Summary: (MyChart) Due to this being a telephonic visit, the after visit summary with patients personalized plan was offered to patient via MyChart   Notes: Nothing significant to report at this time.

## 2024-02-17 ENCOUNTER — Encounter: Payer: Self-pay | Admitting: Internal Medicine

## 2024-02-17 DIAGNOSIS — M9905 Segmental and somatic dysfunction of pelvic region: Secondary | ICD-10-CM | POA: Diagnosis not present

## 2024-02-17 DIAGNOSIS — M9903 Segmental and somatic dysfunction of lumbar region: Secondary | ICD-10-CM | POA: Diagnosis not present

## 2024-02-17 DIAGNOSIS — M9902 Segmental and somatic dysfunction of thoracic region: Secondary | ICD-10-CM | POA: Diagnosis not present

## 2024-03-09 ENCOUNTER — Other Ambulatory Visit: Payer: Self-pay | Admitting: Internal Medicine

## 2024-03-16 DIAGNOSIS — M9905 Segmental and somatic dysfunction of pelvic region: Secondary | ICD-10-CM | POA: Diagnosis not present

## 2024-03-16 DIAGNOSIS — M9903 Segmental and somatic dysfunction of lumbar region: Secondary | ICD-10-CM | POA: Diagnosis not present

## 2024-03-16 DIAGNOSIS — M9902 Segmental and somatic dysfunction of thoracic region: Secondary | ICD-10-CM | POA: Diagnosis not present

## 2024-04-13 DIAGNOSIS — M9902 Segmental and somatic dysfunction of thoracic region: Secondary | ICD-10-CM | POA: Diagnosis not present

## 2024-04-13 DIAGNOSIS — M9905 Segmental and somatic dysfunction of pelvic region: Secondary | ICD-10-CM | POA: Diagnosis not present

## 2024-04-13 DIAGNOSIS — M9903 Segmental and somatic dysfunction of lumbar region: Secondary | ICD-10-CM | POA: Diagnosis not present

## 2024-05-11 DIAGNOSIS — M9905 Segmental and somatic dysfunction of pelvic region: Secondary | ICD-10-CM | POA: Diagnosis not present

## 2024-05-11 DIAGNOSIS — M9902 Segmental and somatic dysfunction of thoracic region: Secondary | ICD-10-CM | POA: Diagnosis not present

## 2024-05-11 DIAGNOSIS — M9903 Segmental and somatic dysfunction of lumbar region: Secondary | ICD-10-CM | POA: Diagnosis not present

## 2024-05-24 DIAGNOSIS — R9721 Rising PSA following treatment for malignant neoplasm of prostate: Secondary | ICD-10-CM | POA: Diagnosis not present

## 2024-05-24 DIAGNOSIS — N5203 Combined arterial insufficiency and corporo-venous occlusive erectile dysfunction: Secondary | ICD-10-CM | POA: Diagnosis not present

## 2024-05-24 DIAGNOSIS — K4090 Unilateral inguinal hernia, without obstruction or gangrene, not specified as recurrent: Secondary | ICD-10-CM | POA: Diagnosis not present

## 2024-05-24 DIAGNOSIS — C61 Malignant neoplasm of prostate: Secondary | ICD-10-CM | POA: Diagnosis not present

## 2024-06-19 ENCOUNTER — Other Ambulatory Visit: Payer: Self-pay | Admitting: Internal Medicine

## 2024-06-20 NOTE — Progress Notes (Unsigned)
 [name] [address] [place] [address] Phone: [phone] Subjective:   ISusannah Gully, am serving as a scribe for Dr. Arthea Claudene.  I'm seeing this patient by the request  of:  Geofm Glade PARAS, MD  CC: Right thumb pain  YEP:Dlagzrupcz  05/22/2023 Chronic problem, given injection today, discussed icing regimen and home exercise increase activity slowly.  Follow-up again in 6 to 12 months     Updated 06/21/2024 SAHITH NURSE is a 73 y.o. male coming in with complaint of R thumb pain, known arthritic changes.  Last time saw patient was in May by another provider for right ankle pain.  Reviewing patient's chart has been treated recently for malignant neoplasm of the prostate.  Patient states thumb doing a bit better, but 3rd finger feeling tight.       Past Medical History:  Diagnosis Date   Hyperlipidemia    LDL goal = < 110   Hypertension    Prostate cancer [facility]) 2008   Dr Charles, [facility]   Past Surgical History:  Procedure Laterality Date   CERVICAL FUSION  2007   C5-C7 , Dr Onetha    COLONOSCOPY  2006   negative; [place], [place]   ESOPHAGOGASTRODUODENOSCOPY Left 10/28/2015   Procedure: ESOPHAGOGASTRODUODENOSCOPY (EGD);  Surgeon: Norleen Hint, MD;  Location: [facility];  Service: Endoscopy;  Laterality: Left;   PROSTATECTOMY  11/2007   Dr.Moul, [facility]   WRIST SURGERY  1998   [place] bone resection 2006   Social History   Socioeconomic History   Marital status: Married    Spouse name: Not on file   Number of children: 3   Years of education: Not on file   Highest education level: Not on file  Occupational History   Occupation: Teacher/Coach  Tobacco Use   Smoking status: Never    Passive exposure: Never   Smokeless tobacco: Never  Substance and Sexual Activity   Alcohol use: No   Drug use: No   Sexual activity: Yes  Other Topics Concern   Not on file  Social History Narrative   Married   Social Drivers of Health    Financial Resource Strain: Low Risk  (02/04/2024)   Overall Financial Resource Strain (CARDIA)    Difficulty of Paying Living Expenses: Not hard at all  Food Insecurity: No Food Insecurity (02/04/2024)   Hunger Vital Sign    Worried About Running Out of Food in the Last Year: Never true    Ran Out of Food in the Last Year: Never true  Transportation Needs: No Transportation Needs (02/04/2024)   PRAPARE - Administrator, Civil Service (Medical): No    Lack of Transportation (Non-Medical): No  Physical Activity: Sufficiently Active (02/04/2024)   Exercise Vital Sign    Days of Exercise per Week: 3 days    Minutes of Exercise per Session: 60 min  Stress: No Stress Concern Present (02/04/2024)   [employer] of Occupational Health - Occupational Stress Questionnaire    Feeling of Stress : Not at all  Social Connections: Socially Integrated (02/04/2024)   Social Connection and Isolation Panel    Frequency of Communication with Friends and Family: More than three times a week    Frequency of Social Gatherings with Friends and Family: More than three times a week    Attends Religious Services: More than 4 times per year    Active Member of [employer] or Organizations: Yes    Attends Club or  Organization Meetings: More than 4 times per year    Marital Status: Married   No Known Allergies Family History  Problem Relation Age of Onset   Breast cancer Mother    Alzheimer's disease Mother    Colon cancer Paternal Grandmother    Diabetes Paternal Grandfather    Heart attack Paternal Grandfather 71   Breast cancer Maternal Grandmother    Heart attack Maternal Grandfather 72   Heart disease Father        Atrial fibrillation   Heart failure Father    Stroke Neg Hx     Current Outpatient Medications (Endocrine & Metabolic):    JARDIANCE  25 MG TABS tablet, TAKE 1 TABLET BY MOUTH DAILY BEFORE BREAKFAST.   metFORMIN  (GLUCOPHAGE -XR) 500 MG 24 hr tablet, TAKE 3 TABLETS (1,500 MG  TOTAL) BY MOUTH DAILY WITH BREAKFAST  Current Outpatient Medications (Cardiovascular):    hydrochlorothiazide  (HYDRODIURIL ) 25 MG tablet, TAKE 1 TABLET (25 MG TOTAL) BY MOUTH DAILY.   losartan  (COZAAR ) 100 MG tablet, TAKE 1 TABLET BY MOUTH EVERY DAY   rosuvastatin  (CRESTOR ) 5 MG tablet, TAKE 1 TABLET (5 MG TOTAL) BY MOUTH DAILY.   Current Outpatient Medications (Analgesics):    meloxicam  (MOBIC ) 15 MG tablet, Take 1 tablet (15 mg total) by mouth daily.   Current Outpatient Medications (Other):    b complex vitamins tablet, Take 1 tablet by mouth daily.   Cholecalciferol (VITAMIN D3) 50 MCG (2000 UT) capsule, Take by mouth.   Efinaconazole  (JUBLIA ) 10 % SOLN, Apply daily to affected nails for 48 weeks   ketoconazole  (NIZORAL ) 2 % cream, APPLY 1 APPLICATION TOPICALLY DAILY   MegaRed Omega-3 Krill Oil 500 MG CAPS, as directed Orally   metroNIDAZOLE (METROCREAM) 0.75 % cream,    pantoprazole  (PROTONIX ) 40 MG tablet, Take 1 tablet (40 mg total) by mouth daily.   Sulfacetamide Sodium-Sulfur 9.8-4.8 % LIQD,    Reviewed prior external information including notes and imaging from  primary care provider As well as notes that were available from care everywhere and other healthcare systems.  Past medical history, social, surgical and family history all reviewed in electronic medical record.  No pertanent information unless stated regarding to the chief complaint.   Review of Systems:  No headache, visual changes, nausea, vomiting, diarrhea, constipation, dizziness, abdominal pain, skin rash, fevers, chills, night sweats, weight loss, swollen lymph nodes, body aches, joint swelling, chest pain, shortness of breath, mood changes. POSITIVE muscle aches  Objective  Blood pressure 126/74, pulse 81, height 5' 10 (1.778 m), weight 199 lb (90.3 kg), SpO2 98%.   General: No apparent distress alert and oriented x3 mood and affect normal, dressed appropriately.  HEENT: Pupils equal, extraocular  movements intact  Respiratory: Patient's speak in full sentences and does not appear short of breath  Cardiovascular: No lower extremity edema, non tender, no erythema  Right thumb exam shows patient does have some arthritic changes noted.  The third MCP joint though is tender to palpation noted.  Good range of motion though noted.  May be mild hypertrophic compared to the contralateral side though.   Limited muscular skeletal ultrasound was performed and interpreted by CLAUDENE HUSSAR, M  Limited ultrasound shows a patient's third MCP and does not have any significant swelling of the joint but unfortunately does have a cortical irregularity consistent with a healing fracture.  Seems that this was a potential displaced fracture initially. Impression: Healing third MCP fracture   Impression and Recommendations:    The above documentation has  been reviewed and is accurate and complete Jairy Angulo M Dayvian Blixt, DO

## 2024-06-21 ENCOUNTER — Other Ambulatory Visit: Payer: Self-pay

## 2024-06-21 ENCOUNTER — Ambulatory Visit

## 2024-06-21 ENCOUNTER — Encounter: Payer: Self-pay | Admitting: Family Medicine

## 2024-06-21 ENCOUNTER — Ambulatory Visit: Admitting: Family Medicine

## 2024-06-21 VITALS — BP 126/74 | HR 81 | Ht 70.0 in | Wt 199.0 lb

## 2024-06-21 DIAGNOSIS — M1811 Unilateral primary osteoarthritis of first carpometacarpal joint, right hand: Secondary | ICD-10-CM | POA: Diagnosis not present

## 2024-06-21 DIAGNOSIS — S62642A Nondisplaced fracture of proximal phalanx of right middle finger, initial encounter for closed fracture: Secondary | ICD-10-CM | POA: Diagnosis not present

## 2024-06-21 DIAGNOSIS — M79644 Pain in right finger(s): Secondary | ICD-10-CM

## 2024-06-21 DIAGNOSIS — S62609A Fracture of unspecified phalanx of unspecified finger, initial encounter for closed fracture: Secondary | ICD-10-CM | POA: Insufficient documentation

## 2024-06-21 DIAGNOSIS — M79641 Pain in right hand: Secondary | ICD-10-CM | POA: Diagnosis not present

## 2024-06-21 NOTE — Patient Instructions (Signed)
 Xray today Buddy tape for 2 weeks daily See you again in 6 weeks

## 2024-06-21 NOTE — Assessment & Plan Note (Signed)
 Believe it is in a process of healing.  Discussed icing regimen and home exercises, discussed which activities to do and which ones to avoid.  Increase activity slowly.  Discussed icing regimen.  Buddy taping, will get x-rays.  Likely will heal but will take some time.  Follow-up again in 6 to 8 weeks

## 2024-06-25 ENCOUNTER — Other Ambulatory Visit: Payer: Self-pay | Admitting: Internal Medicine

## 2024-06-25 DIAGNOSIS — I10 Essential (primary) hypertension: Secondary | ICD-10-CM

## 2024-06-27 ENCOUNTER — Ambulatory Visit: Payer: Self-pay | Admitting: Family Medicine

## 2024-06-28 DIAGNOSIS — M9902 Segmental and somatic dysfunction of thoracic region: Secondary | ICD-10-CM | POA: Diagnosis not present

## 2024-06-28 DIAGNOSIS — M9903 Segmental and somatic dysfunction of lumbar region: Secondary | ICD-10-CM | POA: Diagnosis not present

## 2024-06-28 DIAGNOSIS — M9905 Segmental and somatic dysfunction of pelvic region: Secondary | ICD-10-CM | POA: Diagnosis not present

## 2024-06-29 ENCOUNTER — Encounter: Payer: Self-pay | Admitting: Internal Medicine

## 2024-06-29 NOTE — Progress Notes (Unsigned)
 Subjective:    Patient ID: Johnny [name], male    DOB: 1951/04/25, 74 y.o.   MRN: [id number]     HPI Johnny [name] is here for follow up of his chronic medical problems.  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  Yesterday had > 10,000 steps.    Overall doing well-no concerns  Medications and allergies reviewed with patient and updated if appropriate.  Current Outpatient Medications on File Prior to Visit  Medication Sig Dispense Refill   b complex vitamins tablet Take 1 tablet by mouth daily.     Cholecalciferol (VITAMIN D3) 50 MCG (2000 UT) capsule Take by mouth.     Efinaconazole  (JUBLIA ) 10 % SOLN Apply daily to affected nails for 48 weeks 8 mL 2   hydrochlorothiazide  (HYDRODIURIL ) 25 MG tablet TAKE 1 TABLET (25 MG TOTAL) BY MOUTH DAILY. 90 tablet 1   JARDIANCE  25 MG TABS tablet TAKE 1 TABLET BY MOUTH DAILY BEFORE BREAKFAST. 30 tablet 5   ketoconazole  (NIZORAL ) 2 % cream APPLY 1 APPLICATION TOPICALLY DAILY 30 g 1   losartan  (COZAAR ) 100 MG tablet TAKE 1 TABLET BY MOUTH EVERY DAY 90 tablet 1   MegaRed Omega-3 Krill Oil 500 MG CAPS as directed Orally     meloxicam  (MOBIC ) 15 MG tablet Take 1 tablet (15 mg total) by mouth daily. 30 tablet 0   metFORMIN  (GLUCOPHAGE -XR) 500 MG 24 hr tablet TAKE 3 TABLETS (1,500 MG TOTAL) BY MOUTH DAILY WITH BREAKFAST 270 tablet 3   metroNIDAZOLE (METROCREAM) 0.75 % cream      pantoprazole  (PROTONIX ) 40 MG tablet Take 1 tablet (40 mg total) by mouth daily. 18090 tablet 1   rosuvastatin  (CRESTOR ) 5 MG tablet TAKE 1 TABLET (5 MG TOTAL) BY MOUTH DAILY. 90 tablet 1   Sulfacetamide Sodium-Sulfur 9.8-4.8 % LIQD      No current facility-administered medications on file prior to visit.     Review of Systems  Constitutional:  Negative for fever.  HENT:  Negative for trouble swallowing.   Respiratory:  Negative for cough, shortness of breath and wheezing.   Cardiovascular:  Negative for chest pain, palpitations and leg swelling.   Gastrointestinal:  Negative for abdominal pain.       Rare gerd  Neurological:  Negative for light-headedness and headaches.       Objective:   Vitals:   06/30/24 0954  BP: 122/74  Pulse: 60  Temp: 98 F (36.7 C)  SpO2: 97%   BP Readings from Last 3 Encounters:  06/30/24 122/74  06/21/24 126/74  01/28/24 130/78   Wt Readings from Last 3 Encounters:  06/30/24 197 lb (89.4 kg)  06/21/24 199 lb (90.3 kg)  02/04/24 200 lb (90.7 kg)   Body mass index is 28.27 kg/m.    Physical Exam Constitutional:      General: He is not in acute distress.    Appearance: Normal appearance. He is not ill-appearing.  HENT:     Head: Normocephalic and atraumatic.  Eyes:     Conjunctiva/sclera: Conjunctivae normal.  Cardiovascular:     Rate and Rhythm: Normal rate and regular rhythm.     Heart sounds: Normal heart sounds.  Pulmonary:     Effort: Pulmonary effort is normal. No respiratory distress.     Breath sounds: Normal breath sounds. No wheezing or rales.  Musculoskeletal:     Right lower leg: No edema.     Left lower leg: No edema.  Skin:  General: Skin is warm and dry.     Findings: No rash.  Neurological:     Mental Status: He is alert. Mental status is at baseline.  Psychiatric:        Mood and Affect: Mood normal.        Lab Results  Component Value Date   WBC 7.9 12/29/2023   HGB 16.6 12/29/2023   HCT 50.0 12/29/2023   PLT 318.0 12/29/2023   GLUCOSE 154 (H) 12/29/2023   CHOL 125 12/29/2023   TRIG 120.0 12/29/2023   HDL 45.30 12/29/2023   LDLDIRECT 103.0 06/26/2017   LDLCALC 56 12/29/2023   ALT 36 12/29/2023   AST 28 12/29/2023   NA 135 12/29/2023   K 3.9 12/29/2023   CL 100 12/29/2023   CREATININE 0.85 12/29/2023   BUN 14 12/29/2023   CO2 26 12/29/2023   TSH 1.33 12/26/2022   PSA 1.13 12/26/2022   INR 1.16 10/28/2015   HGBA1C 7.5 (A) 12/29/2023   HGBA1C 7.5 12/29/2023   HGBA1C 7.5 (A) 12/29/2023   HGBA1C 7.5 (A) 12/29/2023   MICROALBUR 1.0  12/29/2023     Assessment & Plan:    See Problem List for Assessment and Plan of chronic medical problems.

## 2024-06-29 NOTE — Assessment & Plan Note (Signed)
 S/p prostatectomy, Lupron therapy, radiation Rising PSA-monitoring Following with oncology and urology

## 2024-06-29 NOTE — Patient Instructions (Addendum)
     Medications changes include :   None     Return in about 6 months (around 12/29/2024) for Physical Exam.

## 2024-06-30 ENCOUNTER — Ambulatory Visit: Admitting: Internal Medicine

## 2024-06-30 VITALS — BP 122/74 | HR 60 | Temp 98.0°F | Ht 70.0 in | Wt 197.0 lb

## 2024-06-30 DIAGNOSIS — E1169 Type 2 diabetes mellitus with other specified complication: Secondary | ICD-10-CM | POA: Diagnosis not present

## 2024-06-30 DIAGNOSIS — I152 Hypertension secondary to endocrine disorders: Secondary | ICD-10-CM

## 2024-06-30 DIAGNOSIS — K2270 Barrett's esophagus without dysplasia: Secondary | ICD-10-CM

## 2024-06-30 DIAGNOSIS — E785 Hyperlipidemia, unspecified: Secondary | ICD-10-CM | POA: Diagnosis not present

## 2024-06-30 DIAGNOSIS — Z8546 Personal history of malignant neoplasm of prostate: Secondary | ICD-10-CM | POA: Diagnosis not present

## 2024-06-30 DIAGNOSIS — E1159 Type 2 diabetes mellitus with other circulatory complications: Secondary | ICD-10-CM | POA: Diagnosis not present

## 2024-06-30 DIAGNOSIS — B351 Tinea unguium: Secondary | ICD-10-CM | POA: Diagnosis not present

## 2024-06-30 LAB — COMPREHENSIVE METABOLIC PANEL WITH GFR
ALT: 31 U/L (ref 0–53)
AST: 27 U/L (ref 0–37)
Albumin: 4.5 g/dL (ref 3.5–5.2)
Alkaline Phosphatase: 58 U/L (ref 39–117)
BUN: 20 mg/dL (ref 6–23)
CO2: 26 meq/L (ref 19–32)
Calcium: 9.8 mg/dL (ref 8.4–10.5)
Chloride: 101 meq/L (ref 96–112)
Creatinine, Ser: 0.91 mg/dL (ref 0.40–1.50)
GFR: 83.76 mL/min (ref >60.00)
Glucose, Bld: 128 mg/dL — ABNORMAL HIGH (ref 70–99)
Potassium: 3.8 meq/L (ref 3.5–5.1)
Sodium: 137 meq/L (ref 135–145)
Total Bilirubin: 1.1 mg/dL (ref 0.2–1.2)
Total Protein: 7.6 g/dL (ref 6.0–8.3)

## 2024-06-30 LAB — LIPID PANEL
Cholesterol: 130 mg/dL (ref 0–200)
HDL: 44.7 mg/dL (ref >39.00)
LDL Cholesterol: 60 mg/dL (ref 0–99)
NonHDL: 85.03
Total CHOL/HDL Ratio: 3
Triglycerides: 124 mg/dL (ref 0.0–149.0)
VLDL: 24.8 mg/dL (ref 0.0–40.0)

## 2024-06-30 LAB — HEMOGLOBIN A1C: Hgb A1c MFr Bld: 7.6 % — ABNORMAL HIGH (ref 4.6–6.5)

## 2024-06-30 NOTE — Assessment & Plan Note (Signed)
 Chronic Associated with hyperlipidemia, hyperglycemia  Lab Results  Component Value Date   HGBA1C 7.5 (A) 12/29/2023   HGBA1C 7.5 12/29/2023   HGBA1C 7.5 (A) 12/29/2023   HGBA1C 7.5 (A) 12/29/2023   A1c today Sugars not ideally controlled He does not check his sugars Continue Jardiance  25 mg daily, metformin  1500 mg xr daily Stressed regular exercise, diabetic diet

## 2024-06-30 NOTE — Assessment & Plan Note (Signed)
 Chronic H/o Barrett's esophagus Continue pantoprazole  40 mg daily Denies GERD, dysphagia Follows with GI yearly

## 2024-06-30 NOTE — Assessment & Plan Note (Signed)
Chronic BP well controlled Continue hctz 25 mg daily, losartan 100 mg daily cmp

## 2024-06-30 NOTE — Assessment & Plan Note (Signed)
 Chronic Improved significantly with ketoconazole  2% cream

## 2024-06-30 NOTE — Assessment & Plan Note (Signed)
 Chronic Check lipid panel, cmp Continue crestor  5 mg daily Regular exercise and healthy diet encouraged  Lab Results  Component Value Date   LDLCALC 56 12/29/2023

## 2024-07-01 ENCOUNTER — Ambulatory Visit: Payer: Self-pay | Admitting: Internal Medicine

## 2024-07-01 MED ORDER — GLIMEPIRIDE 4 MG PO TABS: 4.0000 mg | ORAL_TABLET | Freq: Every day | ORAL | 1 refills | Status: AC

## 2024-07-05 DIAGNOSIS — L578 Other skin changes due to chronic exposure to nonionizing radiation: Secondary | ICD-10-CM | POA: Diagnosis not present

## 2024-07-05 DIAGNOSIS — L821 Other seborrheic keratosis: Secondary | ICD-10-CM | POA: Diagnosis not present

## 2024-07-05 DIAGNOSIS — D0439 Carcinoma in situ of skin of other parts of face: Secondary | ICD-10-CM | POA: Diagnosis not present

## 2024-07-05 DIAGNOSIS — L570 Actinic keratosis: Secondary | ICD-10-CM | POA: Diagnosis not present

## 2024-07-26 DIAGNOSIS — M9903 Segmental and somatic dysfunction of lumbar region: Secondary | ICD-10-CM | POA: Diagnosis not present

## 2024-07-26 DIAGNOSIS — M9905 Segmental and somatic dysfunction of pelvic region: Secondary | ICD-10-CM | POA: Diagnosis not present

## 2024-07-26 DIAGNOSIS — M9902 Segmental and somatic dysfunction of thoracic region: Secondary | ICD-10-CM | POA: Diagnosis not present

## 2024-08-05 NOTE — Progress Notes (Unsigned)
 [name] [address] [place] [address] Phone: [phone] Subjective:   [name], am serving as a scribe for Dr. Arthea [name].  I'm seeing this patient by the request  of:  Johnny Glade PARAS, MD  CC: Finger pain follow-up, new foot pain  YEP:Dlagzrupcz  06/21/2024 Believe it is in a process of healing.  Discussed icing regimen and home exercises, discussed which activities to do and which ones to avoid.  Increase activity slowly.  Discussed icing regimen.  Buddy taping, will get x-rays.  Likely will heal but will take some time.  Follow-up again in 6 to 8 weeks     Updated 08/10/2024 [name] [name] is a 73 y.o. male coming in with complaint of finger f/x. Finger is doing a little better, but not 100%. R foot pain over base of the fifth. 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 Pain has been on and off for the last couple of months. Getting progressively worse.   Hand x-ray October 12 showed some degenerative changes noted.  Ultrasound showed that there was a healing third MCP fracture.    Past Medical History:  Diagnosis Date   Hyperlipidemia    LDL goal = < 110   Hypertension    Prostate cancer [facility]) 2008   Dr [name], [facility]   Past Surgical History:  Procedure Laterality Date   CERVICAL FUSION  2007   C5-C7 , Dr [name]    COLONOSCOPY  2006   negative; [place], [place]   ESOPHAGOGASTRODUODENOSCOPY Left 10/28/2015   Procedure: ESOPHAGOGASTRODUODENOSCOPY (EGD);  Surgeon: [name] Hint, MD;  [name];  Service: Endoscopy;  Laterality: Left;   PROSTATECTOMY  11/2007   Dr.Moul, [facility]   WRIST SURGERY  1998   [name] bone resection 2006   Social History   Socioeconomic History   Marital status: Married    Spouse name: Not on file   Number of children: 3   Years of education: Not on file   Highest education level: Not on file  Occupational History   Occupation: Teacher/Coach  Tobacco Use   Smoking status: Never     Passive exposure: Never   Smokeless tobacco: Never  Substance and Sexual Activity   Alcohol use: No   Drug use: No   Sexual activity: Yes  Other Topics Concern   Not on file  Social History Narrative   Married   Social Drivers of Health   Financial Resource Strain: Low Risk  (02/04/2024)   Overall Financial Resource Strain (CARDIA)    Difficulty of Paying Living Expenses: Not hard at all  Food Insecurity: No Food Insecurity (02/04/2024)   Hunger Vital Sign    Worried About Running Out of Food in the Last Year: Never true    Ran Out of Food in the Last Year: Never true  Transportation Needs: No Transportation Needs (02/04/2024)   PRAPARE - Administrator, Civil Service (Medical): No    Lack of Transportation (Non-Medical): No  Physical Activity: Sufficiently Active (02/04/2024)   Exercise Vital Sign    Days of Exercise per Week: 3 days    Minutes of Exercise per Session: 60 min  Stress: No Stress Concern Present (02/04/2024)   [employer] of Occupational Health - Occupational Stress Questionnaire    Feeling of Stress : Not at all  Social Connections: Socially Integrated (02/04/2024)   Social Connection and Isolation Panel    Frequency of Communication with Friends and Family: More than  three times a week    Frequency of Social Gatherings with Friends and Family: More than three times a week    Attends Religious Services: More than 4 times per year    Active Member of [employer] or Organizations: Yes    Attends Engineer, Structural: More than 4 times per year    Marital Status: Married   No Known Allergies Family History  Problem Relation Age of Onset   Breast cancer Mother    Alzheimer's disease Mother    Colon cancer Paternal Grandmother    Diabetes Paternal Grandfather    Heart attack Paternal Grandfather 12   Breast cancer Maternal Grandmother    Heart attack Maternal Grandfather 72   Heart disease Father        Atrial fibrillation   Heart failure  Father    Stroke Neg Hx     Current Outpatient Medications (Endocrine & Metabolic):    glimepiride (AMARYL) 4 MG tablet, Take 1 tablet (4 mg total) by mouth daily before breakfast.   JARDIANCE  25 MG TABS tablet, TAKE 1 TABLET BY MOUTH DAILY BEFORE BREAKFAST.   metFORMIN  (GLUCOPHAGE -XR) 500 MG 24 hr tablet, TAKE 3 TABLETS (1,500 MG TOTAL) BY MOUTH DAILY WITH BREAKFAST  Current Outpatient Medications (Cardiovascular):    hydrochlorothiazide  (HYDRODIURIL ) 25 MG tablet, TAKE 1 TABLET (25 MG TOTAL) BY MOUTH DAILY.   losartan  (COZAAR ) 100 MG tablet, TAKE 1 TABLET BY MOUTH EVERY DAY   rosuvastatin  (CRESTOR ) 5 MG tablet, TAKE 1 TABLET (5 MG TOTAL) BY MOUTH DAILY.   Current Outpatient Medications (Analgesics):    meloxicam  (MOBIC ) 15 MG tablet, Take 1 tablet (15 mg total) by mouth daily.   Current Outpatient Medications (Other):    doxycycline  (VIBRA -TABS) 100 MG tablet, Take 1 tablet (100 mg total) by mouth 2 (two) times daily.   metroNIDAZOLE (METROCREAM) 0.75 % cream,    b complex vitamins tablet, Take 1 tablet by mouth daily.   Cholecalciferol (VITAMIN D3) 50 MCG (2000 UT) capsule, Take by mouth.   ketoconazole  (NIZORAL ) 2 % cream, APPLY 1 APPLICATION TOPICALLY DAILY   MegaRed Omega-3 Krill Oil 500 MG CAPS, as directed Orally   pantoprazole  (PROTONIX ) 40 MG tablet, Take 1 tablet (40 mg total) by mouth daily.   Sulfacetamide Sodium-Sulfur 9.8-4.8 % LIQD,    Reviewed prior external information including notes and imaging from  primary care provider As well as notes that were available from care everywhere and other healthcare systems.  Past medical history, social, surgical and family history all reviewed in electronic medical record.  No pertanent information unless stated regarding to the chief complaint.   Review of Systems:  No headache, visual changes, nausea, vomiting, diarrhea, constipation, dizziness, abdominal pain, skin rash, fevers, chills, night sweats, weight loss, swollen  lymph nodes, body aches, joint swelling, chest pain, shortness of breath, mood changes. POSITIVE muscle aches  Objective  Blood pressure 126/84, pulse 68, height 5' 10 (1.778 m), weight 205 lb (93 kg), SpO2 98%.   General: No apparent distress alert and oriented x3 mood and affect normal, dressed appropriately.  HEENT: Pupils equal, extraocular movements intact  Respiratory: Patient's speak in full sentences and does not appear short of breath  Cardiovascular: No lower extremity edema, non tender, no erythema  Hand exam shows patient's finger seems to be doing well.  Full range of motion.  Third met at carpal seems to be not as severe. Patient's right foot really discomfort noted at the interphalangeal joint of the fifth toe.  No pain proximally on the metatarsal.  Limited muscular skeletal ultrasound was performed and interpreted by [name] [name], M  Limited ultrasound of patient's right foot shows just proximal to the MTP joint distally there is a hypoechoic change that is consistent with a cyst formation.  Mild surrounding hypoechoic changes in the soft tissue consistent with edema and swelling.  No abnormal vascularity noted. Impression: Soft tissue cyst noted likely benign  Procedure: Real-time Ultrasound Guided Injection of right fifth foot cyst likely ganglion Device: GE Logiq Q7 Ultrasound guided injection is preferred based studies that show increased duration, increased effect, greater accuracy, decreased procedural pain, increased response rate, and decreased cost with ultrasound guided versus blind injection.  Verbal informed consent obtained.  Time-out conducted.  Noted no overlying erythema, induration, or other signs of local infection.  Skin prepped in a sterile fashion.  Local anesthesia: Topical Ethyl chloride.  With sterile technique and under real time ultrasound guidance: With a 25-gauge half inch needle injected with 0.5 cc of 0.5% Marcaine and 0.5 cc of Kenalog 40  mg/mL Completed without difficulty  Pain immediately resolved suggesting accurate placement of the medication.  Advised to call if fevers/chills, erythema, induration, drainage, or persistent bleeding.  Impression: Technically successful ultrasound guided injection.   Impression and Recommendations:      The above documentation has been reviewed and is accurate and complete [name] Grell M Kaymen Adrian, DO

## 2024-08-10 ENCOUNTER — Ambulatory Visit: Admitting: Family Medicine

## 2024-08-10 ENCOUNTER — Other Ambulatory Visit: Payer: Self-pay

## 2024-08-10 VITALS — BP 126/84 | HR 68 | Ht 70.0 in | Wt 205.0 lb

## 2024-08-10 DIAGNOSIS — M79671 Pain in right foot: Secondary | ICD-10-CM | POA: Diagnosis not present

## 2024-08-10 DIAGNOSIS — M67471 Ganglion, right ankle and foot: Secondary | ICD-10-CM

## 2024-08-10 DIAGNOSIS — M79644 Pain in right finger(s): Secondary | ICD-10-CM

## 2024-08-10 MED ORDER — DOXYCYCLINE HYCLATE 100 MG PO TABS
100.0000 mg | ORAL_TABLET | Freq: Two times a day (BID) | ORAL | 0 refills | Status: AC
Start: 2024-08-10 — End: ?

## 2024-08-10 NOTE — Patient Instructions (Addendum)
 Injection in foot today Rigid sole shoe Doxycycline  only take if you notice redness See you again in 2-3 months

## 2024-08-11 ENCOUNTER — Encounter: Payer: Self-pay | Admitting: Family Medicine

## 2024-08-11 DIAGNOSIS — M67471 Ganglion, right ankle and foot: Secondary | ICD-10-CM | POA: Insufficient documentation

## 2024-08-11 NOTE — Assessment & Plan Note (Signed)
 Patient given injection and tolerated the procedure well, discussed icing regimen and home exercises, and discussed rigid soled shoes.  Given antibiotic with it being a holiday weekend in case of any potential side effects.  Discussed icing regimen.  Follow-up again in 6 to 12 weeks

## 2024-08-20 ENCOUNTER — Other Ambulatory Visit: Payer: Self-pay | Admitting: Internal Medicine

## 2024-08-25 DIAGNOSIS — K2270 Barrett's esophagus without dysplasia: Secondary | ICD-10-CM | POA: Diagnosis not present

## 2024-08-25 DIAGNOSIS — Z1211 Encounter for screening for malignant neoplasm of colon: Secondary | ICD-10-CM | POA: Diagnosis not present

## 2024-08-25 DIAGNOSIS — K219 Gastro-esophageal reflux disease without esophagitis: Secondary | ICD-10-CM | POA: Diagnosis not present

## 2024-08-25 DIAGNOSIS — K317 Polyp of stomach and duodenum: Secondary | ICD-10-CM | POA: Diagnosis not present

## 2024-10-07 NOTE — Progress Notes (Signed)
 " [facility] [address] [place] [address] Phone: [phone] Subjective:   ISusannah Gully, am serving as a scribe for Dr. Arthea Claudene.  I'm seeing this patient by the request  of:  Geofm Glade PARAS, MD  CC: Right hand and right foot pain  YEP:Dlagzrupcz  08/10/2024 Patient given injection and tolerated the procedure well, discussed icing regimen and home exercises, and discussed rigid soled shoes.  Given antibiotic with it being a holiday weekend in case of any potential side effects.  Discussed icing regimen.  Follow-up again in 6 to 12 weeks     Update 10/11/2024 AMROM ORE is a 74 y.o. male coming in with complaint of R hand and R foot pain.  Patient had what appeared to be more of a ganglion cyst.  Had injection in the cyst at last exam greater than 2 months ago.  Patient states r hand is doing okay, still feels a bit swollen.  Foot is much better, but a little red.      Past Medical History:  Diagnosis Date   Hyperlipidemia    LDL goal = < 110   Hypertension    Prostate cancer [facility]) 2008   Dr Charles, [facility]   Past Surgical History:  Procedure Laterality Date   CERVICAL FUSION  2007   C5-C7 , Dr Onetha    COLONOSCOPY  2006   negative; [place], [place]   ESOPHAGOGASTRODUODENOSCOPY Left 10/28/2015   Procedure: ESOPHAGOGASTRODUODENOSCOPY (EGD);  Surgeon: Norleen Hint, MD;  Location: [facility];  Service: Endoscopy;  Laterality: Left;   PROSTATECTOMY  11/2007   Dr.Moul, [facility]   WRIST SURGERY  1998   [place]ate bone resection 2006   Social History   Socioeconomic History   Marital status: Married    Spouse name: Not on file   Number of children: 3   Years of education: Not on file   Highest education level: Not on file  Occupational History   Occupation: Teacher/Coach  Tobacco Use   Smoking status: Never    Passive exposure: Never   Smokeless tobacco: Never  Substance and Sexual Activity   Alcohol use: No   Drug use: No   Sexual  activity: Yes  Other Topics Concern   Not on file  Social History Narrative   Married   Social Drivers of Health   Tobacco Use: Low Risk (08/11/2024)   Patient History    Smoking Tobacco Use: Never    Smokeless Tobacco Use: Never    Passive Exposure: Never  Financial Resource Strain: Low Risk (02/04/2024)   Overall Financial Resource Strain (CARDIA)    Difficulty of Paying Living Expenses: Not hard at all  Food Insecurity: No Food Insecurity (02/04/2024)   Hunger Vital Sign    Worried About Running Out of Food in the Last Year: Never true    Ran Out of Food in the Last Year: Never true  Transportation Needs: No Transportation Needs (02/04/2024)   PRAPARE - Administrator, Civil Service (Medical): No    Lack of Transportation (Non-Medical): No  Physical Activity: Sufficiently Active (02/04/2024)   Exercise Vital Sign    Days of Exercise per Week: 3 days    Minutes of Exercise per Session: 60 min  Stress: No Stress Concern Present (02/04/2024)   [employer] of Occupational Health - Occupational Stress Questionnaire    Feeling of Stress : Not at all  Social Connections: Socially Integrated (02/04/2024)   Social Connection and  Isolation Panel    Frequency of Communication with Friends and Family: More than three times a week    Frequency of Social Gatherings with Friends and Family: More than three times a week    Attends Religious Services: More than 4 times per year    Active Member of Clubs or Organizations: Yes    Attends Banker Meetings: More than 4 times per year    Marital Status: Married  Depression (PHQ2-9): Low Risk (06/30/2024)   Depression (PHQ2-9)    PHQ-2 Score: 0  Alcohol Screen: Low Risk (02/04/2024)   Alcohol Screen    Last Alcohol Screening Score (AUDIT): 0  Housing: Unknown (02/04/2024)   Housing Stability Vital Sign    Unable to Pay for Housing in the Last Year: No    Number of Times Moved in the Last Year: Not on file     Homeless in the Last Year: No  Utilities: Not At Risk (02/04/2024)   AHC Utilities    Threatened with loss of utilities: No  Health Literacy: Adequate Health Literacy (02/04/2024)   B1300 Health Literacy    Frequency of need for help with medical instructions: Never   Allergies[1] Family History  Problem Relation Age of Onset   Breast cancer Mother    Alzheimer's disease Mother    Colon cancer Paternal Grandmother    Diabetes Paternal Grandfather    Heart attack Paternal Grandfather 64   Breast cancer Maternal Grandmother    Heart attack Maternal Grandfather 72   Heart disease Father        Atrial fibrillation   Heart failure Father    Stroke Neg Hx     Current Outpatient Medications (Endocrine & Metabolic):    glimepiride  (AMARYL ) 4 MG tablet, Take 1 tablet (4 mg total) by mouth daily before breakfast.   JARDIANCE  25 MG TABS tablet, TAKE 1 TABLET BY MOUTH DAILY BEFORE BREAKFAST.   metFORMIN  (GLUCOPHAGE -XR) 500 MG 24 hr tablet, TAKE 3 TABLETS (1,500 MG TOTAL) BY MOUTH DAILY WITH BREAKFAST  Current Outpatient Medications (Cardiovascular):    hydrochlorothiazide  (HYDRODIURIL ) 25 MG tablet, TAKE 1 TABLET (25 MG TOTAL) BY MOUTH DAILY.   losartan  (COZAAR ) 100 MG tablet, TAKE 1 TABLET BY MOUTH EVERY DAY   rosuvastatin  (CRESTOR ) 5 MG tablet, TAKE 1 TABLET (5 MG TOTAL) BY MOUTH DAILY.  Current Outpatient Medications (Analgesics):    meloxicam  (MOBIC ) 15 MG tablet, Take 1 tablet (15 mg total) by mouth daily.  Current Outpatient Medications (Other):    b complex vitamins tablet, Take 1 tablet by mouth daily.   Cholecalciferol (VITAMIN D3) 50 MCG (2000 UT) capsule, Take by mouth.   doxycycline  (VIBRA -TABS) 100 MG tablet, Take 1 tablet (100 mg total) by mouth 2 (two) times daily.   ketoconazole  (NIZORAL ) 2 % cream, APPLY 1 APPLICATION TOPICALLY DAILY   MegaRed Omega-3 Krill Oil 500 MG CAPS, as directed Orally   metroNIDAZOLE (METROCREAM) 0.75 % cream,    pantoprazole  (PROTONIX ) 40 MG  tablet, Take 1 tablet (40 mg total) by mouth daily.   Sulfacetamide Sodium-Sulfur 9.8-4.8 % LIQD,    Objective  Blood pressure 124/76, pulse 75, height 5' 10 (1.778 m), weight 212 lb (96.2 kg), SpO2 97%.   General: No apparent distress alert and oriented x3 mood and affect normal, dressed appropriately.  HEENT: Pupils equal, extraocular movements intact  Respiratory: Patient's speak in full sentences and does not appear short of breath  Cardiovascular: No lower extremity edema, non tender, no erythema  Foot exam  shows very mild redness noted on the base of the fifth phalanx on the toe.  Nontender on exam.  Patient does have significant improvement in range of motion of the middle finger as well.  .Limited muscular skeletal ultrasound was performed and interpreted by CLAUDENE HUSSAR, M  Limited ultrasound of the finger shows no hypoechoic changes in the joint itself.  Cortex does have a strong hard callus noted today. Foot shows that the ganglion cyst is completely gone at this moment.  No abnormal vascularity.    Impression and Recommendations:    The above documentation has been reviewed and is accurate and complete Satya Bohall M Ahren Pettinger, DO        [1] No Known Allergies  "

## 2024-10-11 ENCOUNTER — Ambulatory Visit: Admitting: Family Medicine

## 2024-10-11 ENCOUNTER — Encounter: Payer: Self-pay | Admitting: Family Medicine

## 2024-10-11 ENCOUNTER — Other Ambulatory Visit: Payer: Self-pay

## 2024-10-11 VITALS — BP 124/76 | HR 75 | Ht 70.0 in | Wt 212.0 lb

## 2024-10-11 DIAGNOSIS — M79644 Pain in right finger(s): Secondary | ICD-10-CM

## 2024-10-11 DIAGNOSIS — S63212A Subluxation of metacarpophalangeal joint of right middle finger, initial encounter: Secondary | ICD-10-CM

## 2024-10-11 DIAGNOSIS — M67471 Ganglion, right ankle and foot: Secondary | ICD-10-CM

## 2024-10-11 DIAGNOSIS — S63219A Subluxation of metacarpophalangeal joint of unspecified finger, initial encounter: Secondary | ICD-10-CM

## 2024-10-11 NOTE — Patient Instructions (Signed)
 Things look great Nothing to change See you again when you need me

## 2024-10-11 NOTE — Assessment & Plan Note (Addendum)
 Right middle MCP All done at this time.  Believe the patient will do fine.  No other changes necessary.

## 2024-10-11 NOTE — Assessment & Plan Note (Signed)
 Overall significant improvement noted.  No significant erythema that makes me concerned for any type of infectious etiology.  Patient is nontender on exam today.  Discussed continuing to monitor for some of the redness as well as using more lotion at this time.  Follow-up with me again in 6 to 8 weeks otherwise or as needed

## 2024-12-30 ENCOUNTER — Ambulatory Visit: Admitting: Internal Medicine

## 2025-02-07 ENCOUNTER — Ambulatory Visit
